# Patient Record
Sex: Female | Born: 1990 | Race: Black or African American | Hispanic: No | Marital: Single | Smoking: Former smoker
Health system: Southern US, Community
[De-identification: ages and names within clinical notes are randomized; demographics above are authoritative.]

## PROBLEM LIST (undated history)

## (undated) ENCOUNTER — Inpatient Hospital Stay (HOSPITAL_COMMUNITY): Payer: Self-pay

## (undated) DIAGNOSIS — Z87820 Personal history of traumatic brain injury: Secondary | ICD-10-CM

## (undated) DIAGNOSIS — E039 Hypothyroidism, unspecified: Secondary | ICD-10-CM

## (undated) DIAGNOSIS — F32A Depression, unspecified: Secondary | ICD-10-CM

## (undated) DIAGNOSIS — F329 Major depressive disorder, single episode, unspecified: Secondary | ICD-10-CM

## (undated) DIAGNOSIS — E042 Nontoxic multinodular goiter: Secondary | ICD-10-CM

## (undated) DIAGNOSIS — F419 Anxiety disorder, unspecified: Secondary | ICD-10-CM

## (undated) DIAGNOSIS — R0600 Dyspnea, unspecified: Secondary | ICD-10-CM

## (undated) DIAGNOSIS — Z8759 Personal history of other complications of pregnancy, childbirth and the puerperium: Secondary | ICD-10-CM

## (undated) HISTORY — DX: Major depressive disorder, single episode, unspecified: F32.9

## (undated) HISTORY — DX: Anxiety disorder, unspecified: F41.9

## (undated) HISTORY — DX: Depression, unspecified: F32.A

---
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
## 2012-11-17 ENCOUNTER — Emergency Department (HOSPITAL_COMMUNITY)
Admission: EM | Admit: 2012-11-17 | Discharge: 2012-11-17 | Payer: No Typology Code available for payment source | Attending: Emergency Medicine | Admitting: Emergency Medicine

## 2012-11-17 ENCOUNTER — Encounter (HOSPITAL_COMMUNITY): Payer: Self-pay | Admitting: Cardiology

## 2012-11-17 DIAGNOSIS — T7421XA Adult sexual abuse, confirmed, initial encounter: Secondary | ICD-10-CM | POA: Insufficient documentation

## 2012-11-17 DIAGNOSIS — IMO0002 Reserved for concepts with insufficient information to code with codable children: Secondary | ICD-10-CM | POA: Insufficient documentation

## 2012-11-17 MED ORDER — METRONIDAZOLE 500 MG PO TABS
ORAL_TABLET | ORAL | Status: AC
  Administered 2012-11-17: 2000 mg
  Filled 2012-11-17: qty 4

## 2012-11-17 MED ORDER — LEVONORGESTREL 0.75 MG PO TABS
ORAL_TABLET | ORAL | Status: AC
  Administered 2012-11-17: 0.75 mg
  Filled 2012-11-17: qty 2

## 2012-11-17 MED ORDER — AZITHROMYCIN 1 G PO PACK
PACK | ORAL | Status: AC
  Administered 2012-11-17: 1 g
  Filled 2012-11-17: qty 1

## 2012-11-17 MED ORDER — PROMETHAZINE HCL 25 MG PO TABS
ORAL_TABLET | ORAL | Status: AC
  Filled 2012-11-17: qty 3

## 2012-11-17 MED ORDER — CEFIXIME 400 MG PO TABS
ORAL_TABLET | ORAL | Status: AC
  Administered 2012-11-17: 400 mg
  Filled 2012-11-17: qty 1

## 2012-11-17 NOTE — ED Notes (Signed)
SANE nurse at bedside.
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
## 2012-11-17 NOTE — ED Provider Notes (Signed)
Medical screening examination/treatment/procedure(s) were performed by non-physician practitioner and as supervising physician I was immediately available for consultation/collaboration.  Oluwasemilore Bahl R. Kelin Borum, MD 11/17/12 2347 
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
## 2013-02-28 ENCOUNTER — Encounter: Payer: Self-pay | Admitting: Family Medicine

## 2013-02-28 ENCOUNTER — Ambulatory Visit (INDEPENDENT_AMBULATORY_CARE_PROVIDER_SITE_OTHER): Payer: Medicaid Other | Admitting: Family Medicine

## 2013-02-28 ENCOUNTER — Other Ambulatory Visit (HOSPITAL_COMMUNITY)
Admission: RE | Admit: 2013-02-28 | Discharge: 2013-02-28 | Disposition: A | Discharge status: Home / Self Care | Payer: Medicaid Other | Source: Ambulatory Visit | Attending: Family Medicine | Admitting: Family Medicine

## 2013-02-28 VITALS — BP 113/77 | HR 80 | Ht 64.0 in | Wt 189.0 lb

## 2013-02-28 DIAGNOSIS — R079 Chest pain, unspecified: Secondary | ICD-10-CM

## 2013-02-28 DIAGNOSIS — Z7251 High risk heterosexual behavior: Secondary | ICD-10-CM

## 2013-02-28 DIAGNOSIS — Z113 Encounter for screening for infections with a predominantly sexual mode of transmission: Secondary | ICD-10-CM | POA: Insufficient documentation

## 2013-02-28 DIAGNOSIS — R197 Diarrhea, unspecified: Secondary | ICD-10-CM | POA: Insufficient documentation

## 2013-02-28 DIAGNOSIS — IMO0001 Reserved for inherently not codable concepts without codable children: Secondary | ICD-10-CM

## 2013-02-28 DIAGNOSIS — Z202 Contact with and (suspected) exposure to infections with a predominantly sexual mode of transmission: Secondary | ICD-10-CM

## 2013-02-28 DIAGNOSIS — R1013 Epigastric pain: Secondary | ICD-10-CM

## 2013-02-28 DIAGNOSIS — N926 Irregular menstruation, unspecified: Secondary | ICD-10-CM | POA: Insufficient documentation

## 2013-02-28 DIAGNOSIS — Z9189 Other specified personal risk factors, not elsewhere classified: Secondary | ICD-10-CM

## 2013-02-28 DIAGNOSIS — Z309 Encounter for contraceptive management, unspecified: Secondary | ICD-10-CM

## 2013-02-28 LAB — POCT URINE PREGNANCY: Preg Test, Ur: NEGATIVE

## 2013-02-28 NOTE — Patient Instructions (Addendum)
Please return for fasting lab tests  Return in one week for another pregnancy test and your depo shot.  Take Imodium before you go to bed and after loose bowel movements

## 2013-02-28 NOTE — Assessment & Plan Note (Signed)
Not suggestive of peptic disease

## 2013-02-28 NOTE — Assessment & Plan Note (Signed)
Likely musculoskeletal. 

## 2013-02-28 NOTE — Progress Notes (Signed)
  Subjective:    Patient ID: Taylor Carson, female    DOB: 13-Jun-1991, 22 y.o.   MRN: [id number]  HPI Contraception - She'd like to restart Depo having taken it before without complications. Has been a relationship for 4 months and is planning on getting an apartment with him. They've been using condoms for contraception. She denies vaginal discharge or pelvic symptoms. She does have regular menses lasting 4-5 days with some menstrual cramps. LMP was the first week in May and they come about every 30 days.   Abdominal pain - epigastric sharp pains lasting less than one minute. Not associated with eating. Started about 4 weeks ago.   Diarrhea - Started 3 weeks ago with watery, normal colored bowel movements 3 times daily with the first awakening her at 4-5 AM. Second comes after breakfast. No ill contacts or foreign travel. She is not taking medicine for it. She is under financial stress since her mother is moving and she has to find her own place. 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   chest pain - sharp right upper CC junction discomfort not associated with the running and walking that she's been doing to lose weight. Did try push-ups x 1. No cough or shortness of breath. She's gained weight recently and is trying to lose it.    Review of Systems     Objective:   Physical Exam  Constitutional:  Generalized obesity   Neck: No thyromegaly present.  Cardiovascular: Normal rate and regular rhythm.   No murmur heard. Pulmonary/Chest: Effort normal and breath sounds normal.  Abdominal: Soft. Bowel sounds are normal. She exhibits no distension and no mass. There is no tenderness. There is no rebound and no guarding.  Neurological: She is alert.  Psychiatric: She has a normal mood and affect. Her behavior is normal. Judgment and thought content normal.          Assessment & Plan:

## 2013-02-28 NOTE — Assessment & Plan Note (Signed)
Too prolonged for viral Will see CMET results. Imodium prn

## 2013-02-28 NOTE — Assessment & Plan Note (Signed)
Begin Depo Provera next week if the repeat pregnancy test is negative.

## 2013-03-02 ENCOUNTER — Encounter: Payer: Self-pay | Admitting: Family Medicine

## 2013-03-07 ENCOUNTER — Ambulatory Visit: Payer: Medicaid Other

## 2013-06-05 ENCOUNTER — Ambulatory Visit: Payer: Medicaid Other

## 2013-07-24 ENCOUNTER — Ambulatory Visit: Payer: Medicaid Other | Admitting: Family Medicine

## 2014-05-10 ENCOUNTER — Encounter (HOSPITAL_COMMUNITY): Payer: Self-pay | Admitting: Emergency Medicine

## 2014-05-10 ENCOUNTER — Emergency Department (HOSPITAL_COMMUNITY): Payer: Medicaid Other

## 2014-05-10 ENCOUNTER — Emergency Department (HOSPITAL_COMMUNITY)
Admission: EM | Admit: 2014-05-10 | Discharge: 2014-05-10 | Disposition: A | Discharge status: Home / Self Care | Payer: Medicaid Other | Attending: Emergency Medicine | Admitting: Emergency Medicine

## 2014-05-10 DIAGNOSIS — F17200 Nicotine dependence, unspecified, uncomplicated: Secondary | ICD-10-CM | POA: Insufficient documentation

## 2014-05-10 DIAGNOSIS — R091 Pleurisy: Secondary | ICD-10-CM | POA: Insufficient documentation

## 2014-05-10 DIAGNOSIS — M25519 Pain in unspecified shoulder: Secondary | ICD-10-CM | POA: Insufficient documentation

## 2014-05-10 MED ORDER — NAPROXEN 500 MG PO TABS: 500.0000 mg | ORAL_TABLET | Freq: Two times a day (BID) | ORAL | Status: DC

## 2014-05-10 NOTE — ED Notes (Signed)
Per pt sts she started having sharp pains in her left shoulder/chest about 1 hour ago. Sts the pain is there when she breathes in and out.

## 2014-05-10 NOTE — ED Provider Notes (Signed)
CSN: [id number]     Arrival date & time 05/10/14  1118 History   First MD Initiated Contact with Patient 05/10/14 1341     Chief Complaint  Patient presents with  . Shoulder Pain    HPI Pt had pain in her left shoulder blade earlier today.  It started suddenly.  Sharp in nature but now is much better.  It was radiating down to the elbow.  The pain increased breathing.  She took an aspirin.  No nausea.  No shortness of breath.  No history of heart disease.  No OCP meds.  No prior DVT or PE.   History reviewed. No pertinent past medical history. History reviewed. No pertinent past surgical history. History reviewed. No pertinent family history. History  Substance Use Topics  . Smoking status: Current Every Day Smoker  . Smokeless tobacco: Not on file  . Alcohol Use: Yes   OB History   Grav Para Term Preterm Abortions TAB SAB Ect Mult Living                 Review of Systems  All other systems reviewed and are negative.     Allergies  Review of patient's allergies indicates no known allergies.  Home Medications   Prior to Admission medications   Medication Sig Start Date End Date Taking? Authorizing Provider  naproxen (NAPROSYN) 500 MG tablet Take 1 tablet (500 mg total) by mouth 2 (two) times daily. 05/10/14   Linwood Dibbles, MD   BP 116/55  Pulse 72  Temp(Src) 97.7 F (36.5 C) (Oral)  Resp 17  SpO2 98%  LMP 05/10/2014 Physical Exam  Nursing note and vitals reviewed. Constitutional: She appears well-developed and well-nourished. No distress.  HENT:  Head: Normocephalic and atraumatic.  Right Ear: External ear normal.  Left Ear: External ear normal.  Eyes: Conjunctivae are normal. Right eye exhibits no discharge. Left eye exhibits no discharge. No scleral icterus.  Neck: Neck supple. No tracheal deviation present.  Cardiovascular: Normal rate, regular rhythm and intact distal pulses.   Pulmonary/Chest: Effort normal and breath sounds normal. No stridor. No respiratory  distress. She has no wheezes. She has no rales.  Abdominal: Soft. Bowel sounds are normal. She exhibits no distension. There is no tenderness. There is no rebound and no guarding.  Musculoskeletal: She exhibits no edema and no tenderness.  Neurological: She is alert. She has normal strength. No cranial nerve deficit (no facial droop, extraocular movements intact, no slurred speech) or sensory deficit. She exhibits normal muscle tone. She displays no seizure activity. Coordination normal.  Skin: Skin is warm and dry. No rash noted.  Psychiatric: She has a normal mood and affect.    ED Course  Procedures (including critical care time) Labs Review Labs Reviewed - No data to display  Imaging Review Dg Chest 2 View  05/10/2014   CLINICAL DATA:  Chest pain  EXAM: CHEST  2 VIEW  COMPARISON:  May 30, 2011  FINDINGS: There is no edema or consolidation. The heart size and pulmonary vascularity are normal. No pneumothorax. No adenopathy. No bone lesions.  IMPRESSION: No abnormality noted.   Electronically Signed   By: Bretta Bang M.D.   On: 05/10/2014 14:39     EKG Interpretation   Date/Time:  Thursday May 10 2014 11:46:51 EDT Ventricular Rate:  63 PR Interval:  152 QRS Duration: 88 QT Interval:  402 QTC Calculation: 411 R Axis:   77 Text Interpretation:  Normal sinus rhythm Normal ECG No significant  change  since last tracing Confirmed by Jadaya Sommerfield  MD-J, Alexas Basulto [phone]([id number]) on 05/10/2014  1:36:43 PM      MDM   Final diagnoses:  Pleurisy    Low risk for PE.  Perc negative.  May be viral pleurisy.  DC home with nsaids.  General precautions    Linwood DibblesJon Romain Erion, MD 05/10/14 1535

## 2014-05-10 NOTE — Discharge Instructions (Signed)

## 2014-11-15 ENCOUNTER — Encounter (HOSPITAL_COMMUNITY): Payer: Self-pay | Admitting: Emergency Medicine

## 2014-11-15 ENCOUNTER — Emergency Department (HOSPITAL_COMMUNITY): Payer: Medicaid Other

## 2014-11-15 ENCOUNTER — Emergency Department (HOSPITAL_COMMUNITY)
Admission: EM | Admit: 2014-11-15 | Discharge: 2014-11-15 | Disposition: A | Discharge status: Home / Self Care | Payer: Medicaid Other | Attending: Emergency Medicine | Admitting: Emergency Medicine

## 2014-11-15 DIAGNOSIS — J069 Acute upper respiratory infection, unspecified: Secondary | ICD-10-CM | POA: Insufficient documentation

## 2014-11-15 DIAGNOSIS — Z720 Tobacco use: Secondary | ICD-10-CM | POA: Insufficient documentation

## 2014-11-15 DIAGNOSIS — Z79899 Other long term (current) drug therapy: Secondary | ICD-10-CM | POA: Insufficient documentation

## 2014-11-15 DIAGNOSIS — H6091 Unspecified otitis externa, right ear: Secondary | ICD-10-CM | POA: Insufficient documentation

## 2014-11-15 MED ORDER — ANTIPYRINE-BENZOCAINE 5.4-1.4 % OT SOLN: 3.0000 [drp] | OTIC | Status: DC | PRN

## 2014-11-15 MED ORDER — SALINE SPRAY 0.65 % NA SOLN: 1.0000 | NASAL | Status: DC | PRN

## 2014-11-15 MED ORDER — NEOMYCIN-POLYMYXIN-HC 3.5-10000-1 OT SUSP: 4.0000 [drp] | Freq: Four times a day (QID) | OTIC | Status: DC

## 2014-11-15 NOTE — Discharge Instructions (Signed)
Use nasal saline as directed along with auralgan for ear pain and cortisporin ear drops for the infection of your right ear. Do not use Q-tips. Take over-the-counter decongestants.  Cool Mist Vaporizers Vaporizers may help relieve the symptoms of a cough and cold. They add moisture to the air, which helps mucus to become thinner and less sticky. This makes it easier to breathe and cough up secretions. Cool mist vaporizers do not cause serious burns like hot mist vaporizers, which may also be called steamers or humidifiers. Vaporizers have not been proven to help with colds. You should not use a vaporizer if you are allergic to mold. HOME CARE INSTRUCTIONS  Follow the package instructions for the vaporizer.  Do not use anything other than distilled water in the vaporizer.  Do not run the vaporizer all of the time. This can cause mold or bacteria to grow in the vaporizer.  Clean the vaporizer after each time it is used.  Clean and dry the vaporizer well before storing it.  Stop using the vaporizer if worsening respiratory symptoms develop. Document Released: 06/25/2004 Document Revised: 10/03/2013 Document Reviewed: 02/15/2013 [facility] Patient Information 2015 [place], [place]. This information is not intended to replace advice given to you by your health care provider. Make sure you discuss any questions you have with your health care provider.  Otitis Externa Otitis externa is a bacterial or fungal infection of the outer ear canal. This is the area from the eardrum to the outside of the ear. Otitis externa is sometimes called "swimmer's ear." CAUSES  Possible causes of infection include:  Swimming in dirty water.  Moisture remaining in the ear after swimming or bathing.  Mild injury (trauma) to the ear.  Objects stuck in the ear (foreign body).  Cuts or scrapes (abrasions) on the outside of the ear. SIGNS AND SYMPTOMS  The first symptom of infection is often itching in the ear  canal. Later signs and symptoms may include swelling and redness of the ear canal, ear pain, and yellowish-white fluid (pus) coming from the ear. The ear pain may be worse when pulling on the earlobe. DIAGNOSIS  Your health care provider will perform a physical exam. A sample of fluid may be taken from the ear and examined for bacteria or fungi. TREATMENT  Antibiotic ear drops are often given for 10 to 14 days. Treatment may also include pain medicine or corticosteroids to reduce itching and swelling. HOME CARE INSTRUCTIONS   Apply antibiotic ear drops to the ear canal as prescribed by your health care provider.  Take medicines only as directed by your health care provider.  If you have diabetes, follow any additional treatment instructions from your health care provider.  Keep all follow-up visits as directed by your health care provider. PREVENTION   Keep your ear dry. Use the corner of a towel to absorb water out of the ear canal after swimming or bathing.  Avoid scratching or putting objects inside your ear. This can damage the ear canal or remove the protective wax that lines the canal. This makes it easier for bacteria and fungi to grow.  Avoid swimming in lakes, polluted water, or poorly chlorinated pools.  You may use ear drops made of rubbing alcohol and vinegar after swimming. Combine equal parts of white vinegar and alcohol in a bottle. Put 3 or 4 drops into each ear after swimming. SEEK MEDICAL CARE IF:   You have a fever.  Your ear is still red, swollen, painful, or draining pus  after 3 days.  Your redness, swelling, or pain gets worse.  You have a severe headache.  You have redness, swelling, pain, or tenderness in the area behind your ear. MAKE SURE YOU:   Understand these instructions.  Will watch your condition.  Will get help right away if you are not doing well or get worse. Document Released: 09/28/2005 Document Revised: 02/12/2014 Document Reviewed:  10/15/2011 [facility] Patient Information 2015 [place], [place]. This information is not intended to replace advice given to you by your health care provider. Make sure you discuss any questions you have with your health care provider.  Upper Respiratory Infection, Adult An upper respiratory infection (URI) is also sometimes known as the common cold. The upper respiratory tract includes the nose, sinuses, throat, trachea, and bronchi. Bronchi are the airways leading to the lungs. Most people improve within 1 week, but symptoms can last up to 2 weeks. A residual cough may last even longer.  CAUSES Many different viruses can infect the tissues lining the upper respiratory tract. The tissues become irritated and inflamed and often become very moist. Mucus production is also common. A cold is contagious. You can easily spread the virus to others by oral contact. This includes kissing, sharing a glass, coughing, or sneezing. Touching your mouth or nose and then touching a surface, which is then touched by another person, can also spread the virus. SYMPTOMS  Symptoms typically develop 1 to 3 days after you come in contact with a cold virus. Symptoms vary from person to person. They may include:  Runny nose.  Sneezing.  Nasal congestion.  Sinus irritation.  Sore throat.  Loss of voice (laryngitis).  Cough.  Fatigue.  Muscle aches.  Loss of appetite.  Headache.  Low-grade fever. DIAGNOSIS  You might diagnose your own cold based on familiar symptoms, since most people get a cold 2 to 3 times a year. Your caregiver can confirm this based on your exam. Most importantly, your caregiver can check that your symptoms are not due to another disease such as strep throat, sinusitis, pneumonia, asthma, or epiglottitis. Blood tests, throat tests, and X-rays are not necessary to diagnose a common cold, but they may sometimes be helpful in excluding other more serious diseases. Your caregiver will decide if  any further tests are required. RISKS AND COMPLICATIONS  You may be at risk for a more severe case of the common cold if you smoke cigarettes, have chronic heart disease (such as heart failure) or lung disease (such as asthma), or if you have a weakened immune system. The very young and very old are also at risk for more serious infections. Bacterial sinusitis, middle ear infections, and bacterial pneumonia can complicate the common cold. The common cold can worsen asthma and chronic obstructive pulmonary disease (COPD). Sometimes, these complications can require emergency medical care and may be life-threatening. PREVENTION  The best way to protect against getting a cold is to practice good hygiene. Avoid oral or hand contact with people with cold symptoms. Wash your hands often if contact occurs. There is no clear evidence that vitamin C, vitamin E, echinacea, or exercise reduces the chance of developing a cold. However, it is always recommended to get plenty of rest and practice good nutrition. TREATMENT  Treatment is directed at relieving symptoms. There is no cure. Antibiotics are not effective, because the infection is caused by a virus, not by bacteria. Treatment may include:  Increased fluid intake. Sports drinks offer valuable electrolytes, sugars, and fluids.  Breathing heated mist or steam (vaporizer or shower).  Eating chicken soup or other clear broths, and maintaining good nutrition.  Getting plenty of rest.  Using gargles or lozenges for comfort.  Controlling fevers with ibuprofen or acetaminophen as directed by your caregiver.  Increasing usage of your inhaler if you have asthma. Zinc gel and zinc lozenges, taken in the first 24 hours of the common cold, can shorten the duration and lessen the severity of symptoms. Pain medicines may help with fever, muscle aches, and throat pain. A variety of non-prescription medicines are available to treat congestion and runny nose. Your  caregiver can make recommendations and may suggest nasal or lung inhalers for other symptoms.  HOME CARE INSTRUCTIONS   Only take over-the-counter or prescription medicines for pain, discomfort, or fever as directed by your caregiver.  Use a warm mist humidifier or inhale steam from a shower to increase air moisture. This may keep secretions moist and make it easier to breathe.  Drink enough water and fluids to keep your urine clear or pale yellow.  Rest as needed.  Return to work when your temperature has returned to normal or as your caregiver advises. You may need to stay home longer to avoid infecting others. You can also use a face mask and careful hand washing to prevent spread of the virus. SEEK MEDICAL CARE IF:   After the first few days, you feel you are getting worse rather than better.  You need your caregiver's advice about medicines to control symptoms.  You develop chills, worsening shortness of breath, or brown or red sputum. These may be signs of pneumonia.  You develop yellow or brown nasal discharge or pain in the face, especially when you bend forward. These may be signs of sinusitis.  You develop a fever, swollen neck glands, pain with swallowing, or white areas in the back of your throat. These may be signs of strep throat. SEEK IMMEDIATE MEDICAL CARE IF:   You have a fever.  You develop severe or persistent headache, ear pain, sinus pain, or chest pain.  You develop wheezing, a prolonged cough, cough up blood, or have a change in your usual mucus (if you have chronic lung disease).  You develop sore muscles or a stiff neck. Document Released: 03/24/2001 Document Revised: 12/21/2011 Document Reviewed: 01/03/2014 [facility] Patient Information 2015 [place], [place]. This information is not intended to replace advice given to you by your health care provider. Make sure you discuss any questions you have with your health care provider.

## 2014-11-15 NOTE — ED Notes (Signed)
Pt sts URI sx with cough x several weeks worse today with right ear pain and some blood in sputum with cough

## 2014-11-15 NOTE — ED Provider Notes (Signed)
CSN: [id number]     Arrival date & time 11/15/14  1731 History   First MD Initiated Contact with Patient 11/15/14 1807     Chief Complaint  Patient presents with  . URI  . Otalgia     (Consider location/radiation/quality/duration/timing/severity/associated sxs/prior Treatment) HPI Comments: 24 y/o F presenting with cough and nasal congestion x 4 days. Cough is productive with bloody sputum. Denies CP or SOB. States she is congested. Denies fever or chills. She also endorses R ear pain x 1 day unrelieved by OTC ear pain drops. Denies drainage.  Patient is a 24 y.o. female presenting with URI and ear pain. The history is provided by the patient.  URI Presenting symptoms: congestion, cough and ear pain   Otalgia Associated symptoms: congestion and cough     History reviewed. No pertinent past medical history. History reviewed. No pertinent past surgical history. History reviewed. No pertinent family history. History  Substance Use Topics  . Smoking status: Current Every Day Smoker  . Smokeless tobacco: Not on file  . Alcohol Use: Yes   OB History    No data available     Review of Systems  HENT: Positive for congestion and ear pain.   Respiratory: Positive for cough.   All other systems reviewed and are negative.     Allergies  Review of patient's allergies indicates no known allergies.  Home Medications   Prior to Admission medications   Medication Sig Start Date End Date Taking? Authorizing Provider  antipyrine-benzocaine Lyla Son) otic solution Place 3-4 drops into the right ear every 2 (two) hours as needed for ear pain. 11/15/14   Kathrynn Speed, PA-C  naproxen (NAPROSYN) 500 MG tablet Take 1 tablet (500 mg total) by mouth 2 (two) times daily. 05/10/14   Linwood Dibbles, MD  neomycin-polymyxin-hydrocortisone (CORTISPORIN) 3.5-10000-1 otic suspension Place 4 drops into the right ear 4 (four) times daily. X 7 days 11/15/14   Kathrynn Speed, PA-C  sodium chloride (OCEAN) 0.65 %  SOLN nasal spray Place 1 spray into both nostrils as needed for congestion. 11/15/14   Maja Mccaffery M Farris Blash, PA-C   BP 136/67 mmHg  Pulse 103  Temp(Src) 98.2 F (36.8 C) (Oral)  Resp 16  SpO2 99%  LMP 11/05/2014 Physical Exam  Constitutional: She is oriented to person, place, and time. She appears well-developed and well-nourished. No distress.  HENT:  Head: Normocephalic and atraumatic.  R ear canal inflamed. TM normal. L ear canal and TM normal. Nasal mucosal edema, dried blood in nares. Post nasal drip.  Eyes: Conjunctivae and EOM are normal.  Neck: Normal range of motion. Neck supple.  Cardiovascular: Normal rate, regular rhythm and normal heart sounds.   Pulmonary/Chest: Effort normal and breath sounds normal. No respiratory distress.  Musculoskeletal: Normal range of motion. She exhibits no edema.  Lymphadenopathy:    She has no cervical adenopathy.  Neurological: She is alert and oriented to person, place, and time. No sensory deficit.  Skin: Skin is warm and dry.  Psychiatric: She has a normal mood and affect. Her behavior is normal.  Nursing note and vitals reviewed.   ED Course  Procedures (including critical care time) Labs Review Labs Reviewed - No data to display  Imaging Review Dg Chest 2 View (if Patient Has Fever And/or Copd)  11/15/2014   CLINICAL DATA:  Duct of cough for 1 week.  Hemoptysis.  EXAM: CHEST  2 VIEW  COMPARISON:  May 10, 2014.  FINDINGS: The heart size and mediastinal  contours are within normal limits. Both lungs are clear. No pneumothorax or pleural effusion is noted. The visualized skeletal structures are unremarkable.  IMPRESSION: No acute cardiopulmonary abnormality seen.   Electronically Signed   By: Roque LiasJames  Green M.D.   On: 11/15/2014 18:32     EKG Interpretation None      MDM   Final diagnoses:  Otitis externa, right  URI (upper respiratory infection)   Pt non-toxic appearing and in NAD. AFVSS. No tachycardia on my exam. CXR obtained in  triage prior to pt being seen. No acute findings. Bloody sputum most likely from nasal congestion and dried blood in nares. Lungs clear on exam. Treat with nasal saline, cool-mist humidifiers. ROE tx with cortisporin drops. F/u with PCP. Stable for d/c. Return precautions given. Patient states understanding of treatment care plan and is agreeable.    Kathrynn SpeedRobyn M Makyla Bye, PA-C 11/15/14 Carlis [id number]  Gerhard Munchobert Lockwood, MD 11/15/14 2016

## 2015-05-24 ENCOUNTER — Encounter: Payer: Self-pay | Admitting: Obstetrics and Gynecology

## 2015-05-24 ENCOUNTER — Ambulatory Visit (INDEPENDENT_AMBULATORY_CARE_PROVIDER_SITE_OTHER): Payer: 59 | Admitting: Obstetrics and Gynecology

## 2015-05-24 VITALS — BP 145/80 | HR 86 | Temp 98.4°F | Wt 226.0 lb

## 2015-05-24 DIAGNOSIS — L03818 Cellulitis of other sites: Secondary | ICD-10-CM

## 2015-05-24 MED ORDER — CLINDAMYCIN HCL 300 MG PO CAPS: 300.0000 mg | ORAL_CAPSULE | Freq: Three times a day (TID) | ORAL | Status: DC

## 2015-05-24 NOTE — Patient Instructions (Signed)

## 2015-05-24 NOTE — Progress Notes (Signed)
   Subjective:   Patient ID: Taylor Carson, female    DOB: 11/18/1990, 24 y.o.   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   Past medical history, surgical, family, and social history reviewed and updated in the EMR as appropriate.  Objective:  BP 145/80 mmHg  Pulse 86  Temp(Src) 98.4 F (36.9 C) (Oral)  Wt 226 lb (102.513 kg)  Physical Exam  Constitutional: She is well-developed, well-nourished, and in no distress.  Skin:       Assessment & Plan:  Cellulitis of R thigh - concern for cellulitis with warmth, swelling, and erythema along with the rapid expansion of area in the last 2 days. Patient afebrile and vitals are stable.   -Rx for clindamycin given -area outlined and patient instructed to watch for expansion of borders -handout given -return precautions discussed -RTC in about 2 weeks to see if areas has resolved   Caryl Ada, DO 05/24/2015, 3:35 PM PGY-2, [facility]

## 2015-05-27 ENCOUNTER — Encounter: Payer: Self-pay | Admitting: Family Medicine

## 2015-05-27 ENCOUNTER — Ambulatory Visit (INDEPENDENT_AMBULATORY_CARE_PROVIDER_SITE_OTHER): Payer: 59 | Admitting: Family Medicine

## 2015-05-27 ENCOUNTER — Other Ambulatory Visit (HOSPITAL_COMMUNITY)
Admission: RE | Admit: 2015-05-27 | Discharge: 2015-05-27 | Disposition: A | Discharge status: Home / Self Care | Payer: 59 | Source: Ambulatory Visit | Attending: Family Medicine | Admitting: Family Medicine

## 2015-05-27 VITALS — BP 139/68 | HR 80 | Temp 98.2°F | Wt 226.0 lb

## 2015-05-27 DIAGNOSIS — Z124 Encounter for screening for malignant neoplasm of cervix: Secondary | ICD-10-CM | POA: Diagnosis not present

## 2015-05-27 DIAGNOSIS — Z113 Encounter for screening for infections with a predominantly sexual mode of transmission: Secondary | ICD-10-CM | POA: Diagnosis present

## 2015-05-27 DIAGNOSIS — Z01419 Encounter for gynecological examination (general) (routine) without abnormal findings: Secondary | ICD-10-CM | POA: Insufficient documentation

## 2015-05-27 DIAGNOSIS — Z1151 Encounter for screening for human papillomavirus (HPV): Secondary | ICD-10-CM | POA: Diagnosis present

## 2015-05-27 DIAGNOSIS — N898 Other specified noninflammatory disorders of vagina: Secondary | ICD-10-CM

## 2015-05-27 DIAGNOSIS — F17200 Nicotine dependence, unspecified, uncomplicated: Secondary | ICD-10-CM | POA: Insufficient documentation

## 2015-05-27 DIAGNOSIS — F17210 Nicotine dependence, cigarettes, uncomplicated: Secondary | ICD-10-CM

## 2015-05-27 DIAGNOSIS — Z3042 Encounter for surveillance of injectable contraceptive: Secondary | ICD-10-CM | POA: Diagnosis not present

## 2015-05-27 DIAGNOSIS — N760 Acute vaginitis: Secondary | ICD-10-CM

## 2015-05-27 DIAGNOSIS — B9689 Other specified bacterial agents as the cause of diseases classified elsewhere: Secondary | ICD-10-CM | POA: Insufficient documentation

## 2015-05-27 LAB — POCT URINE PREGNANCY: PREG TEST UR: NEGATIVE

## 2015-05-27 LAB — POCT WET PREP (WET MOUNT): Clue Cells Wet Prep Whiff POC: POSITIVE

## 2015-05-27 MED ORDER — MEDROXYPROGESTERONE ACETATE 150 MG/ML IM SUSP
150.0000 mg | Freq: Once | INTRAMUSCULAR | Status: AC
  Administered 2015-05-27: 150 mg via INTRAMUSCULAR

## 2015-05-27 MED ORDER — NICOTINE POLACRILEX 2 MG MT GUM: 2.0000 mg | CHEWING_GUM | OROMUCOSAL | Status: DC | PRN

## 2015-05-27 NOTE — Assessment & Plan Note (Signed)
Patient had vaginal irritation one month ago and requests STD testing. -wet prep, GC/Chlam, HIV, RPR, Hepatitis panel completed and results pending at discharge from office

## 2015-05-27 NOTE — Assessment & Plan Note (Signed)
Patient interested in smoking cessation -prescription for nicotine gum provided to the patient -return in one month for follow up

## 2015-05-27 NOTE — Assessment & Plan Note (Signed)
Patient presents for contraception management. Negative pregnancy test. -Depo-Provera provided -Patient to return in 3 months for repeat injection

## 2015-05-27 NOTE — Progress Notes (Signed)
   Subjective:    Patient ID: Taylor Carson, female    DOB: [birth year], 24 y.o.   MRN: [id number]  HPI 24 year old female presents for birth control counseling. She is interested in restarting Depo-Provera.  Not interested in Nexplanon or IUD, thinks she will forget to take OCP's, no history of blood clots, no family history of blood clots. Unsure of LMP however sometime in the middle of July.   Vaginal itching - some discharge one month ago, no current symptoms, no dysuria, no stomach pain, no nausea/emesis, would like STD check, last sexual activity was yesterday (female partner), has been with current partner for 4 months, uses condoms every time  Social - smokes 4-5 per day, interested in cutting down, has tried cold [place]  Review of Systems No fevers or chills    Objective:   Physical Exam Vitals: Reviewed Cardiac: Regular rate and rhythm, S1 and S2 present, no murmurs Respiratory: Clear to patient bilaterally, normal effort GU: Chaperone present, normal external female genitalia, speculum examination identified mild amount of white vaginal mucus, wet prep and GC chlamydia obtained. Pap smear also completed. Cervix appear normal. Bimanual exam was unremarkable.  Point-of-care pregnancy test negative     Assessment & Plan:  Please see problem specific assessment and plan.

## 2015-05-27 NOTE — Patient Instructions (Signed)
It was nice to see you today.  Dr. Randolm Idol will call you with your test results.  Congratulations on choosing to stop smoking. Dr. Randolm Idol has sent a prescription for nicotine gum to the pharmacy. Use the gum when you would normally smoke.  Please follow up in one month to follow up on your smoking.

## 2015-05-28 ENCOUNTER — Telehealth: Payer: Self-pay | Admitting: Family Medicine

## 2015-05-28 DIAGNOSIS — B9689 Other specified bacterial agents as the cause of diseases classified elsewhere: Secondary | ICD-10-CM

## 2015-05-28 DIAGNOSIS — Z0000 Encounter for general adult medical examination without abnormal findings: Secondary | ICD-10-CM

## 2015-05-28 DIAGNOSIS — N760 Acute vaginitis: Principal | ICD-10-CM

## 2015-05-28 LAB — ACUTE HEP PANEL AND HEP B SURFACE AB
HCV Ab: NEGATIVE
HEP B C IGM: NONREACTIVE
HEP B S AB: POSITIVE — AB
HEP B S AG: NEGATIVE
Hep A IgM: NONREACTIVE

## 2015-05-28 LAB — CERVICOVAGINAL ANCILLARY ONLY
Chlamydia: NEGATIVE
Neisseria Gonorrhea: NEGATIVE

## 2015-05-28 LAB — HIV ANTIBODY (ROUTINE TESTING W REFLEX): HIV: NONREACTIVE

## 2015-05-28 LAB — RPR

## 2015-05-28 LAB — CYTOLOGY - PAP

## 2015-05-28 NOTE — Telephone Encounter (Signed)
Attempted to contact patient on her cell phone with no answer.

## 2015-05-29 NOTE — Telephone Encounter (Signed)
Attempted to contact patient, no answer.

## 2015-05-30 NOTE — Telephone Encounter (Signed)
Pt is returning Dr. Fletke's call. jw °

## 2015-05-30 NOTE — Telephone Encounter (Signed)
Attempted to contact patient with results, left message to return call

## 2015-05-31 ENCOUNTER — Encounter: Payer: Self-pay | Admitting: Family Medicine

## 2015-05-31 DIAGNOSIS — Z0000 Encounter for general adult medical examination without abnormal findings: Secondary | ICD-10-CM | POA: Insufficient documentation

## 2015-05-31 MED ORDER — METRONIDAZOLE 500 MG PO TABS: 500.0000 mg | ORAL_TABLET | Freq: Two times a day (BID) | ORAL | Status: DC

## 2015-05-31 NOTE — Assessment & Plan Note (Signed)
Wet prep positive for BV -script for Flagyl 500 mg BID for 7 days sent to pharmacy

## 2015-05-31 NOTE — Telephone Encounter (Signed)
No answer, will send letter.

## 2015-05-31 NOTE — Telephone Encounter (Signed)
Attempted to return call, went straight to voicemail.

## 2015-10-24 ENCOUNTER — Encounter (HOSPITAL_COMMUNITY): Payer: Self-pay | Admitting: Emergency Medicine

## 2015-10-24 ENCOUNTER — Emergency Department (HOSPITAL_COMMUNITY)
Admission: EM | Admit: 2015-10-24 | Discharge: 2015-10-24 | Disposition: A | Discharge status: Home / Self Care | Payer: Self-pay | Source: Home / Self Care | Attending: Family Medicine | Admitting: Family Medicine

## 2015-10-24 DIAGNOSIS — E349 Endocrine disorder, unspecified: Secondary | ICD-10-CM

## 2015-10-24 DIAGNOSIS — R519 Headache, unspecified: Secondary | ICD-10-CM

## 2015-10-24 DIAGNOSIS — G479 Sleep disorder, unspecified: Secondary | ICD-10-CM

## 2015-10-24 DIAGNOSIS — R51 Headache: Secondary | ICD-10-CM

## 2015-10-24 LAB — POCT URINALYSIS DIP (DEVICE)
Glucose, UA: NEGATIVE mg/dL
Hgb urine dipstick: NEGATIVE
Leukocytes, UA: NEGATIVE
Nitrite: NEGATIVE
PH: 6 (ref 5.0–8.0)
PROTEIN: NEGATIVE mg/dL
Urobilinogen, UA: 0.2 mg/dL (ref 0.0–1.0)

## 2015-10-24 LAB — POCT RAPID STREP A: Streptococcus, Group A Screen (Direct): NEGATIVE

## 2015-10-24 NOTE — ED Provider Notes (Signed)
CSN: [id number]     Arrival date & time 10/24/15  1307 History   First MD Initiated Contact with Patient 10/24/15 1409     Chief Complaint  Patient presents with  . Possible Pregnancy   (Consider location/radiation/quality/duration/timing/severity/associated sxs/prior Treatment) HPI Comments: 25 year old female states she is here for a pregnancy test. She currently is utilizing Depo for apparent birth control. She has been using it since 25 years old. She has been doing relatively well on this medication until recently. She is now having problems sleeping, not feeling just right, some tenderness and soreness to the breasts, occasional nausea, episode of vomiting for just a few days 2-3 weeks ago. She is also complaining of a dull and sharp discomfort that occurs intermittently and periodically to the left lateral abdomen along the anterior and mid axillary line.    History reviewed. No pertinent past medical history. History reviewed. No pertinent past surgical history. No family history on file. Social History  Substance Use Topics  . Smoking status: Current Every Day Smoker  . Smokeless tobacco: None  . Alcohol Use: Yes   OB History    No data available     Review of Systems  Constitutional: Negative for fever, diaphoresis and activity change.  HENT:       Denies ENT symptoms. Denies cold symptoms.  Respiratory: Negative.   Cardiovascular: Negative for chest pain.  Gastrointestinal:       As per history of present illness. Currently no abdominal pain, nausea or vomiting.  Genitourinary: Negative for dysuria, urgency, frequency and vaginal discharge.       Denies significant abnormal uterine bleeding. Approximately 2-3 weeks ago she had a small amount of brownish discharge but no bright red bleeding.  Musculoskeletal: Negative.   Skin: Negative.   Neurological: Positive for headaches. Negative for syncope and speech difficulty.  Psychiatric/Behavioral: Positive for sleep  disturbance.    Allergies  Review of patient's allergies indicates no known allergies.  Home Medications   Prior to Admission medications   Medication Sig Start Date End Date Taking? Authorizing Provider  clindamycin (CLEOCIN) 300 MG capsule Take 1 capsule (300 mg total) by mouth 3 (three) times daily. Patient not taking: Reported on 10/24/2015 05/24/15   Pincus LargeJazma Y Phelps, DO  metroNIDAZOLE (FLAGYL) 500 MG tablet Take 1 tablet (500 mg total) by mouth 2 (two) times daily. Patient not taking: Reported on 10/24/2015 05/31/15   Uvaldo RisingKyle J Fletke, MD  nicotine polacrilex (NICORETTE) 2 MG gum Take 1 each (2 mg total) by mouth as needed for smoking cessation. 05/27/15   Uvaldo RisingKyle J Fletke, MD   Meds Ordered and Administered this Visit  Medications - No data to display  BP 118/88 mmHg  Pulse 76  Temp(Src) 99.4 F (37.4 C) (Oral)  Resp 18  SpO2 96%  LMP 10/10/2015 No data found.   Physical Exam  Constitutional: She appears well-developed and well-nourished. No distress.  Sitting on the exam table. Most of her concentration and focus is on use of her telephone. Showing no signs of distress.  Eyes: EOM are normal.  Neck: Normal range of motion. Neck supple.  Cardiovascular: Normal rate, regular rhythm and normal heart sounds.   Pulmonary/Chest: Effort normal and breath sounds normal. No respiratory distress. She has no wheezes.  Abdominal: Soft. She exhibits no mass. There is no tenderness. There is no rebound and no guarding.  Musculoskeletal: She exhibits no edema.  Neurological: She is alert. She exhibits normal muscle tone. Coordination normal.  Skin: Skin  is warm and dry.  Psychiatric: She has a normal mood and affect. Her behavior is normal.  Nursing note and vitals reviewed.   ED Course  Procedures (including critical care time)  Labs Review Labs Reviewed  POCT URINALYSIS DIP (DEVICE) - Abnormal; Notable for the following:    Bilirubin Urine SMALL (*)    Ketones, ur TRACE (*)    All  other components within normal limits   Results for orders placed or performed during the hospital encounter of 10/24/15  POCT urinalysis dip (device)  Result Value Ref Range   Glucose, UA NEGATIVE NEGATIVE mg/dL   Bilirubin Urine SMALL (A) NEGATIVE   Ketones, ur TRACE (A) NEGATIVE mg/dL   Specific Gravity, Urine >=1.030 1.005 - 1.030   Hgb urine dipstick NEGATIVE NEGATIVE   pH 6.0 5.0 - 8.0   Protein, ur NEGATIVE NEGATIVE mg/dL   Urobilinogen, UA 0.2 0.0 - 1.0 mg/dL   Nitrite NEGATIVE NEGATIVE   Leukocytes, UA NEGATIVE NEGATIVE     Imaging Review No results found.   Visual Acuity Review  Right Eye Distance:   Left Eye Distance:   Bilateral Distance:    Right Eye Near:   Left Eye Near:    Bilateral Near:         MDM   1. Hormonal disorder   2. Nonintractable headache, unspecified chronicity pattern, unspecified headache type   3. Sleep disorder    For drainage recommend taking the Zyrtec prescription which is a liquid. Take daily. The drainage is causing your throat to be quite sore and irritated. May use Cepacol lozenges to help with sore throat pain. In addition ibuprofen helps with sore throat pain. Continue to drink plenty of fluids and stay well-hydrated A copy of side effects and other information regarding the Depo injections are given.    Hayden Rasmussen, NP 10/24/15 1441

## 2015-10-24 NOTE — ED Provider Notes (Signed)
CSN: [id number]     Arrival date & time 10/24/15  1307 History   None    Chief Complaint  Patient presents with  . Possible Pregnancy   (Consider location/radiation/quality/duration/timing/severity/associated sxs/prior Treatment) HPI  History reviewed. No pertinent past medical history. History reviewed. No pertinent past surgical history. No family history on file. Social History  Substance Use Topics  . Smoking status: Current Every Day Smoker  . Smokeless tobacco: None  . Alcohol Use: Yes   OB History    No data available     Review of Systems  Allergies  Review of patient's allergies indicates no known allergies.  Home Medications   Prior to Admission medications   Medication Sig Start Date End Date Taking? Authorizing Provider  clindamycin (CLEOCIN) 300 MG capsule Take 1 capsule (300 mg total) by mouth 3 (three) times daily. Patient not taking: Reported on 10/24/2015 05/24/15   Pincus LargeJazma Y Phelps, DO  metroNIDAZOLE (FLAGYL) 500 MG tablet Take 1 tablet (500 mg total) by mouth 2 (two) times daily. Patient not taking: Reported on 10/24/2015 05/31/15   Uvaldo RisingKyle J Fletke, MD  nicotine polacrilex (NICORETTE) 2 MG gum Take 1 each (2 mg total) by mouth as needed for smoking cessation. 05/27/15   Uvaldo RisingKyle J Fletke, MD   Meds Ordered and Administered this Visit  Medications - No data to display  BP 118/88 mmHg  Pulse 76  Temp(Src) 99.4 F (37.4 C) (Oral)  Resp 18  SpO2 96%  LMP 10/10/2015 No data found.   Physical Exam  ED Course  Procedures (including critical care time)  Labs Review Labs Reviewed  POCT URINALYSIS DIP (DEVICE) - Abnormal; Notable for the following:    Bilirubin Urine SMALL (*)    Ketones, ur TRACE (*)    All other components within normal limits  POCT RAPID STREP A    Imaging Review No results found.   Visual Acuity Review  Right Eye Distance:   Left Eye Distance:   Bilateral Distance:    Right Eye Near:   Left Eye Near:    Bilateral Near:          MDM   1. Hormonal disorder   2. Nonintractable headache, unspecified chronicity pattern, unspecified headache type   3. Sleep disorder        Linna HoffJames D Lalitha Ilyas, MD 11/23/15 [phone]

## 2015-10-24 NOTE — Discharge Instructions (Signed)
It is recommended that she follow-up with your primary care doctor who is ordering your Depakote injections. There are many and varied side effects that occur initially and at a later time. You may also need additional lab work and other studies that are not available at the urgent care. Her urinalysis is negative for infection. It does show that you are a little dehydrated and needs to drink more fluids. Her pregnancy test is negative. For worsening, new symptoms or problems may need to go to the emergency department. Otherwise follow-up with your primary care doctor and call early for an appointment.

## 2015-10-24 NOTE — ED Notes (Signed)
Patient reports she has been on depo shot since august/september 2016.  Patient reports being regular with injections.  Patient reports 2 weeks ago having a light, brownish "period" .  Episode not typical for patient's periods.  Patient has noticed random breast soreness, headaches .  Patient has performed 3 home preg tests one week apart, all three negative.

## 2016-03-12 ENCOUNTER — Encounter: Payer: Self-pay | Admitting: Family Medicine

## 2016-03-12 ENCOUNTER — Ambulatory Visit (INDEPENDENT_AMBULATORY_CARE_PROVIDER_SITE_OTHER): Payer: Medicaid Other | Admitting: Family Medicine

## 2016-03-12 VITALS — BP 117/79 | HR 71 | Temp 97.3°F | Ht 64.0 in | Wt 209.8 lb

## 2016-03-12 DIAGNOSIS — L309 Dermatitis, unspecified: Secondary | ICD-10-CM | POA: Diagnosis not present

## 2016-03-12 DIAGNOSIS — Z3009 Encounter for other general counseling and advice on contraception: Secondary | ICD-10-CM | POA: Diagnosis present

## 2016-03-12 DIAGNOSIS — Z309 Encounter for contraceptive management, unspecified: Secondary | ICD-10-CM | POA: Diagnosis present

## 2016-03-12 DIAGNOSIS — F17210 Nicotine dependence, cigarettes, uncomplicated: Secondary | ICD-10-CM

## 2016-03-12 LAB — POCT URINE PREGNANCY: Preg Test, Ur: NEGATIVE

## 2016-03-12 MED ORDER — HYDROCORTISONE 1 % EX OINT: 1.0000 "application " | TOPICAL_OINTMENT | Freq: Two times a day (BID) | CUTANEOUS | Status: DC

## 2016-03-12 MED ORDER — NICOTINE POLACRILEX 2 MG MT GUM: 2.0000 mg | CHEWING_GUM | OROMUCOSAL | Status: DC | PRN

## 2016-03-12 NOTE — Assessment & Plan Note (Signed)
Patient presents for contraception counseling.  -Recommend scheduling appointment for Nexplanon placement in a few weeks.  -Possible side effects discussed with patient.

## 2016-03-12 NOTE — Assessment & Plan Note (Signed)
Intermittent flares of feet and extremities.  -Apply Hydrocortisone 1% ointment on affected areas.

## 2016-03-12 NOTE — Patient Instructions (Addendum)
It was nice to see you today.  Birth Control - please schedule a follow up for Nexplanon placement  Eczema - continue to use daily lotions/creams, may apply steroid cream as needed to severe areas.   Smoking - please pick up your nicotine gum to stop smoking

## 2016-03-12 NOTE — Assessment & Plan Note (Signed)
Patient interested in smoking cessation. -Recommend trying Nicorette 2 MG gum by mouth as needed for smoking cessation

## 2016-03-12 NOTE — Progress Notes (Signed)
Subjective:     Patient ID: Taylor Carson, female   DOB: 11/23/1990, 23 y.o.   MRN: [id number]  HPI  Taylor Carson is a 25 y.o. Female who presents to clinic for birth control.   Acute concerns:   Birth Control: History of Depo shots on and off since age 25 but is interested in trying different BC. She is interested in trying Nexplanon. She says Depo shot caused her to gain some weight and thinks it affected her mood. Has intermittent nausea but she says this is normal. Sexually active with current boyfriend only. History of Trich 3-4 years ago but was treated with no complications. Does not use condoms. Does smoke a pack of cigarettes every 3 days but is interested in smoking cessation. Last period was on the 12th of this month and is regular. Is concerned because her breasts have been tender since Sunday. Has mild intermittent SOB for past two weeks. No history of blood clots. No history of pregnancy. Denies vaginal discharge, itchiness, or pain.  Eczema: Has had recent problems with eczema and would like to be put on a cream if possible.   Smoking: Smokes a pack every three days but is interested in quitting.   Review of Systems Normal other than stated in HPI.    Objective:   Physical Exam Filed Vitals:   03/12/16 1049  BP: 117/79  Pulse: 71  Temp: 97.3 F (36.3 C)   Blood pressure 117/79, pulse 71, temperature 97.3 F (36.3 C), temperature source Oral, height 5\' 4"  (1.626 m), weight 209 lb 12.8 oz (95.165 kg), last menstrual period 02/21/2016.  Gen: Pleasant, conversant, healthy appearing young woman. HEENT: normocephalic, PERR, EOMI, MMM Pulm: CTAB, no labored breathing CV: RRR, no MRG Skin: Mild eczema distributed on feet and arms  HCG test: Negative    Assessment:  Contraception management Patient presents for contraception counseling.  -Recommend scheduling appointment for Nexplanon placement in a few weeks.  -Possible side effects discussed with patient.    Eczema Intermittent flares of feet and extremities.  -Apply Hydrocortisone 1% ointment on affected areas.  Nicotine dependence Patient interested in smoking cessation. -Recommend trying Nicorette 2 MG gum by mouth as needed for smoking cessation

## 2016-04-03 ENCOUNTER — Ambulatory Visit: Payer: Medicaid Other | Admitting: Family Medicine

## 2016-04-09 ENCOUNTER — Ambulatory Visit: Payer: Medicaid Other

## 2016-09-24 ENCOUNTER — Ambulatory Visit (INDEPENDENT_AMBULATORY_CARE_PROVIDER_SITE_OTHER): Payer: Self-pay | Admitting: Family Medicine

## 2016-09-24 ENCOUNTER — Encounter: Payer: Self-pay | Admitting: Family Medicine

## 2016-09-24 VITALS — BP 120/70 | HR 80 | Temp 98.3°F | Ht 64.0 in | Wt 224.0 lb

## 2016-09-24 DIAGNOSIS — Z3200 Encounter for pregnancy test, result unknown: Secondary | ICD-10-CM

## 2016-09-24 LAB — POCT URINE PREGNANCY: Preg Test, Ur: NEGATIVE

## 2016-09-24 NOTE — Progress Notes (Signed)
    Subjective: ZO:[id number]:possible pregnancy  HPI: Patient is a 25 y.o. female presenting to clinic today for possible pregnanc.  She notes she's always had very regular periods and would bleed x 4 days. Her last normal menses was 08/22/16. She notes that Friday, 09/18/16, she had some light pink spotting, an episode of heavier bleeding later that day, and has continued to have mild spotting off an on since then. She believes she could be pregnant. She has not taken a home pregnancy test but notes that she has gained a lot more weight recently. She is accompanied by her boyfriend who is supportive. They were not trying to conceive but are excited about the possibility of a pregnancy.   Social History: last smoked cigarettes 08/25/16  Flu Vaccine: declined flu vaccine today   ROS: All other systems reviewed and are negative.  Past Medical History Patient Active Problem List   Diagnosis Date Noted  . Eczema 03/12/2016  . Healthcare maintenance 05/31/2015  . Bacterial vaginosis 05/27/2015  . Nicotine dependence 05/27/2015  . Contraception management 02/28/2013  . Abdominal pain, epigastric 02/28/2013  . Diarrhea 02/28/2013  . Possible exposure to STD 02/28/2013  . Chest pain 02/28/2013    Medications- reviewed and updated  Objective: Office vital signs reviewed. BP 120/70   Pulse 80   Temp 98.3 F (36.8 C) (Oral)   Ht 5\' 4"  (1.626 m)   Wt 224 lb (101.6 kg)   BMI 38.45 kg/m    Physical Examination:  General: Awake, alert, well- nourished, NAD Cardio: RRR, no m/r/g noted.  Pulm: No increased WOB.  CTAB, without wheezes, rhonchi or crackles noted.  GI: soft, NT/ND,+BS x4, no hepatomegaly, no splenomegaly.  Urine pregnancy: negative  Assessment/Plan: No problem-specific Assessment & Plan notes found for this encounter. Possible pregnancy: urine pregnancy negative today. We discussed that oftentimes cycles can change with weight gain, stress, etc. When asked if they were going to  attempt to conceive (pt seemed a little let down at a negative pregnancy test), they replied no. We discussed contraception, however the patient is not interested in any form of birth control currently. We discussed ACOG guidelines that women of reproductive age should take prenatal vitamins with folic acid. No other concerns at this time.   Orders Placed This Encounter  Procedures  . POCT urine pregnancy    No orders of the defined types were placed in this encounter.   Joanna Puffrystal S. Dorsey PGY-3, [facility]

## 2016-09-24 NOTE — Patient Instructions (Addendum)
It was nice to meet you. Your pregnancy test was negative.  If you change your mind about birth control, let [facility] know. If you would like to really start trying to have a baby in the future, I'd recommend starting pre-natal vitamins with folic acid in them.  Try calling Medicaid and getting your insurance switched over.

## 2017-03-12 DIAGNOSIS — Z8759 Personal history of other complications of pregnancy, childbirth and the puerperium: Secondary | ICD-10-CM

## 2017-03-12 HISTORY — DX: Personal history of other complications of pregnancy, childbirth and the puerperium: Z87.59

## 2017-04-08 ENCOUNTER — Inpatient Hospital Stay (HOSPITAL_COMMUNITY): Payer: Self-pay

## 2017-04-08 ENCOUNTER — Encounter (HOSPITAL_COMMUNITY): Payer: Self-pay | Admitting: *Deleted

## 2017-04-08 ENCOUNTER — Ambulatory Visit (HOSPITAL_COMMUNITY)
Admission: EM | Admit: 2017-04-08 | Discharge: 2017-04-08 | Disposition: A | Discharge status: Home / Self Care | Payer: Medicaid Other | Attending: Internal Medicine | Admitting: Internal Medicine

## 2017-04-08 ENCOUNTER — Inpatient Hospital Stay (HOSPITAL_COMMUNITY)
Admission: AD | Admit: 2017-04-08 | Discharge: 2017-04-08 | Disposition: A | Discharge status: Home / Self Care | Payer: Self-pay | Source: Ambulatory Visit | Attending: Obstetrics & Gynecology | Admitting: Obstetrics & Gynecology

## 2017-04-08 ENCOUNTER — Encounter (HOSPITAL_COMMUNITY): Payer: Self-pay | Admitting: Emergency Medicine

## 2017-04-08 ENCOUNTER — Encounter: Payer: Self-pay | Admitting: Student

## 2017-04-08 DIAGNOSIS — O00102 Left tubal pregnancy without intrauterine pregnancy: Secondary | ICD-10-CM

## 2017-04-08 DIAGNOSIS — R938 Abnormal findings on diagnostic imaging of other specified body structures: Secondary | ICD-10-CM | POA: Insufficient documentation

## 2017-04-08 DIAGNOSIS — N939 Abnormal uterine and vaginal bleeding, unspecified: Secondary | ICD-10-CM | POA: Insufficient documentation

## 2017-04-08 DIAGNOSIS — Z3201 Encounter for pregnancy test, result positive: Secondary | ICD-10-CM

## 2017-04-08 DIAGNOSIS — N760 Acute vaginitis: Secondary | ICD-10-CM

## 2017-04-08 DIAGNOSIS — N9419 Other specified dyspareunia: Secondary | ICD-10-CM

## 2017-04-08 DIAGNOSIS — O009 Unspecified ectopic pregnancy without intrauterine pregnancy: Secondary | ICD-10-CM | POA: Insufficient documentation

## 2017-04-08 DIAGNOSIS — N9489 Other specified conditions associated with female genital organs and menstrual cycle: Secondary | ICD-10-CM | POA: Insufficient documentation

## 2017-04-08 DIAGNOSIS — B9689 Other specified bacterial agents as the cause of diseases classified elsewhere: Secondary | ICD-10-CM

## 2017-04-08 DIAGNOSIS — N938 Other specified abnormal uterine and vaginal bleeding: Secondary | ICD-10-CM

## 2017-04-08 DIAGNOSIS — Z3490 Encounter for supervision of normal pregnancy, unspecified, unspecified trimester: Secondary | ICD-10-CM

## 2017-04-08 DIAGNOSIS — N941 Unspecified dyspareunia: Secondary | ICD-10-CM

## 2017-04-08 LAB — COMPREHENSIVE METABOLIC PANEL
ALK PHOS: 55 U/L (ref 38–126)
ALT: 16 U/L (ref 14–54)
AST: 19 U/L (ref 15–41)
Albumin: 3.9 g/dL (ref 3.5–5.0)
Anion gap: 5 (ref 5–15)
BUN: 6 mg/dL (ref 6–20)
CALCIUM: 9.4 mg/dL (ref 8.9–10.3)
CO2: 25 mmol/L (ref 22–32)
CREATININE: 0.78 mg/dL (ref 0.44–1.00)
Chloride: 107 mmol/L (ref 101–111)
GFR calc Af Amer: >60 mL/min (ref >60)
Glucose, Bld: 92 mg/dL (ref 65–99)
POTASSIUM: 4.3 mmol/L (ref 3.5–5.1)
Sodium: 137 mmol/L (ref 135–145)
TOTAL PROTEIN: 7.6 g/dL (ref 6.5–8.1)
Total Bilirubin: 0.6 mg/dL (ref 0.3–1.2)

## 2017-04-08 LAB — CBC
HCT: 39.9 % (ref 36.0–46.0)
Hemoglobin: 13.2 g/dL (ref 12.0–15.0)
MCH: 27.9 pg (ref 26.0–34.0)
MCHC: 33.1 g/dL (ref 30.0–36.0)
MCV: 84.4 fL (ref 78.0–100.0)
Platelets: 311 10*3/uL (ref 150–400)
RBC: 4.73 MIL/uL (ref 3.87–5.11)
RDW: 13.8 % (ref 11.5–15.5)
WBC: 5.7 10*3/uL (ref 4.0–10.5)

## 2017-04-08 LAB — POCT URINALYSIS DIP (DEVICE)
BILIRUBIN URINE: NEGATIVE
GLUCOSE, UA: NEGATIVE mg/dL
Ketones, ur: NEGATIVE mg/dL
Leukocytes, UA: NEGATIVE
Nitrite: NEGATIVE
PROTEIN: NEGATIVE mg/dL
Specific Gravity, Urine: >=1.03 (ref 1.005–1.030)
Urobilinogen, UA: 0.2 mg/dL (ref 0.0–1.0)
pH: 6 (ref 5.0–8.0)

## 2017-04-08 LAB — WET PREP, GENITAL
Sperm: NONE SEEN
Trich, Wet Prep: NONE SEEN
WBC, Wet Prep HPF POC: NONE SEEN
YEAST WET PREP: NONE SEEN

## 2017-04-08 LAB — POCT PREGNANCY, URINE: PREG TEST UR: POSITIVE — AB

## 2017-04-08 LAB — ABO/RH: ABO/RH(D): A POS

## 2017-04-08 LAB — HCG, QUANTITATIVE, PREGNANCY: HCG, BETA CHAIN, QUANT, S: 711 m[IU]/mL — AB (ref <5)

## 2017-04-08 MED ORDER — METHOTREXATE INJECTION FOR WOMEN'S HOSPITAL
50.0000 mg/m2 | Freq: Once | INTRAMUSCULAR | Status: AC
  Administered 2017-04-08: 105 mg via INTRAMUSCULAR
  Filled 2017-04-08: qty 2.1

## 2017-04-08 MED ORDER — ONDANSETRON HCL 4 MG PO TABS: 4.0000 mg | ORAL_TABLET | Freq: Every day | ORAL | 1 refills | Status: DC | PRN

## 2017-04-08 MED ORDER — METRONIDAZOLE 500 MG PO TABS: 500.0000 mg | ORAL_TABLET | Freq: Two times a day (BID) | ORAL | 0 refills | Status: DC

## 2017-04-08 NOTE — MAU Note (Signed)
Pt reports bleeding for 18 days (Since June 10th) Pt reports bleeding to be light and has to wear a panty liner through out the day Pt had last period around 02/18/2017. Bleeding is bright red today, but was brown yesterday. Two days ago she had really sharp pain and cramps, but it went away after a couple of hours.  Pt not in pain now, but had pain during intercourse.

## 2017-04-08 NOTE — MAU Provider Note (Signed)
Patient Taylor Carson is a 26 y.o. G1P0 at [redacted]w[redacted]d by LMP here with complaints of occasional abdominal pain in the past week, mostly recently during intercourse two days ago. She complains of bleeding that has been on-going since June 10th. She thought it was her period but it never stopped.   She came here after going to [facility]. When her pregnancy test was positive, [facility] referred her to [facility] for follow-up.  History     CSN: [id number]  Arrival date and time: 04/08/17 1426   First Provider Initiated Contact with Patient 04/08/17 1549      Chief Complaint  Patient presents with  . Vaginal Bleeding  . painful intercourse   Vaginal Bleeding  The patient's primary symptoms include vaginal bleeding. The patient's pertinent negatives include no genital itching, genital lesions, genital odor or missed menses. This is a recurrent problem. The current episode started 1 to 4 weeks ago. The problem occurs intermittently. The pain is mild. She is pregnant. Associated symptoms include abdominal pain. The vaginal discharge was bloody. The vaginal bleeding is lighter than menses. She has not been passing clots. She has not been passing tissue. The symptoms are aggravated by intercourse. She has tried nothing for the symptoms.  Abdominal Pain  This is a new problem. The current episode started in the past 7 days. The problem occurs rarely. The pain is located in the LLQ. The pain is at a severity of 10/10 (she feels an occasional sharp pain a few days ago that was a 10/10 but she has not felt pain in the past few days except for with intercourse. ). The quality of the pain is sharp. The abdominal pain does not radiate.    OB History    Gravida Para Term Preterm AB Living   1             SAB TAB Ectopic Multiple Live Births                  No past medical history on file.  Past Surgical History:  Procedure Laterality Date  . NO PAST SURGERIES      No family history on  file.  Social History  Substance Use Topics  . Smoking status: Former Smoker    Quit date: 08/25/2016  . Smokeless tobacco: Not on file  . Alcohol use Yes    Allergies: No Known Allergies  Prescriptions Prior to Admission  Medication Sig Dispense Refill Last Dose  . hydrocortisone 1 % ointment Apply 1 application topically 2 (two) times daily. (Patient not taking: Reported on 04/08/2017) 30 g 0 Not Taking at Unknown time  . nicotine polacrilex (NICORETTE) 2 MG gum Take 1 each (2 mg total) by mouth as needed for smoking cessation. (Patient not taking: Reported on 04/08/2017) 100 tablet 0 Not Taking at Unknown time    Review of Systems  HENT: Negative.   Respiratory: Negative.   Cardiovascular: Negative.   Gastrointestinal: Positive for abdominal pain.  Genitourinary: Positive for vaginal bleeding. Negative for missed menses.  Musculoskeletal: Negative.   Neurological: Negative.   Psychiatric/Behavioral: Negative.    Physical Exam   Blood pressure 122/78, pulse 65, temperature 98.1 F (36.7 C), temperature source Oral, resp. rate 16, height 5\' 4"  (1.626 m), weight 214 lb (97.1 kg), last menstrual period 03/21/2017.  Physical Exam  Constitutional: She is oriented to person, place, and time. She appears well-developed and well-nourished.  Neck: Normal range of motion.  Respiratory: Effort  normal.  GI: Soft. Bowel sounds are normal.  Genitourinary:  Genitourinary Comments: NEFG; small amount of dark red blood in the vagina. No CMT tenderness, slight left adnexal tenderness, no suprapubic tenderness.   Musculoskeletal: Normal range of motion.  Neurological: She is alert and oriented to person, place, and time. She has normal reflexes.    MAU Course  Procedures  MDM ABO: A pos CBC and CMP: normal, LFTs and creatinine normal. BetaHCG is 711.   [place] Ob Comp Less 14 Wks  Result Date: 04/08/2017 CLINICAL DATA:  Vaginal bleeding for approximately 2 weeks. Pelvic pain for 2 days.  EXAM: OBSTETRIC <14 WK US AND TRANSVAGINAL OB US TECHNIQUE: Both transabdominal and transvaginal ultrasound examinations were performed for complete evaluation of the gestation as well as the maternal uterus, adnexal regions, and pelvic cul-de-sac. Transvaginal technique was performed to assess early pregnancy. COMPARISON:  None. FINDINGS: Intrauterine gestational sac: None Maternal uterus/adnexae: No uterine fibroids identified. Endometrial thickness measures 3 mm. Both ovaries are normal appearance. A heterogeneous mass is seen within the left adnexa abutting the ovary which measures 2.0 by 1.6 x 1.8 cm. No evidence of hemoperitoneum. IMPRESSION: No IUP visualized. 2.0 cm left adnexal mass, highly suspicious for ectopic pregnancy. No evidence of hemoperitoneum. Critical Value/emergent results were called by telephone at the time of interpretation on 04/08/2017 at 5:08 pm to [facility] Merlie Noga in MAU, who verbally acknowledged these results. Electronically Signed   By: Myles RosenthalJohn  Stahl M.D.   On: 04/08/2017 17:12   [place] Ob Transvaginal  Result Date: 04/08/2017 CLINICAL DATA:  Vaginal bleeding for approximately 2 weeks. Pelvic pain for 2 days. EXAM: OBSTETRIC <14 WK US AND TRANSVAGINAL OB US TECHNIQUE: Both transabdominal and transvaginal ultrasound examinations were performed for complete evaluation of the gestation as well as the maternal uterus, adnexal regions, and pelvic cul-de-sac. Transvaginal technique was performed to assess early pregnancy. COMPARISON:  None. FINDINGS: Intrauterine gestational sac: None Maternal uterus/adnexae: No uterine fibroids identified. Endometrial thickness measures 3 mm. Both ovaries are normal appearance. A heterogeneous mass is seen within the left adnexa abutting the ovary which measures 2.0 by 1.6 x 1.8 cm. No evidence of hemoperitoneum. IMPRESSION: No IUP visualized. 2.0 cm left adnexal mass, highly suspicious for ectopic pregnancy. No evidence of hemoperitoneum. Critical  Value/emergent results were called by telephone at the time of interpretation on 04/08/2017 at 5:08 pm to [facility]ven[facility] Sellers in MAU, who verbally acknowledged these results. Electronically Signed   By: Myles RosenthalJohn  Stahl M.D.   On: 04/08/2017 17:12    Reviewed patien'ts labs, physical exam and radiology report with Dr. Erin FullingHarraway-Smith. Patient good candidate for methotrexate administration.   Discussed with patient the diagnosis of ectopic pregnancy. Patient verbalized understanding of the diagnosis and is willing and able to keep follow-up schedule.   Assessment and Plan   1. Left tubal pregnancy without intrauterine pregnancy   2. Vaginal bleeding   3. Bacterial vaginosis     Strict ectopic precautions given Patient to return to MAU on Sunday, July 1st for follow-up beta (Day 4)  Patient to return to MAU on Wednesday, July 4 for follow up Day 7 labs.  RX for flagyl sent  Charlesetta GaribaldiKathryn Lorraine [facility] 04/08/2017, 5:47 PM

## 2017-04-08 NOTE — ED Notes (Signed)
Patient is unable to void at this time 

## 2017-04-08 NOTE — Discharge Instructions (Signed)
[facility]

## 2017-04-08 NOTE — ED Provider Notes (Signed)
CSN: [id number]     Arrival date & time 04/08/17  1244 History   None    No chief complaint on file.  (Consider location/radiation/quality/duration/timing/severity/associated sxs/prior Treatment) Patient c/o painful intercourse and cramps.  She is also having vaginal bleeding.     The history is provided by the patient.  Vaginal Bleeding  Quality:  Dark red Severity:  Moderate Onset quality:  Sudden Duration:  2 days Timing:  Constant Chronicity:  New Menstrual history:  Irregular Possible pregnancy: yes   Relieved by:  Nothing Worsened by:  Nothing   History reviewed. No pertinent past medical history. Past Surgical History:  Procedure Laterality Date  . NO PAST SURGERIES     No family history on file. Social History  Substance Use Topics  . Smoking status: Former Smoker    Quit date: 08/25/2016  . Smokeless tobacco: Not on file  . Alcohol use Yes   OB History    Gravida Para Term Preterm AB Living   1             SAB TAB Ectopic Multiple Live Births                 Review of Systems  Constitutional: Negative.   HENT: Negative.   Eyes: Negative.   Respiratory: Negative.   Cardiovascular: Negative.   Gastrointestinal: Negative.   Endocrine: Negative.   Genitourinary: Positive for vaginal bleeding.  Musculoskeletal: Negative.   Allergic/Immunologic: Negative.   Neurological: Negative.   Hematological: Negative.   Psychiatric/Behavioral: Negative.     Allergies  Patient has no known allergies.  Home Medications   Prior to Admission medications   Medication Sig Start Date End Date Taking? Authorizing Provider  hydrocortisone 1 % ointment Apply 1 application topically 2 (two) times daily. Patient not taking: Reported on 04/08/2017 03/12/16   Uvaldo Rising, MD  nicotine polacrilex (NICORETTE) 2 MG gum Take 1 each (2 mg total) by mouth as needed for smoking cessation. Patient not taking: Reported on 04/08/2017 03/12/16   Uvaldo Rising, MD   Meds Ordered  and Administered this Visit  Medications - No data to display  BP (!) 93/59 (BP Location: Right Arm) Comment: rn notified  Pulse 73   Temp 98.5 F (36.9 C) (Oral)   LMP 03/21/2017   SpO2 99%  No data found.   Physical Exam  Constitutional: She appears well-developed and well-nourished.  HENT:  Head: Normocephalic.  Right Ear: External ear normal.  Left Ear: External ear normal.  Eyes: Conjunctivae are normal. Pupils are equal, round, and reactive to light.  Neck: Normal range of motion. Neck supple.  Cardiovascular: Normal rate, regular rhythm and normal heart sounds.   Pulmonary/Chest: Effort normal and breath sounds normal.  Nursing note and vitals reviewed.   Urgent Care Course     Procedures (including critical care time)  Labs Review Labs Reviewed  POCT URINALYSIS DIP (DEVICE) - Abnormal; Notable for the following:       Result Value   Hgb urine dipstick MODERATE (*)    All other components within normal limits  POCT PREGNANCY, URINE - Abnormal; Notable for the following:    Preg Test, Ur POSITIVE (*)    All other components within normal limits    Imaging Review US Ob Comp Less 14 Wks  Result Date: 04/08/2017 CLINICAL DATA:  Vaginal bleeding for approximately 2 weeks. Pelvic pain for 2 days. EXAM: OBSTETRIC <14 WK [place] AND TRANSVAGINAL OB US TECHNIQUE: Both transabdominal and transvaginal  ultrasound examinations were performed for complete evaluation of the gestation as well as the maternal uterus, adnexal regions, and pelvic cul-de-sac. Transvaginal technique was performed to assess early pregnancy. COMPARISON:  None. FINDINGS: Intrauterine gestational sac: None Maternal uterus/adnexae: No uterine fibroids identified. Endometrial thickness measures 3 mm. Both ovaries are normal appearance. A heterogeneous mass is seen within the left adnexa abutting the ovary which measures 2.0 by 1.6 x 1.8 cm. No evidence of hemoperitoneum. IMPRESSION: No IUP visualized. 2.0 cm left  adnexal mass, highly suspicious for ectopic pregnancy. No evidence of hemoperitoneum. Critical Value/emergent results were called by telephone at the time of interpretation on 04/08/2017 at 5:08 pm [facility], who verbally acknowledged these results. Electronically Signed   By: Myles RosenthalJohn  Stahl M.D.   On: 04/08/2017 17:12   [place] Ob Transvaginal  Result Date: 04/08/2017 CLINICAL DATA:  Vaginal bleeding for approximately 2 weeks. Pelvic pain for 2 days. EXAM: OBSTETRIC <14 WK US AND TRANSVAGINAL OB US TECHNIQUE: Both transabdominal and transvaginal ultrasound examinations were performed for complete evaluation of the gestation as well as the maternal uterus, adnexal regions, and pelvic cul-de-sac. Transvaginal technique was performed to assess early pregnancy. COMPARISON:  None. FINDINGS: Intrauterine gestational sac: None Maternal uterus/adnexae: No uterine fibroids identified. Endometrial thickness measures 3 mm. Both ovaries are normal appearance. A heterogeneous mass is seen within the left adnexa abutting the ovary which measures 2.0 by 1.6 x 1.8 cm. No evidence of hemoperitoneum. IMPRESSION: No IUP visualized. 2.0 cm left adnexal mass, highly suspicious for ectopic pregnancy. No evidence of hemoperitoneum. Critical Value/emergent results were called by telephone at the time of interpretation on 04/08/2017 at 5:08 pm [facility], who verbally acknowledged these results. Electronically Signed   By: Myles RosenthalJohn  Stahl M.D.   On: 04/08/2017 17:12     Visual Acuity Review  Right Eye Distance:   Left Eye Distance:   Bilateral Distance:    Right Eye Near:   Left Eye Near:    Bilateral Near:         MDM   1. Uterine bleeding   2. Pregnancy, unspecified gestational age   653. Dyspareunia in female    [facility] and patient to go now      Deatra [place], Lakaya Tolen J, [place] 04/08/17 1729

## 2017-04-08 NOTE — Discharge Instructions (Signed)
Ectopic Pregnancy An ectopic pregnancy is when the fertilized egg attaches (implants) outside the uterus. Most ectopic pregnancies occur in one of the tubes where eggs travel from the ovary to the uterus (fallopian tubes), but the implanting can occur in other locations. In rare cases, ectopic pregnancies occur on the ovary, intestine, pelvis, abdomen, or cervix. In an ectopic pregnancy, the fertilized egg does not have the ability to develop into a normal, healthy baby. A ruptured ectopic pregnancy is one in which tearing or bursting of a fallopian tube causes internal bleeding. Often, there is intense lower abdominal pain, and vaginal bleeding sometimes occurs. Having an ectopic pregnancy can be life-threatening. If this dangerous condition is not treated, it can lead to blood loss, shock, or even death. What are the causes? The most common cause of this condition is damage to one of the fallopian tubes. A fallopian tube may be narrowed or blocked, and that keeps the fertilized egg from reaching the uterus. What increases the risk? This condition is more likely to develop in women of childbearing age who have different levels of risk. The levels of risk can be divided into three categories. High risk  You have gone through infertility treatment.  You have had an ectopic pregnancy before.  You have had surgery on the fallopian tubes, or another surgical procedure, such as an abortion.  You have had surgery to have the fallopian tubes tied (tubal ligation).  You have problems or diseases of the fallopian tubes.  You have been exposed to diethylstilbestrol (DES). This medicine was used until 1971, and it had effects on babies whose mothers took the medicine.  You become pregnant while using an IUD (intrauterine device) for birth control. Moderate risk  You have a history of infertility.  You have had an STI (sexually transmitted infection).  You have a history of pelvic inflammatory  disease (PID).  You have scarring from endometriosis.  You have multiple sexual partners.  You smoke. Low risk  You have had pelvic surgery.  You use vaginal douches.  You became sexually active before age 18. What are the signs or symptoms? Common symptoms of this condition include normal pregnancy symptoms, such as missing a period, nausea, tiredness, abdominal pain, breast tenderness, and bleeding. However, ectopic pregnancy will have additional symptoms, such as:  Pain with intercourse.  Irregular vaginal bleeding or spotting.  Cramping or pain on one side or in the lower abdomen.  Fast heartbeat, low blood pressure, and sweating.  Passing out while having a bowel movement.  Symptoms of a ruptured ectopic pregnancy and internal bleeding may include:  Sudden, severe pain in the abdomen and pelvis.  Dizziness, weakness, light-headedness, or fainting.  Pain in the shoulder or neck area.  How is this diagnosed? This condition is diagnosed by:  A pelvic exam to locate pain or a mass in the abdomen.  A pregnancy test. This blood test checks for the presence as well as the specific level of pregnancy hormone in the bloodstream.  Ultrasound. This is performed if a pregnancy test is positive. In this test, a probe is inserted into the vagina. The probe will detect a fetus, possibly in a location other than the uterus.  Taking a sample of uterus tissue (dilation and curettage, or D&C).  Surgery to perform a visual exam of the inside of the abdomen using a thin, lighted tube that has a tiny camera on the end (laparoscope).  Culdocentesis. This procedure involves inserting a needle at the top   of the vagina, behind the uterus. If blood is present in this area, it may indicate that a fallopian tube is torn.  How is this treated? This condition is treated with medicine or surgery. Medicine  An injection of a medicine (methotrexate) may be given to cause the pregnancy tissue  to be absorbed. This medicine may save your fallopian tube. It may be given if: ? The diagnosis is made early, with no signs of active bleeding. ? The fallopian tube has not ruptured. ? You are considered to be a good candidate for the medicine. Usually, pregnancy hormone blood levels are checked after methotrexate treatment. This is to be sure that the medicine is effective. It may take 4-6 weeks for the pregnancy to be absorbed. Most pregnancies will be absorbed by 3 weeks. Surgery  A laparoscope may be used to remove the pregnancy tissue.  If severe internal bleeding occurs, a larger cut (incision) may be made in the lower abdomen (laparotomy) to remove the fetus and placenta. This is done to stop the bleeding.  Part or all of the fallopian tube may be removed (salpingectomy) along with the fetus and placenta. The fallopian tube may also be repaired during the surgery.  In very rare circumstances, removal of the uterus (hysterectomy) may be required.  After surgery, pregnancy hormone testing may be done to be sure that there is no pregnancy tissue left. Whether your treatment is medicine or surgery, you may receive a Rho (D) immune globulin shot to prevent problems with any future pregnancy. This shot may be given if:  You are Rh-negative and the baby's father is Rh-positive.  You are Rh-negative and you do not know the Rh type of the baby's father.  Follow these instructions at home:  Rest and limit your activity after the procedure for as long as told by your health care provider.  Until your health care provider says that it is safe: ? Do not lift anything that is heavier than 10 lb (4.5 kg), or the limit that your health care provider tells you. ? Avoid physical exercise and any movement that requires effort (is strenuous).  To help prevent constipation: ? Eat a healthy diet that includes fruits, vegetables, and whole grains. ? Drink 6-8 glasses of water per day. Get help  right away if:  You develop worsening pain that is not relieved by medicine.  You have: ? A fever or chills. ? Vaginal bleeding. ? Redness and swelling at the incision site. ? Nausea and vomiting.  You feel dizzy or weak.  You feel light-headed or you faint. This information is not intended to replace advice given to you by your health care provider. Make sure you discuss any questions you have with your health care provider. Document Released: 11/05/2004 Document Revised: 05/27/2016 Document Reviewed: 04/29/2016 Elsevier Interactive Patient Education  2018 Elsevier Inc.  

## 2017-04-08 NOTE — ED Triage Notes (Signed)
Painful intercourse, 2 days ago had cramps.  Denies vaginal discharge or itching.  Patient has a lengthy menstrual cycle currently.

## 2017-04-08 NOTE — MAU Note (Signed)
Pt sent here from Urgent Care, having painful intercourse, bleeding for the last 3 weeks, had pain 2 days ago - in pelvis shooting back to rectum.  Denies pain today.  Pos UPT at Urgent Care.

## 2017-04-08 NOTE — ED Notes (Signed)
Patient is still unable to void 

## 2017-04-09 LAB — GC/CHLAMYDIA PROBE AMP (~~LOC~~) NOT AT ARMC
Chlamydia: NEGATIVE
Neisseria Gonorrhea: NEGATIVE

## 2017-04-11 ENCOUNTER — Inpatient Hospital Stay (HOSPITAL_COMMUNITY)
Admission: AD | Admit: 2017-04-11 | Discharge: 2017-04-11 | Disposition: A | Discharge status: Home / Self Care | Payer: Medicaid Other | Source: Ambulatory Visit | Attending: Obstetrics & Gynecology | Admitting: Obstetrics & Gynecology

## 2017-04-11 DIAGNOSIS — O009 Unspecified ectopic pregnancy without intrauterine pregnancy: Secondary | ICD-10-CM | POA: Insufficient documentation

## 2017-04-11 DIAGNOSIS — O00102 Left tubal pregnancy without intrauterine pregnancy: Secondary | ICD-10-CM

## 2017-04-11 LAB — HCG, QUANTITATIVE, PREGNANCY: HCG, BETA CHAIN, QUANT, S: 896 m[IU]/mL — AB (ref <5)

## 2017-04-11 NOTE — MAU Note (Addendum)
Pain is back as before rates 5/10, bilateral lower abdominal pain, no bleeding yesterday, bleeding today light in nature. Patient is s/p Methotrexate on 04/08/17

## 2017-04-11 NOTE — MAU Provider Note (Signed)
History    First Provider Initiated Contact with Patient 04/11/17 1032      Chief Complaint:  Follow-up and Abdominal Pain   Taylor Carson is  26 y.o. G1P0 Patient's last menstrual period was 03/21/2017.Marland Kitchen. Patient is here for follow up of quantitative HCG After receiving methotrexate injection on 04/08/2017 for left ectopic pregnancy. She is 1566w0d weeks gestation  by LMP.    Since her last visit, the patient is without new complaint.     ROS Abdomin Pain: Mild-moderate, intermittent. Much better than last visit. Vaginal bleeding: spotting.   Passage of clots or tissue: Denies Dizziness: Denies  A POS  Her previous Quantitative HCG values are:  04/08/2017: 711  Physical Exam   Patient Vitals for the past 24 hrs:  BP Temp Pulse Resp  04/11/17 0920 105/71 98.2 F (36.8 C) 68 16   Constitutional: Well-nourished female in no apparent distress. No pallor Neuro: Alert and oriented 4 Cardiovascular: Normal rate Respiratory: Normal effort and rate Gynecological Exam: examination not indicated  Labs: Results for orders placed or performed during the hospital encounter of 04/11/17 (from the past 24 hour(s))  hCG, quantitative, pregnancy   Collection Time: 04/11/17  9:19 AM  Result Value Ref Range   hCG, Beta Chain, Quant, S 896 (H) <5 mIU/mL    Ultrasound Studies:   NA  MAU course/MDM: Quantitative hCG ordered  Ectopic pregnancy status post methotrexate treatment with continued rise of hCG on day #4, but hemodynamically stable. No evidence of ruptured ectopic.  Assessment: Ectopic pregnancy   Plan: Discharge home in stable condition. Ectopic pregnancy Precautions Continue abstinence and avoiding folic acid and NSAIDs .  Explained importance of following hCGs until less than 5 and continued abstinence until cleared by provider.  Follow-up Information    [facility] Follow up on 04/14/2017.   Why:  repeat blood work or  sooner as needed in emergencies Contact information: [address] [id number] [place] [phone]         Allergies as of 04/11/2017   No Known Allergies     Medication List    TAKE these medications   hydrocortisone 1 % ointment Apply 1 application topically 2 (two) times daily.   metroNIDAZOLE 500 MG tablet Commonly known as:  FLAGYL Take 1 tablet (500 mg total) by mouth 2 (two) times daily.   nicotine polacrilex 2 MG gum Commonly known as:  NICORETTE Take 1 each (2 mg total) by mouth as needed for smoking cessation.   ondansetron 4 MG tablet Commonly known as:  ZOFRAN Take 1 tablet (4 mg total) by mouth daily as needed for nausea or vomiting.       Katrinka BlazingSmith, [place], CNM 04/11/2017, 10:32 AM  2/3

## 2017-04-11 NOTE — Discharge Instructions (Signed)
Results for Taylor Carson, Taylor Carson (MRN [id number]) as of 04/11/2017 10:29  Ref. Range 04/08/2017 15:49 04/11/2017 09:19  HCG, Beta Chain, Quant, S Latest Ref Range: <5 mIU/mL 711 (H) 896 (H)    Methotrexate Treatment for an Ectopic Pregnancy Methotrexate is a medicine that treats a pregnancy condition in which the fetus develops outside the uterus (ectopic pregnancy) by stopping the growth of the fertilized egg. It also helps your body absorb tissue from the egg. This takes between 2 weeks and 6 weeks. Most ectopic pregnancies can be successfully treated with methotrexate if they are detected early enough. Tell a health care provider about:  Any allergies you have.  All medicines you are taking, including vitamins, herbs, eye drops, creams, and over-the-counter medicines.  Any medical conditions you have. What are the risks? Generally, this is a safe treatment. However, problems can occur, including:  Nausea.  Vomiting.  Diarrhea.  Abdominal cramping.  Mouth sores.  Increased vaginal bleeding or spotting.  Swelling or irritation of the lining of your lungs (pneumonitis).  Failed treatment and continuation of the pregnancy.  Liver damage.  Hair loss.  There is still a risk of the ectopic pregnancy rupturing while using the methotrexate. What happens before the procedure? Before you take the medicine:  Liver tests, kidney tests, and a complete blood test are performed.  Blood tests are performed to measure the pregnancy hormone levels and to determine your blood type.  If you are Rh-negative and the father is Rh-positive or his Rh type is not known, you will be given a Rho (D) immune globulin shot.  What happens during the procedure? There are two methods that your health care provider may use to give you methotrexate.  One method involves a single dose or injection of the medicine.  Another method involves a series of doses given through several injections.  What happens  after the procedure?  You may have some abdominal cramping, vaginal bleeding, and fatigue in the first few days after taking methotrexate.  Blood tests will be taken for several weeks to check the pregnancy hormone levels. The blood tests are performed until there is no more pregnancy hormone detected in the blood. This information is not intended to replace advice given to you by your health care provider. Make sure you discuss any questions you have with your health care provider. Document Released: 09/22/2001 Document Revised: 03/05/2016 Document Reviewed: 07/17/2013 Elsevier Interactive Patient Education  2017 [employer]

## 2017-04-14 ENCOUNTER — Inpatient Hospital Stay (HOSPITAL_COMMUNITY)
Admission: AD | Admit: 2017-04-14 | Discharge: 2017-04-14 | Disposition: A | Discharge status: Home / Self Care | Payer: Medicaid Other | Source: Ambulatory Visit | Attending: Family Medicine | Admitting: Family Medicine

## 2017-04-14 DIAGNOSIS — O00102 Left tubal pregnancy without intrauterine pregnancy: Secondary | ICD-10-CM

## 2017-04-14 DIAGNOSIS — Z3A01 Less than 8 weeks gestation of pregnancy: Secondary | ICD-10-CM | POA: Insufficient documentation

## 2017-04-14 DIAGNOSIS — O009 Unspecified ectopic pregnancy without intrauterine pregnancy: Secondary | ICD-10-CM | POA: Insufficient documentation

## 2017-04-14 DIAGNOSIS — Z87891 Personal history of nicotine dependence: Secondary | ICD-10-CM | POA: Insufficient documentation

## 2017-04-14 LAB — HCG, QUANTITATIVE, PREGNANCY: hCG, Beta Chain, Quant, S: 715 m[IU]/mL — ABNORMAL HIGH (ref <5)

## 2017-04-14 NOTE — MAU Provider Note (Signed)
Chief Complaint: Follow-up  Provider saw patient at 1240hrs     SUBJECTIVE HPI: Taylor Carson is a 26 y.o. G1P0 at [redacted]w[redacted]d by LMP who presents to maternity admissions reporting history of Methotrexate for ectopic pregnancy.  This is her Day 7 followup.  Is having some light spotting and mild cramping.  She denies vaginal itching/burning, urinary symptoms, h/a, dizziness, n/v, or fever/chills.    Abdominal Pain  This is a recurrent problem. The current episode started in the past 7 days. The onset quality is gradual. The problem occurs intermittently. The problem has been gradually improving. The pain is located in the suprapubic region and LLQ. The pain is mild. The quality of the pain is cramping. The abdominal pain does not radiate. Associated symptoms include diarrhea and nausea. Pertinent negatives include no constipation, fever, myalgias or vomiting. Nothing aggravates the pain. The pain is relieved by nothing. She has tried nothing for the symptoms.    RN Note: Doing ok. Still having light bleeding and cramping off and on.  Some GI issues, nausea and loose stool- was told that could happen.  Time associated with blood draw discussed.  Would like to get something to eat- will be back in an hour    Electronically signed by Spurlock-Frizzell, Jolynn      No past medical history on file. Past Surgical History:  Procedure Laterality Date  . NO PAST SURGERIES     Social History   Social History  . Marital status: Single    Spouse name: N/A  . Number of children: N/A  . Years of education: N/A   Occupational History  . Not on file.   Social History Main Topics  . Smoking status: Former Smoker    Quit date: 08/25/2016  . Smokeless tobacco: Not on file  . Alcohol use Yes  . Drug use: Yes    Types: Marijuana  . Sexual activity: Not on file   Other Topics Concern  . Not on file   Social History Narrative  . No narrative on file   No current facility-administered  medications on file prior to encounter.    Current Outpatient Prescriptions on File Prior to Encounter  Medication Sig Dispense Refill  . hydrocortisone 1 % ointment Apply 1 application topically 2 (two) times daily. (Patient not taking: Reported on 04/08/2017) 30 g 0  . metroNIDAZOLE (FLAGYL) 500 MG tablet Take 1 tablet (500 mg total) by mouth 2 (two) times daily. 14 tablet 0  . nicotine polacrilex (NICORETTE) 2 MG gum Take 1 each (2 mg total) by mouth as needed for smoking cessation. (Patient not taking: Reported on 04/08/2017) 100 tablet 0  . ondansetron (ZOFRAN) 4 MG tablet Take 1 tablet (4 mg total) by mouth daily as needed for nausea or vomiting. 30 tablet 1   No Known Allergies  I have reviewed patient's Past Medical Hx, Surgical Hx, Family Hx, Social Hx, medications and allergies.   ROS:  Review of Systems  Constitutional: Negative for fever.  Gastrointestinal: Positive for abdominal pain, diarrhea and nausea. Negative for constipation and vomiting.  Musculoskeletal: Negative for myalgias.   Review of Systems  Other systems negative   Physical Exam  Physical Exam Patient Vitals for the past 24 hrs:  BP Temp Pulse Resp SpO2  04/14/17 1250 112/68 99 F (37.2 C) 68 16 99 %   Constitutional: Well-developed, well-nourished female in no acute distress.  Cardiovascular: normal rate Respiratory: normal effort GI: Abd soft, non-tender. Pos BS x 4 MS:  Extremities nontender, no edema, normal ROM Neurologic: Alert and oriented x 4.  GU: Neg CVAT.  PELVIC EXAM: deferred  LAB RESULTS Results for orders placed or performed during the hospital encounter of 04/14/17 (from the past 24 hour(s))  hCG, quantitative, pregnancy     Status: Abnormal   Collection Time: 04/14/17 12:37 PM  Result Value Ref Range   hCG, Beta Chain, Quant, S 715 (H) <5 mIU/mL    Ref. Range 04/08/2017 15:49 04/08/2017 17:05 04/11/2017 09:19 04/14/2017 12:37  HCG, Beta Chain, Quant, S Latest Ref Range: <5 mIU/mL 711  (H)  896 (H) 715 (H)    --/--/A POS (06/28 1549)  IMAGING [place] Ob Comp Less 14 Wks  Result Date: 04/08/2017 CLINICAL DATA:  Vaginal bleeding for approximately 2 weeks. Pelvic pain for 2 days. EXAM: OBSTETRIC <14 WK US AND TRANSVAGINAL OB US TECHNIQUE: Both transabdominal and transvaginal ultrasound examinations were performed for complete evaluation of the gestation as well as the maternal uterus, adnexal regions, and pelvic cul-de-sac. Transvaginal technique was performed to assess early pregnancy. COMPARISON:  None. FINDINGS: Intrauterine gestational sac: None Maternal uterus/adnexae: No uterine fibroids identified. Endometrial thickness measures 3 mm. Both ovaries are normal appearance. A heterogeneous mass is seen within the left adnexa abutting the ovary which measures 2.0 by 1.6 x 1.8 cm. No evidence of hemoperitoneum. IMPRESSION: No IUP visualized. 2.0 cm left adnexal mass, highly suspicious for ectopic pregnancy. No evidence of hemoperitoneum. Critical Value/emergent results were called by telephone at the time of interpretation on 04/08/2017 at 5:08 pm to [facility], who verbally acknowledged these results. Electronically Signed   By: Myles RosenthalJohn  Stahl M.D.   On: 04/08/2017 17:12   [place] Ob Transvaginal  Result Date: 04/08/2017 CLINICAL DATA:  Vaginal bleeding for approximately 2 weeks. Pelvic pain for 2 days. EXAM: OBSTETRIC <14 WK US AND TRANSVAGINAL OB US TECHNIQUE: Both transabdominal and transvaginal ultrasound examinations were performed for complete evaluation of the gestation as well as the maternal uterus, adnexal regions, and pelvic cul-de-sac. Transvaginal technique was performed to assess early pregnancy. COMPARISON:  None. FINDINGS: Intrauterine gestational sac: None Maternal uterus/adnexae: No uterine fibroids identified. Endometrial thickness measures 3 mm. Both ovaries are normal appearance. A heterogeneous mass is seen within the left adnexa abutting the ovary which measures  2.0 by 1.6 x 1.8 cm. No evidence of hemoperitoneum. IMPRESSION: No IUP visualized. 2.0 cm left adnexal mass, highly suspicious for ectopic pregnancy. No evidence of hemoperitoneum. Critical Value/emergent results were called by telephone at the time of interpretation on 04/08/2017 at 5:08 pm [facility], who verbally acknowledged these results. Electronically Signed   By: Myles RosenthalJohn  Stahl M.D.   On: 04/08/2017 17:12    MAU Management/MDM: HCG level has gone down just over 15% which is expected Discussed strict ectopic precautions to return for Will start weekly followup non-stat HCG levels in clinic next week  ASSESSMENT Left ectopic pregnancy Appropriate drop in HCG levels post Methotrexate therapy  PLAN Discharge home Plan to repeat HCG level in 1 week in clinic Ectopic precautions   Pt stable at time of discharge. Encouraged to return here or to other Urgent Care/ED if she develops worsening of symptoms, increase in pain, fever, or other concerning symptoms.    Wynelle BourgeoisMarie Williams CNM, MSN Certified Nurse-Midwife 04/14/2017  1:19 PM

## 2017-04-14 NOTE — MAU Note (Signed)
Doing ok. Still having light bleeding and cramping off and on.  Some GI issues, nausea and loose stool- was told that could happen.  Time associated with blood draw discussed.  Would like to get something to eat- will be back in an hour

## 2017-04-14 NOTE — Discharge Instructions (Signed)
Methotrexate Treatment for an Ectopic Pregnancy, Care After °Refer to this sheet in the next few weeks. These instructions provide you with information on caring for yourself after your procedure. Your health care provider may also give you more specific instructions. Your treatment has been planned according to current medical practices, but problems sometimes occur. Call your health care provider if you have any problems or questions after your procedure. °What can I expect after the procedure? °You may have some abdominal cramping, vaginal bleeding, and fatigue in the first few days after taking methotrexate. Some other possible side effects of methotrexate include: °· Nausea. °· Vomiting. °· Diarrhea. °· Mouth sores. °· Swelling or irritation of the lining of your lungs (pneumonitis). °· Liver damage. °· Hair loss. ° °Follow these instructions at home: °After you have received the methotrexate medicine, you need to be careful of your activities and watch your condition for several weeks. It may take 1 week before your hormone levels return to normal. °Activity °· Do not have sexual intercourse until your health care provider says it is safe to do so. °· You may resume your usual diet. °· Limit strenuous activity. °· Do not drink alcohol. °General instructions °· Do not take aspirin, ibuprofen, or naproxen (nonsteroidal anti-inflammatory drugs [NSAIDs]). °· Do not take folic acid, prenatal vitamins, or other vitamins that contain folic acid. °· Avoid traveling too far away from your health care provider. °· Keep all follow-up visits as told by your health care provider. This is important. °Contact a health care provider if: °· You cannot control your nausea and vomiting. °· You cannot control your diarrhea. °· You have sores in your mouth and want treatment. °· You need pain medicine for your abdominal pain. °· You have a rash. °· You are having a reaction to the medicine. °Get help right away if: °· You have  increasing abdominal or pelvic pain. °· You notice increased bleeding. °· You feel light-headed, or you faint. °· You have shortness of breath. °· Your heart rate increases. °· You have a cough. °· You have chills. °· You have a fever. °This information is not intended to replace advice given to you by your health care provider. Make sure you discuss any questions you have with your health care provider. °Document Released: 09/17/2011 Document Revised: 03/05/2016 Document Reviewed: 07/17/2013 °Elsevier Interactive Patient Education © 2017 [employer] ° °

## 2017-04-21 ENCOUNTER — Other Ambulatory Visit: Payer: Medicaid Other

## 2017-04-30 ENCOUNTER — Inpatient Hospital Stay (HOSPITAL_COMMUNITY): Payer: Self-pay

## 2017-04-30 ENCOUNTER — Inpatient Hospital Stay (HOSPITAL_COMMUNITY)
Admission: AD | Admit: 2017-04-30 | Discharge: 2017-04-30 | Disposition: A | Discharge status: Home / Self Care | Payer: Self-pay | Source: Ambulatory Visit | Attending: Obstetrics & Gynecology | Admitting: Obstetrics & Gynecology

## 2017-04-30 ENCOUNTER — Encounter (HOSPITAL_COMMUNITY): Payer: Self-pay | Admitting: *Deleted

## 2017-04-30 DIAGNOSIS — O00102 Left tubal pregnancy without intrauterine pregnancy: Secondary | ICD-10-CM

## 2017-04-30 DIAGNOSIS — F17210 Nicotine dependence, cigarettes, uncomplicated: Secondary | ICD-10-CM | POA: Insufficient documentation

## 2017-04-30 DIAGNOSIS — O009 Unspecified ectopic pregnancy without intrauterine pregnancy: Secondary | ICD-10-CM | POA: Insufficient documentation

## 2017-04-30 LAB — URINALYSIS, ROUTINE W REFLEX MICROSCOPIC
BILIRUBIN URINE: NEGATIVE
GLUCOSE, UA: NEGATIVE mg/dL
KETONES UR: NEGATIVE mg/dL
LEUKOCYTES UA: NEGATIVE
NITRITE: NEGATIVE
PH: 6 (ref 5.0–8.0)
Protein, ur: NEGATIVE mg/dL
Specific Gravity, Urine: 1.015 (ref 1.005–1.030)

## 2017-04-30 LAB — CBC
HCT: 38.6 % (ref 36.0–46.0)
Hemoglobin: 12.7 g/dL (ref 12.0–15.0)
MCH: 28.4 pg (ref 26.0–34.0)
MCHC: 32.9 g/dL (ref 30.0–36.0)
MCV: 86.4 fL (ref 78.0–100.0)
PLATELETS: 330 10*3/uL (ref 150–400)
RBC: 4.47 MIL/uL (ref 3.87–5.11)
RDW: 14.2 % (ref 11.5–15.5)
WBC: 6.1 10*3/uL (ref 4.0–10.5)

## 2017-04-30 LAB — HCG, QUANTITATIVE, PREGNANCY: HCG, BETA CHAIN, QUANT, S: 115 m[IU]/mL — AB (ref <5)

## 2017-04-30 NOTE — Discharge Instructions (Signed)
Ectopic Pregnancy °An ectopic pregnancy happens when a fertilized egg grows outside the uterus. A pregnancy cannot live outside of the uterus. This problem often happens in the fallopian tube. It is often caused by damage to the fallopian tube. °If this problem is found early, you may be treated with medicine. If your tube tears or bursts open (ruptures), you will bleed inside. This is an emergency. You will need surgery. Get help right away. °What are the signs or symptoms? °You may have normal pregnancy symptoms at first. These include: °· Missing your period. °· Feeling sick to your stomach (nauseous). °· Being tired. °· Having tender breasts. ° °Then, you may start to have symptoms that are not normal. These include: °· Pain with sex (intercourse). °· Bleeding from the vagina. This includes light bleeding (spotting). °· Belly (abdomen) or lower belly cramping or pain. This may be felt on one side. °· A fast heartbeat (pulse). °· Passing out (fainting) after going poop (bowel movement). ° °If your tube tears, you may have symptoms such as: °· Really bad pain in the belly or lower belly. This happens suddenly. °· Dizziness. °· Passing out. °· Shoulder pain. ° °Get help right away if: °You have any of these symptoms. This is an emergency. °This information is not intended to replace advice given to you by your health care provider. Make sure you discuss any questions you have with your health care provider. °Document Released: 12/25/2008 Document Revised: 03/05/2016 Document Reviewed: 05/10/2013 °Elsevier Interactive Patient Education © 2017 Elsevier Inc. ° °

## 2017-04-30 NOTE — MAU Provider Note (Signed)
History     CSN: [id number]  Arrival date and time: 04/30/17 1426   First Provider Initiated Contact with Patient 04/30/17 1603      Chief Complaint  Patient presents with  . Vaginal Bleeding  . Abdominal Pain   26 year old female G1P0 presenting for ectopic pregnancy follow up s/p Methotrexate 04/08/17. Last seen on 04/14/17 when hCG was 715. She missed follow up appointment for hCG check because she was out of town for 2.5 weeks for a family reunion. Last night she had increase in bleeding, which she first noticed when wiping. She subsequently soaked through multiple pads overnight. She now has returned to her baseline bleeding. Also had continuous left lower quadrant abdominal pain 7/10, which she describes as "something moving." Pain is now intermittent, not associated with any particular triggers. She does not require anything for pain management. Not currently sexually active.   OB History    Gravida Para Term Preterm AB Living   1             SAB TAB Ectopic Multiple Live Births                  Past Medical History:  Diagnosis Date  . Ectopic pregnancy 03/2017    Past Surgical History:  Procedure Laterality Date  . NO PAST SURGERIES      History reviewed. No pertinent family history.  Social History  Substance Use Topics  . Smoking status: Current Every Day Smoker    Packs/day: 0.25    Types: Cigarettes    Last attempt to quit: 08/25/2016  . Smokeless tobacco: Never Used  . Alcohol use No    Allergies: No Known Allergies  Prescriptions Prior to Admission  Medication Sig Dispense Refill Last Dose  . hydrocortisone 1 % ointment Apply 1 application topically 2 (two) times daily. (Patient not taking: Reported on 04/08/2017) 30 g 0 Not Taking at Unknown time  . metroNIDAZOLE (FLAGYL) 500 MG tablet Take 1 tablet (500 mg total) by mouth 2 (two) times daily. (Patient not taking: Reported on 04/30/2017) 14 tablet 0 Not Taking at Unknown time  . nicotine polacrilex  (NICORETTE) 2 MG gum Take 1 each (2 mg total) by mouth as needed for smoking cessation. (Patient not taking: Reported on 04/08/2017) 100 tablet 0 Not Taking at Unknown time  . ondansetron (ZOFRAN) 4 MG tablet Take 1 tablet (4 mg total) by mouth daily as needed for nausea or vomiting. (Patient not taking: Reported on 04/30/2017) 30 tablet 1 Not Taking at Unknown time   Review of Systems  Constitutional: Negative.   Respiratory: Negative.   Cardiovascular: Negative.   Gastrointestinal: Negative.   Genitourinary: Positive for pelvic pain and vaginal bleeding. Negative for dysuria, flank pain, frequency, hematuria, urgency, vaginal discharge and vaginal pain.  Neurological: Negative for dizziness, seizures, syncope, weakness, light-headedness, numbness and headaches.  Psychiatric/Behavioral: Negative.    Physical Exam   Blood pressure 119/78, pulse 80, temperature 98.2 F (36.8 C), resp. rate 16, last menstrual period 03/21/2017.  Physical Exam  Constitutional: She is oriented to person, place, and time. She appears well-developed and well-nourished. No distress.  HENT:  Head: Normocephalic and atraumatic.  Neck: Normal range of motion.  Cardiovascular: Normal rate.   Respiratory: Effort normal. No respiratory distress.  GI: Soft. Bowel sounds are normal. She exhibits no distension and no mass. There is tenderness in the left lower quadrant. There is no rebound and no guarding.  Left lower quadrant tenderness  Genitourinary:  There is no injury on the right labia. Uterus is tender. Cervix exhibits no motion tenderness and no discharge. Right adnexum displays no mass, no tenderness and no fullness. Left adnexum displays tenderness. Left adnexum displays no mass and no fullness. There is bleeding in the vagina. No tenderness in the vagina. No vaginal discharge found.  Neurological: She is alert and oriented to person, place, and time.  Skin: Skin is warm. She is not diaphoretic.  Psychiatric: She  has a normal mood and affect. Her behavior is normal. Judgment and thought content normal.   Results for orders placed or performed during the hospital encounter of 04/30/17 (from the past 24 hour(s))  Urinalysis, Routine w reflex microscopic     Status: Abnormal   Collection Time: 04/30/17  2:04 PM  Result Value Ref Range   Color, Urine YELLOW YELLOW   APPearance CLEAR CLEAR   Specific Gravity, Urine 1.015 1.005 - 1.030   pH 6.0 5.0 - 8.0   Glucose, UA NEGATIVE NEGATIVE mg/dL   Hgb urine dipstick LARGE (A) NEGATIVE   Bilirubin Urine NEGATIVE NEGATIVE   Ketones, ur NEGATIVE NEGATIVE mg/dL   Protein, ur NEGATIVE NEGATIVE mg/dL   Nitrite NEGATIVE NEGATIVE   Leukocytes, UA NEGATIVE NEGATIVE   RBC / HPF 0-5 0 - 5 RBC/hpf   WBC, UA 0-5 0 - 5 WBC/hpf   Bacteria, UA RARE (A) NONE SEEN   Squamous Epithelial / LPF 0-5 (A) NONE SEEN   Mucous PRESENT   CBC     Status: None   Collection Time: 04/30/17  3:47 PM  Result Value Ref Range   WBC 6.1 4.0 - 10.5 K/uL   RBC 4.47 3.87 - 5.11 MIL/uL   Hemoglobin 12.7 12.0 - 15.0 g/dL   HCT 09.8 11.9 - 14.7 %   MCV 86.4 78.0 - 100.0 fL   MCH 28.4 26.0 - 34.0 pg   MCHC 32.9 30.0 - 36.0 g/dL   RDW 82.9 56.2 - 13.0 %   Platelets 330 150 - 400 K/uL  hCG, quantitative, pregnancy     Status: Abnormal   Collection Time: 04/30/17  3:47 PM  Result Value Ref Range   hCG, Beta Chain, Quant, S 115 (H) <5 mIU/mL   US Ob Transvaginal  Result Date: 04/30/2017 CLINICAL DATA:  Ectopic pregnancy, known LEFT ectopic pregnancy post methotrexate treatment EXAM: TRANSVAGINAL OB ULTRASOUND TECHNIQUE: Transvaginal ultrasound was performed for complete evaluation of the gestation as well as the maternal uterus, adnexal regions, and pelvic cul-de-sac. COMPARISON:  04/08/2017 FINDINGS: Intrauterine gestational sac: Absent Yolk sac:  N/A Embryo:  N/A Cardiac Activity: N/A Heart Rate: N/A bpm Subchorionic hemorrhage:  N/A Maternal uterus/adnexae: Normal uterine morphology.  Endometrial complex 4 mm thick, normal. RIGHT ovary measures 2.5 x 2.3 x 2.7 cm in size and contains a minimally complicated cyst by 1.9 x 2.0 cm 2.3 cm containing partial septations. LEFT ovary measures 3.6 x 4.5 x 3.5 cm in size and contains a 3.1 x 2.9 x 2.9 cm diameter simple cyst. Adjacent to LEFT ovary, a hypoechoic solid-appearing nodule is again identified measuring 2.2 x 1.7 x 1.6 cm corresponding to the suspected ectopic pregnancy on the previous exam. No free pelvic fluid or additional adnexal masses. IMPRESSION: No intrauterine pregnancy identified. Cysts within both ovaries, slightly larger on LEFT and minimally complicated on RIGHT. Mass again identified adjacent to the LEFT ovary 2.2 x 1.7 x 1.6 cm in size suspicious for ectopic pregnancy as noted on previous exam. Electronically Signed  By: Ulyses Southward M.D.   On: 04/30/2017 17:39   MAU Course  Procedures   MDM Labs and [place] ordered and reviewed. S/p Methotrexate therapy 06/28. HCG on 07/04 715. Today, decreased to 115. Patient not in any acute distress, vitals stable. No enlarging of left adnexal mass and decreasing hCG reassuring for resolving ectopic pregnancy. Presentation, clinical findings, labs/imaging discussed with Dr. Macon Large. Stable for discharge home.  Assessment and Plan  Ectopic pregnancy  Discharge home Follow up in 1 week for [facility]CG check at [facility]- discussed importance Continued abstinence until cleared by provider Ectopic pregnancy precautions given/ Return for worsening sx  Allergies as of 04/30/2017   No Known Allergies     Medication List    STOP taking these medications   hydrocortisone 1 % ointment   metroNIDAZOLE 500 MG tablet Commonly known as:  FLAGYL   nicotine polacrilex 2 MG gum Commonly known as:  NICORETTE   ondansetron 4 MG tablet Commonly known as:  ZOXWRU       Jeanie Cooks, Medical Student 04/30/2017, 4:29 PM   I confirm that I have verified the information documented in the student's  note and that I have also personally reperformed the physical exam and all medical decision making activities.  Donette Larry, CNM  04/30/2017 6:20 PM

## 2017-04-30 NOTE — MAU Note (Signed)
Patient presents with c/o heavy vaginal bleeding and abdominal pain which started last night, today having sharp pain on left side radiating to left leg, was diagnosed with ectopic received Methotrexate injection. Missed one follow up. Changing a pad every hour and a half to 2 hours.

## 2017-05-12 DIAGNOSIS — E042 Nontoxic multinodular goiter: Secondary | ICD-10-CM

## 2017-05-12 HISTORY — DX: Nontoxic multinodular goiter: E04.2

## 2017-05-24 ENCOUNTER — Ambulatory Visit (INDEPENDENT_AMBULATORY_CARE_PROVIDER_SITE_OTHER): Payer: Self-pay | Admitting: Student

## 2017-05-24 ENCOUNTER — Encounter: Payer: Self-pay | Admitting: Student

## 2017-05-24 VITALS — BP 102/62 | HR 54 | Temp 98.1°F | Ht 64.0 in | Wt 214.4 lb

## 2017-05-24 DIAGNOSIS — E049 Nontoxic goiter, unspecified: Secondary | ICD-10-CM

## 2017-05-24 DIAGNOSIS — S060X9D Concussion with loss of consciousness of unspecified duration, subsequent encounter: Secondary | ICD-10-CM

## 2017-05-24 DIAGNOSIS — E010 Iodine-deficiency related diffuse (endemic) goiter: Secondary | ICD-10-CM
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
## 2017-05-24 NOTE — Patient Instructions (Signed)
It was great seeing you today! We have addressed the following issues today 1. Concussion: Strongly recommend physical and mental rest until his symptoms resolve 2.   Thyroid enlargement: We have done some blood tests. We have also ordered an ultrasound of the thyroid. Will discuss about the next plan depending on the results  If we did any lab work today, and the results require attention, either me or my nurse will get in touch with you. If everything is normal, you will get a letter in mail and a message via . If you don't hear from [place] in two weeks, please give [place] a call. Otherwise, we look forward to seeing you again at your next visit. If you have any questions or concerns before then, please call the clinic at [phone].  Please bring all your medications to every doctors visit  Sign up for My Chart to have easy access to your labs results, and communication with your Primary care physician.    Please check-out at the front desk before leaving the clinic.    Take Care,   Dr. Alanda Slim   Concussion, Adult A concussion is a brain injury from a direct hit (blow) to the head or body. This injury causes the brain to shake quickly back and forth inside the skull. It is caused by:  A hit to the head.  A quick and sudden movement (jolt) of the head or neck.  How fast you will get better from a concussion depends on many things like how bad your concussion was, what part of your brain was hurt, how old you are, and how healthy you were before the concussion. Recovery can take time. It is important to wait to return to activity until a doctor says it is safe and your symptoms are all gone. Follow these instructions at home: Activity  Limit activities that need a lot of thought or concentration. These include: ? Homework or work for your job. ? Watching TV. ? Computer work. ? Playing memory games and puzzles.  Rest. Rest helps the brain to heal. Make sure you: ? Get plenty of sleep  at night. Do not stay up late. ? Go to bed at the same time every day. ? Rest during the day. Take naps or rest breaks when you feel tired.  It can be dangerous if you get another concussion before the first one has healed Do not do activities that could cause a second concussion, such as riding a bike or playing sports.  Ask your doctor when you can return to your normal activities, like driving, riding a bike, or using machinery. Your ability to react may be slower. Do not do these activities if you are dizzy. Your doctor will likely give you a plan for slowly going back to activities. General instructions  Take over-the-counter and prescription medicines only as told by your doctor.  Do not drink alcohol until your doctor says you can.  If it is harder than usual to remember things, write them down.  If you are easily distracted, try to do one thing at a time. For example, do not try to watch TV while making dinner.  Talk with family members or close friends when you need to make important decisions.  Watch your symptoms and tell other people to do the same. Other problems (complications) can happen after a concussion. Older adults with a brain injury may have a higher risk of serious problems, such as a blood clot in the  brain.  Tell your teachers, school nurse, school counselor, coach, athletic trainer, or work Production designer, theatre/television/filmmanager about your injury and symptoms. Tell them about what you can or cannot do. They should watch for: ? More problems with attention or concentration. ? More trouble remembering or learning new information. ? More time needed to do tasks or assignments. ? Being more annoyed (irritable) or having a harder time dealing with stress. ? Any other symptoms that get worse.  Keep all follow-up visits as told by your health care provider. This is important. Prevention  It is very important that you donot get another brain injury, especially before you have healed. In rare cases,  another injury can cause permanent brain damage, brain swelling, or death. You have the most risk if you get another head injury in the first 7-10 days after you were hurt before. To avoid injuries: ? Wear a seat belt when you ride in a car. ? Do not drink too much alcohol. ? Avoid activities that could make you get a second concussion, like contact sports. ? Wear a helmet when you do activities like:  Biking.  Skiing.  Skateboarding.  Skating. ? Make your home safe by:  Removing things from the floor or stairs that could make you trip.  Using grab bars in bathrooms and handrails by stairs.  Placing non-slip mats on floors and in bathtubs.  Putting more light in dark areas. Contact a doctor if:  Your symptoms get worse.  You have new symptoms.  You keep having symptoms for more than 2 weeks. Get help right away if:  You have bad headaches, or your headaches get worse.  You have weakness in any part of your body.  You have loss of feeling (numbness).  You feel off balance.  You keep throwing up (vomiting).  You feel more sleepy.  The black center of one eye (pupil) is bigger than the other one.  You twitch or shake violently (convulse) or have a seizure.  Your speech is not clear (is slurred).  You feel more tired, more confused, or more annoyed.  You do not recognize people or places.  You have neck pain.  It is hard to wake you up.  You have strange behavior changes.  You pass out (lose consciousness). Summary  A concussion is a brain injury from a direct hit (blow) to the head or body.  This condition is treated with rest and careful watching of symptoms.  If you keep having symptoms for more than 2 weeks, call your doctor. This information is not intended to replace advice given to you by your health care provider. Make sure you discuss any questions you have with your health care provider. Document Released: 09/16/2009 Document Revised:  09/12/2016 Document Reviewed: 09/12/2016 Elsevier Interactive Patient Education  2017 [employer]

## 2017-05-25 DIAGNOSIS — S060X9A Concussion with loss of consciousness of unspecified duration, initial encounter: Secondary | ICD-10-CM | POA: Insufficient documentation

## 2017-05-25 DIAGNOSIS — E049 Nontoxic goiter, unspecified: Secondary | ICD-10-CM | POA: Insufficient documentation

## 2017-05-25 LAB — T3: T3, Total: 111 ng/dL (ref 71–180)

## 2017-05-25 LAB — T4, FREE: Free T4: 0.98 ng/dL (ref 0.82–1.77)

## 2017-05-25 LAB — TSH: TSH: 0.999 u[IU]/mL (ref 0.450–4.500)

## 2017-05-25 NOTE — Assessment & Plan Note (Signed)
Incidental finding on her CT of concussion. Likely goiter. She has no symptoms of hypo-or hyperthyroidism. Despite tracheal deviation, no signs of respiratory or GI compromise.  -Will obtain TFT and thyroid peroxidase antibody -Thyroid US ordered.  -Will discuss further plan based on these results

## 2017-05-25 NOTE — Assessment & Plan Note (Addendum)
Neuro exam without focal deficit. Anosmia likely due to concussion. Reviewed her CT head read from [facility].  No access to real image. Discussed about the importance of cognitive and physical rest until she is completley asymptomatic at least for a week. Discussed return precautions. Follow up in two weeks

## 2017-05-27 LAB — THYROID PEROXIDASE ANTIBODY: THYROID PEROXIDASE ANTIBODY: 11 [IU]/mL (ref 0–34)

## 2017-05-27 LAB — SPECIMEN STATUS REPORT

## 2017-05-28 ENCOUNTER — Ambulatory Visit (HOSPITAL_COMMUNITY): Payer: Self-pay

## 2017-06-02 ENCOUNTER — Ambulatory Visit (HOSPITAL_COMMUNITY)
Admission: RE | Admit: 2017-06-02 | Discharge: 2017-06-02 | Disposition: A | Discharge status: Home / Self Care | Payer: Self-pay | Source: Ambulatory Visit | Attending: Family Medicine | Admitting: Family Medicine

## 2017-06-02 DIAGNOSIS — E042 Nontoxic multinodular goiter: Secondary | ICD-10-CM | POA: Insufficient documentation

## 2017-06-02 DIAGNOSIS — E010 Iodine-deficiency related diffuse (endemic) goiter: Secondary | ICD-10-CM

## 2017-06-03 ENCOUNTER — Other Ambulatory Visit: Payer: Self-pay | Admitting: Student

## 2017-06-03 ENCOUNTER — Telehealth: Payer: Self-pay | Admitting: *Deleted

## 2017-06-03 DIAGNOSIS — E042 Nontoxic multinodular goiter: Secondary | ICD-10-CM

## 2017-06-03 NOTE — Telephone Encounter (Signed)
Patient called requesting to speak with PCP. Patient asked if there was anything in particular that the provider needed to know, patient stated she just wanted to speak with her PCP.  Please call cell number on file.  Clovis Pu, RN

## 2017-06-03 NOTE — Progress Notes (Signed)
Called and discussed about the ultrasound finding with the interventional radiologist on call. Ordered FNA for nodule 1 and 5 per recommendation. Then called patient to discuss about this. Her mother picked up the phone after multiple attemptes. After I explained the [place] finding and the plan, patients mother said, "we are not interested in biopsy. We want everything removed. I work at [employer] and I have already done researches on this, and talked to some endocrinologists". Attempted to explain that biopsy doesn't mean that surgical removal is off the table completely but will give [place] a better explanation of what those nodules are, and guide [place] on the next best step. Unfortunately, she is not in agreement. So, I suggested follow up with PCP to discuss about this further. FNA needle biopsy order suspended.

## 2017-06-04 NOTE — Telephone Encounter (Signed)
Called mobile and left vm that I would call back on Monday

## 2017-06-04 NOTE — Telephone Encounter (Signed)
She called back and we discussed details.  She will let me know if she wants a referral or to proceed w bx

## 2017-06-09 ENCOUNTER — Ambulatory Visit: Payer: Medicaid Other | Admitting: Family Medicine

## 2017-06-10 NOTE — Telephone Encounter (Signed)
Patient left message on nurse line to speak with her PCP regarding her thyroid.  Please call [phone]. Clovis PuMartin, Jazzie Trampe L, RN

## 2017-06-11 NOTE — Telephone Encounter (Signed)
menstrual bleeding resolved  Would like to get a bx

## 2017-07-13 ENCOUNTER — Encounter: Payer: Self-pay | Admitting: Internal Medicine

## 2017-07-13 ENCOUNTER — Ambulatory Visit (INDEPENDENT_AMBULATORY_CARE_PROVIDER_SITE_OTHER): Payer: Self-pay | Admitting: Internal Medicine

## 2017-07-13 VITALS — BP 100/70 | HR 64 | Temp 98.0°F | Wt 203.0 lb

## 2017-07-13 DIAGNOSIS — E042 Nontoxic multinodular goiter: Secondary | ICD-10-CM

## 2017-07-13 DIAGNOSIS — E049 Nontoxic goiter, unspecified: Secondary | ICD-10-CM

## 2017-07-13 NOTE — Patient Instructions (Signed)
It was nice meeting you today Taylor Carson!  I have ordered the thyroid biopsy for you today. You will be called with the date and time of the biopsy.   I have also placed a referral for you today to a general surgeon. They also will call you with the date and time of your appointment.   If you have any questions or concerns, please feel free to call the clinic.   Be well,  Dr. Natale Milch

## 2017-07-13 NOTE — Assessment & Plan Note (Signed)
5 nodules noted on [place]. Normal labwork. Now with possible beginnings of mass effect, as patient noticing difficulty swallowing and SOB. Patient wishing to proceed with biopsy and referral to gen surg for possible removal.  - Order for thyroid biopsy placed - Referral to gen surg placed

## 2017-07-13 NOTE — Progress Notes (Signed)
   Subjective:   Patient: Taylor Carson       Birthdate: 1991/08/07       MRN: [id number]      HPI  Reed T Dais is a 26 y.o. female presenting for thyroid enlargement.   Thyroid enlargement Patient noted to have possible thyroid nodules on CT head. Since then, has had [place] which confirmed 5 nodules, with recommendations for biopsy of 3 of the 5 and f/u in one year of additional two. Patient also had full thyroid panel as well as thyroid peroxidase antibodies drawn, all of which were WNL. Patient returns today because she says she is now having some difficulty swallowing and SOB for the past week. Has had three episodes of difficulty swallowing liquids. Has not regurgitated, but gags a bit. Has also had some SOB, feeling as if her throat is narrow. She does smoke cigarettes and think this may be contributing, but is unsure. Endorses some tenderness of thyroid on L.    Smoking status reviewed. Patient is current every day smoker.   Review of Systems See HPI.     Objective:  Physical Exam  Constitutional: She is oriented to person, place, and time and well-developed, well-nourished, and in no distress.  HENT:  Uvula midline, no tonsillar enlargement, MMM, no oropharyngeal erythema or exudates  Eyes: Pupils are equal, round, and reactive to light. Conjunctivae and EOM are normal. Right eye exhibits no discharge. Left eye exhibits no discharge.  Neck:  Significant thyromegaly with TTP on L  Pulmonary/Chest: Effort normal. No respiratory distress.  Neurological: She is alert and oriented to person, place, and time.  Skin: Skin is warm and dry.  Psychiatric: Affect and judgment normal.      Assessment & Plan:  Enlarged thyroid gland 5 nodules noted on [place]. Normal labwork. Now with possible beginnings of mass effect, as patient noticing difficulty swallowing and SOB. Patient wishing to proceed with biopsy and referral to gen surg for possible removal.  - Order for thyroid biopsy  placed - Referral to gen surg placed  Precepted with Dr. Lum Babe.   Tarri Abernethy, MD, MPH PGY-3 [facility] Pager [phone]

## 2017-07-16 ENCOUNTER — Emergency Department (HOSPITAL_COMMUNITY)
Admission: EM | Admit: 2017-07-16 | Discharge: 2017-07-16 | Disposition: A | Discharge status: Home / Self Care | Payer: Self-pay | Attending: Emergency Medicine | Admitting: Emergency Medicine

## 2017-07-16 ENCOUNTER — Encounter (HOSPITAL_COMMUNITY): Payer: Self-pay | Admitting: Emergency Medicine

## 2017-07-16 DIAGNOSIS — R1310 Dysphagia, unspecified: Secondary | ICD-10-CM | POA: Insufficient documentation

## 2017-07-16 DIAGNOSIS — Z5321 Procedure and treatment not carried out due to patient leaving prior to being seen by health care provider: Secondary | ICD-10-CM | POA: Insufficient documentation

## 2017-07-16 DIAGNOSIS — R070 Pain in throat: Secondary | ICD-10-CM | POA: Insufficient documentation

## 2017-07-16 NOTE — ED Notes (Signed)
Patient has decided to leave, states that she will come back in the morning.

## 2017-07-16 NOTE — ED Triage Notes (Signed)
Pt st's she was dx with thyroid problems 2 months ago.  St's she is suppose to have a biopsy done but has not been scheduled yet.  Pt c/o pain in her throat and unable to swallow her food

## 2017-07-26 ENCOUNTER — Other Ambulatory Visit: Payer: Self-pay | Admitting: *Deleted

## 2017-07-26 DIAGNOSIS — E041 Nontoxic single thyroid nodule: Secondary | ICD-10-CM

## 2017-07-28 ENCOUNTER — Ambulatory Visit (INDEPENDENT_AMBULATORY_CARE_PROVIDER_SITE_OTHER): Payer: Self-pay | Admitting: Family Medicine

## 2017-07-28 ENCOUNTER — Encounter: Payer: Self-pay | Admitting: Family Medicine

## 2017-07-28 ENCOUNTER — Encounter: Payer: Self-pay | Admitting: Licensed Clinical Social Worker

## 2017-07-28 VITALS — BP 114/72 | HR 70 | Temp 98.4°F | Ht 64.0 in | Wt 201.2 lb

## 2017-07-28 DIAGNOSIS — N926 Irregular menstruation, unspecified: Secondary | ICD-10-CM

## 2017-07-28 DIAGNOSIS — Z309 Encounter for contraceptive management, unspecified: Secondary | ICD-10-CM

## 2017-07-28 DIAGNOSIS — F329 Major depressive disorder, single episode, unspecified: Secondary | ICD-10-CM | POA: Insufficient documentation

## 2017-07-28 DIAGNOSIS — F39 Unspecified mood [affective] disorder: Secondary | ICD-10-CM | POA: Insufficient documentation

## 2017-07-28 DIAGNOSIS — E049 Nontoxic goiter, unspecified: Secondary | ICD-10-CM

## 2017-07-28 DIAGNOSIS — F3289 Other specified depressive episodes: Secondary | ICD-10-CM

## 2017-07-28 LAB — POCT URINE PREGNANCY: Preg Test, Ur: NEGATIVE

## 2017-07-28 MED ORDER — FLUOXETINE HCL 20 MG PO TABS: 20.0000 mg | ORAL_TABLET | Freq: Every day | ORAL | 0 refills | Status: DC

## 2017-07-28 NOTE — Progress Notes (Signed)
Subjective  Patient is presenting with the following illnesses  THYROMEGALY Continues to have symptoms of food sticking and nausea and occaisional vomiting but less than last visit No weight or temperature sensation changes   DEPRESSION Disease Monitoring Current symptoms include anhedonia, depressed mood, difficulty concentrating, fatigue and feelings of worthlessness/guilt   These have been for almost a year.  Never had treatment or counseling other than ER visit          Symptoms have been unchanged     Is Exercising no  Evidence of suicidal ideation: no  CONTRACEPTION Is sex active no contraception.  May want to have a child but not until current symptoms are better controlled   Medication Monitoring Compliance: was prescribed lamictal and prozac but never picked up - too expensive  ROS - See HPI     PMH Previous treatment includes: none   No know FHx of depression   Chief Complaint noted Review of Symptoms - see HPI PMH - Smoking status noted.     Objective Vital Signs reviewed Psych:  Cognition and judgment appear intact. Alert, communicative  and cooperative with normal attention span and concentration. No apparent delusions, illusions, hallucinations Neck - diffuse thyromegaly No signs of dehydration    Assessments/Plans  Depression PHQ9 markedly positive except for SI.  During interview endorses these symptoms but they do not sound as severe as indicated on PHQ9 and she presents her self without active depression.  Relates would call her mother or boyfriend if she felt ideas about suicide.  IC to see today and start SSRI  Contraception management Recommended condoms until work up for thyromegaly complete   Enlarged thyroid gland Stable but with compression symptoms.  Will ensure biopsy and surgery referral are in play    See after visit summary for details of patient instuctions

## 2017-07-28 NOTE — Patient Instructions (Signed)
Nice to meet you  Both surgery and radiology for the biopsy should be contacting you  Use condoms every time  Start a multivitamin every day  Take prozac 20 mg every day  Come back to see me in 1-2 weeks

## 2017-07-28 NOTE — Assessment & Plan Note (Addendum)
PHQ9 markedly positive except for SI.  During interview endorses these symptoms but they do not sound as severe as indicated on PHQ9 and she presents her self without active depression.  Relates would call her mother or boyfriend if she felt ideas about suicide.  IC to see today and start SSRI

## 2017-07-28 NOTE — Assessment & Plan Note (Signed)
Stable but with compression symptoms.  Will ensure biopsy and surgery referral are in play

## 2017-07-28 NOTE — Assessment & Plan Note (Signed)
Recommended condoms until work up for thyromegaly complete
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
## 2017-07-29 LAB — T4, FREE: Free T4: 1.13 ng/dL (ref 0.82–1.77)

## 2017-07-29 LAB — TSH: TSH: 0.775 u[IU]/mL (ref 0.450–4.500)

## 2017-08-04 ENCOUNTER — Telehealth: Payer: Self-pay | Admitting: Licensed Clinical Social Worker

## 2017-08-04 NOTE — Progress Notes (Signed)
  Integrated Behavioral F/U Phone Note  MRN: [id number] NAME: Taylor Carson Integrated Care f/u phone call to patient reference interventions discussed and resources provided during joint visit with PCP. [facility] and [facility]   *Patient has been unable to get all medications filled as prescribed: No: states she keeps forgetting to pick it up.  Reviewed barriers of cost and transportation.  Patient denies barriers. *Patient advised to schedule appointment with provider for evaluation of medication side effects or additional concerns: Yes- informed patient to schedule F/U with PCP  PCP updated via in-basket.  Plan: Patient states she will pick up prozac and schedule F/U appointment with PCP  Current Medications:  Outpatient Medications Prior to Visit  Medication Sig Dispense Refill  . FLUoxetine (PROZAC) 20 MG tablet Take 1 tablet (20 mg total) by mouth daily. 30 tablet 0  . hydrOXYzine (VISTARIL) 25 MG capsule Take 25 mg by mouth.    . lamoTRIgine (LAMICTAL) 100 MG tablet Take 100 mg by mouth.    Burnis Medin. Prenat Vit-Fe Gly Cys-FA-Omega (ENBRACE HR) CAPS 1 a day     No facility-administered medications prior to visit.    Sammuel Hineseborah Aliannah Holstrom, LCSW Licensed Clinical Social Worker [facility]   [phone] 2:47 PM

## 2017-08-11 ENCOUNTER — Ambulatory Visit
Admission: RE | Admit: 2017-08-11 | Discharge: 2017-08-11 | Disposition: A | Discharge status: Home / Self Care | Payer: Medicaid Other | Source: Ambulatory Visit | Attending: Family Medicine | Admitting: Family Medicine

## 2017-08-11 ENCOUNTER — Other Ambulatory Visit (HOSPITAL_COMMUNITY)
Admission: RE | Admit: 2017-08-11 | Discharge: 2017-08-11 | Disposition: A | Discharge status: Home / Self Care | Payer: Medicaid Other | Source: Ambulatory Visit | Attending: Physician Assistant | Admitting: Physician Assistant

## 2017-08-11 DIAGNOSIS — E049 Nontoxic goiter, unspecified: Secondary | ICD-10-CM

## 2017-08-11 DIAGNOSIS — E042 Nontoxic multinodular goiter: Secondary | ICD-10-CM

## 2017-08-11 NOTE — Procedures (Signed)
PROCEDURE SUMMARY:  Using direct ultrasound guidance, 3 passes were made using 25 g needles into each of the nodules within the the left and the right lobe of the thyroid.   Ultrasound was used to confirm needle placements on all occasions.   Specimens were sent to Pathology for analysis.  Gilman Olazabal S Ibn Stief PA-C 08/11/2017 3:51 PM

## 2017-08-13 ENCOUNTER — Encounter: Payer: Self-pay | Admitting: Family Medicine

## 2017-09-15 ENCOUNTER — Encounter: Payer: Self-pay | Admitting: Licensed Clinical Social Worker

## 2017-09-15 ENCOUNTER — Other Ambulatory Visit: Payer: Self-pay

## 2017-09-15 ENCOUNTER — Ambulatory Visit (INDEPENDENT_AMBULATORY_CARE_PROVIDER_SITE_OTHER): Payer: Self-pay | Admitting: Family Medicine

## 2017-09-15 ENCOUNTER — Encounter: Payer: Self-pay | Admitting: Family Medicine

## 2017-09-15 VITALS — BP 102/80 | HR 58 | Temp 98.4°F | Wt 201.0 lb

## 2017-09-15 DIAGNOSIS — N926 Irregular menstruation, unspecified: Secondary | ICD-10-CM

## 2017-09-15 DIAGNOSIS — F3289 Other specified depressive episodes: Secondary | ICD-10-CM

## 2017-09-15 DIAGNOSIS — Z23 Encounter for immunization: Secondary | ICD-10-CM

## 2017-09-15 DIAGNOSIS — E049 Nontoxic goiter, unspecified: Secondary | ICD-10-CM

## 2017-09-15 LAB — POCT URINE PREGNANCY: Preg Test, Ur: NEGATIVE

## 2017-09-15 MED ORDER — NORGESTIMATE-ETH ESTRADIOL 0.25-35 MG-MCG PO TABS: 1.0000 | ORAL_TABLET | Freq: Every day | ORAL | 11 refills | Status: DC

## 2017-09-15 NOTE — Progress Notes (Signed)
Subjective  Patient is presenting with the following illnesses  Subjective  Patient is presenting with the following illnesses  THYROMEGALY Continues to have symptoms of food sticking unchanged  No weight or temperature sensation changes.  [facility] but did not call back  DEPRESSION Disease Monitoring PHQ9 is 20 but she feels her depression is better and Mom does also.  No suicidal ideation.  Did not start prozac.  Would like to do counseling.     IRREGULAR MP For several months.  Last TSH was normal.  Intermittently sexually active.  Has used depo in past.  No history of blood clots.  Smokes 6 per day   Medication Monitoring Compliance: was prescribed lamictal and prozac but never picked up - too expensive  ROS - See HPI   PMH Previous treatment includes: none   No know FHx of depression   Chief Complaint noted Review of Symptoms - see HPI PMH - Smoking status noted.     Objective Vital Signs reviewed Psych:  Cognition and judgment appear intact. Alert, communicative  and cooperative with normal attention span and concentration. No apparent delusions, illusions, hallucinations Neck - diffuse thyromegaly No signs of dehydration    Assessments/Plans  Irregular menses Seems anovulatory - will treat with bcp as needs contraception   Depression Her PHQ9 is high but during interview and from her mom's perspective she is doing pretty well.  See after visit summary - She does not want to take medications.    Enlarged thyroid gland Will retry referral.  Does not have emergent symptoms

## 2017-09-15 NOTE — Assessment & Plan Note (Signed)
Her PHQ9 is high but during interview and from her mom's perspective she is doing pretty well.  See after visit summary - She does not want to take medications.

## 2017-09-15 NOTE — Patient Instructions (Signed)
Good to see you today!  Thanks for coming in.  Depression - see Gavin PoundDeborah set up a regular counseling.   If you are feeling worse you will Tell mom and call me or Gavin PoundDeborah  Thyroid - I will put in another referral.  When they call set up an appointment   Menstrual periods - start the bcp.   Take one every day at the same time if possible - Set your phone  Smoking - find a substitute

## 2017-09-15 NOTE — Assessment & Plan Note (Signed)
Will retry referral.  Does not have emergent symptoms

## 2017-09-15 NOTE — Assessment & Plan Note (Signed)
Seems anovulatory - will treat with bcp as needs contraception

## 2017-09-15 NOTE — Progress Notes (Signed)
  Type of Service: Integrated Behavioral Health warm handoff  Estimate time:30 minutes : Interpreter:No.  SUBJECTIVE: Taylor Carson is a 26 y.o. female referred by Dr. Deirdre Priesthambliss for assistance with managing symptoms of  depression and other stressors. Patient reports sleep difficulties, crying for no identified reason and feeling that she has let others down.  Patient has decided she does not want to take medication at this time and wants to pursue therapy. Duration of problem: progressed over the past 3 months; no hx of therapy  LIFE CONTEXT:  Family & Social:patient lives with mother ,   Work /Fun: no longer employed looking for employment  Life changes: relationship break up; moved in with mom; lost job  GOALS: Patient will reduce symptoms of: depression, and increase  ability WU:JWJXBJof:coping skills and self-management skills.  INTERVENTION: Behavioral Therapy (Relaxed breathing), [employer], Problem-solving teaching/coping strategies and Psychoeducation  and Sleep Hygiene,:       PHQ 9=20, indication of: severe depression. Patient reports no SI have thoughts "I don't want to be here, in this situation, but would not take my life".     ISSUES DISCUSSED: Integrated care services, support system, previous and current coping skills, community resources , things patient enjoy or use to enjoy doing, Affordable Care Act for insurance and [employer] resource for ongoing therapy.    ASSESSMENT:Patient currently experiencing symptoms of depression.  Symptoms exacerbated by life transitions with recent breakup  housing and job. Patient may benefit from, and is in agreement to receive further assessment and brief therapeutic interventions to assist with managing her symptoms until she is connect to ongoing therapy.  PLAN:   1.Patient will F/U with LCSW in 2 weeks  2. LCSW will F/U with phone call in one week  3. Behavioral recommendations: relaxed breathing; sleep hygiene check list  4.  Referral:Counselor and Affordable Care Act,   Warm Hand Off Completed.     Sammuel Hineseborah Itzamara Casas, LCSW Licensed Clinical Social Worker [facility]   [phone] 12:48 PM

## 2017-09-22 ENCOUNTER — Telehealth: Payer: Self-pay | Admitting: Licensed Clinical Social Worker

## 2017-09-22 NOTE — Progress Notes (Signed)
Service : Integrated Care F/U Call   F/U call to patient reference interventions discussed and resources provided during warm hand off from PCP.  Patient reports she is doing well.   1.  Implemented the relaxed breathing and sleep hygiene interventions.  2. Completed step for the affordable care act and will have insurance starting Jan 2019.  Patient appreciative of F/U call and would like to schedule an appointment  Plan: Integrated Behavioral Health F/U appointment scheduled for next week.  Sammuel Hineseborah Raffi Milstein, LCSW Licensed Clinical Social Worker [facility]   [phone] 11:50 AM

## 2017-09-30 ENCOUNTER — Ambulatory Visit: Payer: Medicaid Other

## 2017-10-01 ENCOUNTER — Telehealth: Payer: Self-pay | Admitting: *Deleted

## 2017-10-01 NOTE — Telephone Encounter (Signed)
Patient left message on nurse line requesting return call from PCP to discuss thyroid. Taylor Carson. Jazzlynn Rawe, RN, BSN

## 2017-10-02 ENCOUNTER — Other Ambulatory Visit: Payer: Self-pay

## 2017-10-02 ENCOUNTER — Encounter (HOSPITAL_COMMUNITY): Payer: Self-pay

## 2017-10-02 ENCOUNTER — Emergency Department (HOSPITAL_COMMUNITY)
Admission: EM | Admit: 2017-10-02 | Discharge: 2017-10-02 | Disposition: A | Discharge status: Home / Self Care | Payer: Medicaid Other | Attending: Emergency Medicine | Admitting: Emergency Medicine

## 2017-10-02 DIAGNOSIS — Z79899 Other long term (current) drug therapy: Secondary | ICD-10-CM | POA: Diagnosis not present

## 2017-10-02 DIAGNOSIS — F329 Major depressive disorder, single episode, unspecified: Secondary | ICD-10-CM | POA: Insufficient documentation

## 2017-10-02 DIAGNOSIS — F17210 Nicotine dependence, cigarettes, uncomplicated: Secondary | ICD-10-CM | POA: Insufficient documentation

## 2017-10-02 DIAGNOSIS — J029 Acute pharyngitis, unspecified: Secondary | ICD-10-CM | POA: Diagnosis present

## 2017-10-02 LAB — RAPID STREP SCREEN (MED CTR MEBANE ONLY): Streptococcus, Group A Screen (Direct): NEGATIVE

## 2017-10-02 MED ORDER — IBUPROFEN 400 MG PO TABS
600.0000 mg | ORAL_TABLET | Freq: Once | ORAL | Status: AC
  Administered 2017-10-02: 16:00:00 600 mg via ORAL
  Filled 2017-10-02: qty 1

## 2017-10-02 NOTE — ED Provider Notes (Signed)
[facility] Provider Note   CSN: [id number] Arrival date & time: 10/02/17  1244     History   Chief Complaint Chief Complaint  Patient presents with  . Sore Throat    HPI Taylor Carson is a 26 y.o. female.  Patient with no related medical history presents with sore throat for a few days. No difficulty breathing. No sick contacts. Vaccines up-to-date. Patient is being followed for thyroid concerns at this time.      Past Medical History:  Diagnosis Date  . Ectopic pregnancy 03/2017    Patient Active Problem List   Diagnosis Date Noted  . Depression 07/28/2017  . Enlarged thyroid gland 05/25/2017  . Ectopic pregnancy without intrauterine pregnancy 04/08/2017  . Nicotine dependence 05/27/2015  . Irregular menses 02/28/2013    Past Surgical History:  Procedure Laterality Date  . NO PAST SURGERIES      OB History    Gravida Para Term Preterm AB Living   1             SAB TAB Ectopic Multiple Live Births                   Home Medications    Prior to Admission medications   Medication Sig Start Date End Date Taking? Authorizing Provider  FLUoxetine (PROZAC) 20 MG tablet Take 1 tablet (20 mg total) by mouth daily. Patient not taking: Reported on 10/02/2017 07/28/17   Carney Livinghambliss, Marshall L, MD  lamoTRIgine (LAMICTAL) 100 MG tablet Take 100 mg by mouth.    [provider]  norgestimate-ethinyl estradiol (SPRINTEC 28) 0.25-35 MG-MCG tablet Take 1 tablet by mouth daily. 09/15/17   Carney Livinghambliss, Marshall L, MD  Prenat Vit-Fe Gly Cys-FA-Omega (ENBRACE HR) CAPS 1 a day 05/27/17   [provider]    Family History History reviewed. No pertinent family history.  Social History Social History   Tobacco Use  . Smoking status: Current Every Day Smoker    Packs/day: 0.25    Types: Cigarettes    Last attempt to quit: 08/25/2016    Years since quitting: 1.1  . Smokeless tobacco: Never Used  Substance Use  Topics  . Alcohol use: No  . Drug use: Yes    Types: Marijuana     Allergies   Patient has no known allergies.   Review of Systems Review of Systems  Constitutional: Negative for fever.  HENT: Positive for sore throat.   Cardiovascular: Negative for chest pain.  Neurological: Negative for light-headedness.     Physical Exam Updated Vital Signs BP 117/85   Pulse 77   Temp 98 F (36.7 C) (Oral)   Resp 20   LMP 09/13/2017   SpO2 100%   Physical Exam  Constitutional: She is oriented to person, place, and time. She appears well-developed and well-nourished.  HENT:  Head: Normocephalic and atraumatic.  Mouth/Throat: No tonsillar exudate (enlarged tonsils, no signs of abscess, no trismus).  Eyes: Conjunctivae are normal. Right eye exhibits no discharge. Left eye exhibits no discharge.  Neck: Normal range of motion. Neck supple. No tracheal deviation present.  Cardiovascular: Normal rate.  Pulmonary/Chest: Effort normal and breath sounds normal.  Musculoskeletal: She exhibits no edema.  Lymphadenopathy:    She has cervical adenopathy.  Neurological: She is alert and oriented to person, place, and time.  Skin: Skin is warm. No rash noted.  Psychiatric: She has a normal mood and affect.  Nursing note and vitals reviewed.  ED Treatments / Results  Labs (all labs ordered are listed, but only abnormal results are displayed) Labs Reviewed  RAPID STREP SCREEN (NOT AT [facility])  CULTURE, GROUP A STREP [facility]    EKG  EKG Interpretation None       Radiology No results found.  Procedures Procedures (including critical care time)  Medications Ordered in ED Medications  ibuprofen (ADVIL,MOTRIN) tablet 600 mg (600 mg Oral Given 10/02/17 1606)     Initial Impression / Assessment and Plan / ED Course  I have reviewed the triage vital signs and the nursing notes.  Pertinent labs & imaging results that were available during my care of the patient were reviewed by me  and considered in my medical decision making (see chart for details).     Patient presents with pharyngitis. No signs of abscess. Patient well-appearing otherwise. Strep test negative discussed supportive care.  Results and differential diagnosis were discussed with the patient/parent/guardian. Xrays were independently reviewed by myself.  Close follow up outpatient was discussed, comfortable with the plan.   Medications  ibuprofen (ADVIL,MOTRIN) tablet 600 mg (600 mg Oral Given 10/02/17 1606)    Vitals:   10/02/17 1258 10/02/17 1531 10/02/17 1600 10/02/17 1630  BP: 127/87 127/87 117/85 101/81  Pulse: 80 72 77 83  Resp: 16 20    Temp: 98.9 F (37.2 C) 98 F (36.7 C)    TempSrc: Oral Oral    SpO2: 100% 100% 100% 97%    Final diagnoses:  Viral pharyngitis     Final Clinical Impressions(s) / ED Diagnoses   Final diagnoses:  Viral pharyngitis    ED Discharge Orders    None       Blane OharaZavitz, Eldana Isip, MD 10/02/17 1731

## 2017-10-02 NOTE — Discharge Instructions (Signed)
Use tylenol and motrin as needed every 6 hrs.  If you were given medicines take as directed.  If you are on coumadin or contraceptives realize their levels and effectiveness is altered by many different medicines.  If you have any reaction (rash, tongues swelling, other) to the medicines stop taking and see a physician.    If your blood pressure was elevated in the ER make sure you follow up for management with a primary doctor or return for chest pain, shortness of breath or stroke symptoms.  Please follow up as directed and return to the ER or see a physician for new or worsening symptoms.  Thank you. Vitals:   10/02/17 1258 10/02/17 1531 10/02/17 1600  BP: 127/87 127/87 117/85  Pulse: 80 72 77  Resp: 16 20   Temp: 98.9 F (37.2 C) 98 F (36.7 C)   TempSrc: Oral Oral   SpO2: 100% 100% 100%

## 2017-10-02 NOTE — ED Notes (Signed)
Got patient into a gown on the monitor patient is resting with call bell in reach 

## 2017-10-02 NOTE — ED Triage Notes (Signed)
Pt states she has hx of hyperthyroidism. States she fells as though there is a lump in her throat preventing here from PO intake and swallowing. Voice quality clear, no distress noted. Possibly some swelling to tonsils, airway intact.

## 2017-10-04 LAB — CULTURE, GROUP A STREP (THRC)

## 2017-10-21 NOTE — Telephone Encounter (Signed)
Left vm on cell about calling us to give time and number to contact re referral

## 2017-10-22 NOTE — Telephone Encounter (Signed)
Spoke with her.  Feeling ok.  No menstrual periods yet, depression is stable  Has not heard from [facility] Now has insurance through Obamacare Asked her to contact [facility] to see if they will see her If not let us know and would refer to [facility] to evaluate symptomatic thyromegaly

## 2017-10-22 NOTE — Telephone Encounter (Signed)
Left VM again

## 2017-10-25 ENCOUNTER — Encounter: Payer: Self-pay | Admitting: Family Medicine

## 2017-10-25 ENCOUNTER — Other Ambulatory Visit: Payer: Self-pay

## 2017-10-25 ENCOUNTER — Ambulatory Visit (INDEPENDENT_AMBULATORY_CARE_PROVIDER_SITE_OTHER): Payer: Medicaid Other | Admitting: Family Medicine

## 2017-10-25 VITALS — BP 126/64 | HR 72 | Temp 98.5°F | Ht 64.0 in | Wt 193.6 lb

## 2017-10-25 DIAGNOSIS — Z3201 Encounter for pregnancy test, result positive: Secondary | ICD-10-CM | POA: Diagnosis not present

## 2017-10-25 DIAGNOSIS — R112 Nausea with vomiting, unspecified: Secondary | ICD-10-CM | POA: Diagnosis not present

## 2017-10-25 DIAGNOSIS — Z3490 Encounter for supervision of normal pregnancy, unspecified, unspecified trimester: Secondary | ICD-10-CM

## 2017-10-25 LAB — POCT URINE PREGNANCY: PREG TEST UR: POSITIVE — AB

## 2017-10-25 MED ORDER — PRENATAL ADULT GUMMY/DHA/FA 0.4-25 MG PO CHEW: 1.0000 | CHEWABLE_TABLET | Freq: Every day | ORAL | 2 refills | Status: DC

## 2017-10-25 NOTE — Patient Instructions (Signed)
Congratulations!  Please come in tomorrow to get the lab work. Tomorrow also stop by the front desk to schedule your first prenatal appointment for your 1st OB visit.

## 2017-10-25 NOTE — Progress Notes (Signed)
Subjective:    Patient ID: Taylor Carson , female   DOB: 06/11/1991 , 27 y.o..   MRN: [id number]  HPI  Taylor Carson is depression and irregular menses here for  Chief Complaint  Patient presents with  . Emesis    1. VOMITING  Vomiting began 4 days ago. Progression:   Medications tried: Tried Zofran at home today, states did not help.  Recent travel: None  Recent sick contacts: None Ingested suspicious foods: No  Immunocompromised: No She notes that she works at [employer] and she was able to eat a potatoe and piece of steak on Saturday. Yesterday she had an omelette which she ate the majority of, a few hours later she threw up. She has been able to drink ginger ale and lemon water. She has urinated more than twice today.   Symptoms Diarrhea: none Abdominal pain: no but does endorse cramps Blood in vomit: none Weight loss: none Decreased urine output:no Lightheadedness: no Fever: no Bloody stools: no Meds: only on birth control  ROS see HPI Smoking Status noted   Past Medical History: Patient Active Problem List   Diagnosis Date Noted  . Depression 07/28/2017  . Enlarged thyroid gland 05/25/2017  . Ectopic pregnancy without intrauterine pregnancy 04/08/2017  . Nicotine dependence 05/27/2015  . Irregular menses 02/28/2013    Medications: reviewed and updated Current Outpatient Medications  Medication Sig Dispense Refill  . FLUoxetine (PROZAC) 20 MG tablet Take 1 tablet (20 mg total) by mouth daily. (Patient not taking: Reported on 10/02/2017) 30 tablet 0  . lamoTRIgine (LAMICTAL) 100 MG tablet Take 100 mg by mouth.    . Prenatal MV & Min w/FA-DHA (PRENATAL ADULT GUMMY/DHA/FA) 0.4-25 MG CHEW Chew 1 tablet by mouth daily. 90 tablet 2   No current facility-administered medications for this visit.     Social Hx:  reports that she has been smoking cigarettes.  She has been smoking about 0.25 packs per day. she has never used smokeless tobacco.     Objective:   BP 126/64   Pulse 72   Temp 98.5 F (36.9 C) (Oral)   Ht 5\' 4"  (1.626 m)   Wt 193 lb 9.6 oz (87.8 kg)   LMP 09/13/2017 (Exact Date)   SpO2 99%   BMI 33.23 kg/m  Physical Exam  Gen: NAD, alert, cooperative with exam, well-appearing HEENT: moist mucous membranes Cardiac: Regular rate and rhythm, normal S1/S2 Respiratory: Clear to auscultation bilaterally, no wheezes, non-labored breathing Gastrointestinal: soft, non distended, bowel sounds present, slight tenderness in epigastric area Psych: good insight, normal mood and affect  Results for orders placed or performed in visit on 10/25/17  POCT urine pregnancy  Result Value Ref Range   Preg Test, Ur Positive (A) Negative    Assessment & Plan:  Positive pregnancy test; This is the reason patient has been nauseous and has had episodes of emesis.  This is a unplanned but welcomed pregnancy.  Patient would like to proceed with getting prenatal care at our office.  Prenatal labs ordered and patient will schedule an appointment for her first OB visit.  Discussed daily prenatal vitamins and taking Diclegis PRN for nausea.  Orders Placed This Encounter  Procedures  . Culture, OB Urine    Standing Status:   Future    Standing Expiration Date:   10/25/2018  . Obstetric Panel, Including HIV([facility])    Standing Status:   Future    Standing Expiration Date:   10/25/2018  . Sickle cell  screen    Standing Status:   Future    Standing Expiration Date:   10/25/2018  . POCT urine pregnancy   Meds ordered this encounter  Medications  . Prenatal MV & Min w/FA-DHA (PRENATAL ADULT GUMMY/DHA/FA) 0.4-25 MG CHEW    Sig: Chew 1 tablet by mouth daily.    Dispense:  90 tablet    Refill:  2    Anders Simmonds, MD [facility], PGY-3

## 2017-10-26 ENCOUNTER — Other Ambulatory Visit: Payer: Medicaid Other

## 2017-10-26 DIAGNOSIS — Z3490 Encounter for supervision of normal pregnancy, unspecified, unspecified trimester: Secondary | ICD-10-CM

## 2017-10-27 LAB — OBSTETRIC PANEL, INCLUDING HIV
ANTIBODY SCREEN: NEGATIVE
BASOS: 0 %
Basophils Absolute: 0 10*3/uL (ref 0.0–0.2)
EOS (ABSOLUTE): 0.1 10*3/uL (ref 0.0–0.4)
EOS: 1 %
HEMATOCRIT: 40 % (ref 34.0–46.6)
HEP B S AG: NEGATIVE
HIV Screen 4th Generation wRfx: NONREACTIVE
Hemoglobin: 13 g/dL (ref 11.1–15.9)
Immature Grans (Abs): 0 10*3/uL (ref 0.0–0.1)
Immature Granulocytes: 0 %
Lymphocytes Absolute: 1.8 10*3/uL (ref 0.7–3.1)
Lymphs: 29 %
MCH: 27.7 pg (ref 26.6–33.0)
MCHC: 32.5 g/dL (ref 31.5–35.7)
MCV: 85 fL (ref 79–97)
MONOCYTES: 7 %
Monocytes Absolute: 0.4 10*3/uL (ref 0.1–0.9)
NEUTROS ABS: 3.8 10*3/uL (ref 1.4–7.0)
Neutrophils: 63 %
Platelets: 342 10*3/uL (ref 150–379)
RBC: 4.69 x10E6/uL (ref 3.77–5.28)
RDW: 14.1 % (ref 12.3–15.4)
RPR: NONREACTIVE
RUBELLA: 1.58 {index} (ref >0.99)
Rh Factor: POSITIVE
WBC: 6 10*3/uL (ref 3.4–10.8)

## 2017-10-28 ENCOUNTER — Other Ambulatory Visit: Payer: Self-pay | Admitting: *Deleted

## 2017-10-28 LAB — SICKLE CELL SCREEN: Sickle Cell Screen: NEGATIVE

## 2017-10-28 LAB — CULTURE, OB URINE

## 2017-10-28 LAB — URINE CULTURE, OB REFLEX

## 2017-10-28 MED ORDER — ALIVE PRENATAL 0.4-25 MG PO CHEW: 1.0000 | CHEWABLE_TABLET | Freq: Every day | ORAL | 3 refills | Status: DC

## 2017-10-28 NOTE — Telephone Encounter (Signed)
I am covering for Dr. Deirdre Priesthambliss who is away from the office.  Sent new rx in  Taylor Carson, Estevan RyderBrittany J, MD

## 2017-10-28 NOTE — Telephone Encounter (Signed)
Rx request from [facility] to change prenatals. They wanted something covered by her insurance. Alive Prenatal Multi-vit DHA Gummy (chew and swallow on tablet daily). Please advise. Gautham Hewins Bruna PotterBlount, CMA

## 2017-10-29 ENCOUNTER — Other Ambulatory Visit: Payer: Self-pay

## 2017-10-29 ENCOUNTER — Encounter (HOSPITAL_COMMUNITY): Payer: Self-pay | Admitting: *Deleted

## 2017-10-29 ENCOUNTER — Inpatient Hospital Stay (HOSPITAL_COMMUNITY)
Admission: AD | Admit: 2017-10-29 | Discharge: 2017-10-29 | Disposition: A | Discharge status: Home / Self Care | Payer: Medicaid Other | Source: Ambulatory Visit | Attending: Obstetrics and Gynecology | Admitting: Obstetrics and Gynecology

## 2017-10-29 DIAGNOSIS — O219 Vomiting of pregnancy, unspecified: Secondary | ICD-10-CM | POA: Diagnosis not present

## 2017-10-29 DIAGNOSIS — Z3A01 Less than 8 weeks gestation of pregnancy: Secondary | ICD-10-CM | POA: Diagnosis not present

## 2017-10-29 DIAGNOSIS — R1110 Vomiting, unspecified: Secondary | ICD-10-CM | POA: Diagnosis not present

## 2017-10-29 LAB — URINALYSIS, ROUTINE W REFLEX MICROSCOPIC
Bilirubin Urine: NEGATIVE
Glucose, UA: NEGATIVE mg/dL
Hgb urine dipstick: NEGATIVE
Ketones, ur: 80 mg/dL — AB
Leukocytes, UA: NEGATIVE
Nitrite: NEGATIVE
Protein, ur: 30 mg/dL — AB
Specific Gravity, Urine: 1.032 — ABNORMAL HIGH (ref 1.005–1.030)
pH: 5 (ref 5.0–8.0)

## 2017-10-29 MED ORDER — PROMETHAZINE HCL 25 MG/ML IJ SOLN
25.0000 mg | Freq: Once | INTRAMUSCULAR | Status: AC
  Administered 2017-10-29: 25 mg via INTRAVENOUS
  Filled 2017-10-29: qty 1

## 2017-10-29 MED ORDER — M.V.I. ADULT IV INJ
Freq: Once | INTRAVENOUS | Status: AC
  Administered 2017-10-29: 19:00:00 via INTRAVENOUS
  Filled 2017-10-29: qty 10

## 2017-10-29 MED ORDER — PROMETHAZINE HCL 25 MG PO TABS: 25.0000 mg | ORAL_TABLET | Freq: Four times a day (QID) | ORAL | 2 refills | Status: DC | PRN

## 2017-10-29 MED ORDER — LACTATED RINGERS IV BOLUS (SEPSIS)
1000.0000 mL | Freq: Once | INTRAVENOUS | Status: AC
  Administered 2017-10-29: 1000 mL via INTRAVENOUS

## 2017-10-29 NOTE — MAU Note (Signed)
Found out she was preg, had been throwing up for 4 days prior.  Told them that,they did not give her any meds, only prenatal vits.

## 2017-10-29 NOTE — Discharge Instructions (Signed)
Morning Sickness °Morning sickness is when you feel sick to your stomach (nauseous) during pregnancy. This nauseous feeling may or may not come with vomiting. It often occurs in the morning but can be a problem any time of day. Morning sickness is most common during the first trimester, but it may continue throughout pregnancy. While morning sickness is unpleasant, it is usually harmless unless you develop severe and continual vomiting (hyperemesis gravidarum). This condition requires more intense treatment. °What are the causes? °The cause of morning sickness is not completely known but seems to be related to normal hormonal changes that occur in pregnancy. °What increases the risk? °You are at greater risk if you: °· Experienced nausea or vomiting before your pregnancy. °· Had morning sickness during a previous pregnancy. °· Are pregnant with more than one baby, such as twins. ° °How is this treated? °Do not use any medicines (prescription, over-the-counter, or herbal) for morning sickness without first talking to your health care provider. Your health care provider may prescribe or recommend: °· Vitamin B6 supplements. °· Anti-nausea medicines. °· The herbal medicine ginger. ° °Follow these instructions at home: °· Only take over-the-counter or prescription medicines as directed by your health care provider. °· Taking multivitamins before getting pregnant can prevent or decrease the severity of morning sickness in most women. °· Eat a piece of dry toast or unsalted crackers before getting out of bed in the morning. °· Eat five or six small meals a day. °· Eat dry and bland foods (rice, baked potato). Foods high in carbohydrates are often helpful. °· Do not drink liquids with your meals. Drink liquids between meals. °· Avoid greasy, fatty, and spicy foods. °· Get someone to cook for you if the smell of any food causes nausea and vomiting. °· If you feel nauseous after taking prenatal vitamins, take the vitamins at  night or with a snack. °· Snack on protein foods (nuts, yogurt, cheese) between meals if you are hungry. °· Eat unsweetened gelatins for desserts. °· Wearing an acupressure wristband (worn for sea sickness) may be helpful. °· Acupuncture may be helpful. °· Do not smoke. °· Get a humidifier to keep the air in your house free of odors. °· Get plenty of fresh air. °Contact a health care provider if: °· Your home remedies are not working, and you need medicine. °· You feel dizzy or lightheaded. °· You are losing weight. °Get help right away if: °· You have persistent and uncontrolled nausea and vomiting. °· You pass out (faint). °This information is not intended to replace advice given to you by your health care provider. Make sure you discuss any questions you have with your health care provider. °Document Released: 11/19/2006 Document Revised: 03/05/2016 Document Reviewed: 03/15/2013 °Elsevier Interactive Patient Education © 2017 [employer] ° °

## 2017-10-29 NOTE — MAU Provider Note (Signed)
History     CSN: [id number]  Arrival date and time: 10/29/17 1622   First Provider Initiated Contact with Patient 10/29/17 1708      Chief Complaint  Patient presents with  . Emesis   HPI Taylor Carson is a 27 y.o. G2P0010 at [redacted]w[redacted]d who presents with nausea and vomiting. She states she has thrown up "continuously" for the last 4 days and has nausea all day. She has not tried anything for the vomiting. She denies any pain or vaginal bleeding. She plans to get prenatal care at the [facility].  OB History    Gravida Para Term Preterm AB Living   2       1     SAB TAB Ectopic Multiple Live Births       1          Past Medical History:  Diagnosis Date  . Ectopic pregnancy 03/2017  . Hyperthyroidism     Past Surgical History:  Procedure Laterality Date  . NO PAST SURGERIES      History reviewed. No pertinent family history.  Social History   Tobacco Use  . Smoking status: Former Smoker    Packs/day: 0.25    Types: Cigarettes    Last attempt to quit: 09/11/2017    Years since quitting: 0.1  . Smokeless tobacco: Never Used  Substance Use Topics  . Alcohol use: No  . Drug use: Yes    Types: Marijuana    Comment: used last night 10/28/17    Allergies: No Known Allergies  Medications Prior to Admission  Medication Sig Dispense Refill Last Dose  . FLUoxetine (PROZAC) 20 MG tablet Take 1 tablet (20 mg total) by mouth daily. (Patient not taking: Reported on 10/02/2017) 30 tablet 0 Not Taking at Unknown time  . lamoTRIgine (LAMICTAL) 100 MG tablet Take 100 mg by mouth.   Not Taking at Unknown time  . Prenatal MV & Min w/FA-DHA (ALIVE PRENATAL) 0.4-25 MG CHEW Chew 1 tablet by mouth daily. 90 tablet 3     Review of Systems  Constitutional: Negative.  Negative for fatigue and fever.  HENT: Negative.   Respiratory: Negative.  Negative for shortness of breath.   Cardiovascular: Negative.  Negative for chest pain.  Gastrointestinal: Positive for nausea and  vomiting. Negative for abdominal pain, constipation and diarrhea.  Genitourinary: Negative.  Negative for dysuria.  Neurological: Negative.  Negative for dizziness and headaches.   Physical Exam   Blood pressure 118/69, pulse 60, temperature 98.2 F (36.8 C), temperature source Oral, resp. rate 16, weight 191 lb 4 oz (86.8 kg), last menstrual period 09/13/2017, SpO2 100 %, unknown if currently breastfeeding.  Physical Exam  Nursing note and vitals reviewed. Constitutional: She is oriented to person, place, and time. She appears well-developed and well-nourished. No distress.  HENT:  Head: Normocephalic.  Eyes: Pupils are equal, round, and reactive to light.  Cardiovascular: Normal rate, regular rhythm and normal heart sounds.  Respiratory: Effort normal and breath sounds normal. No respiratory distress.  GI: Soft. Bowel sounds are normal. She exhibits no distension. There is no tenderness.  Neurological: She is alert and oriented to person, place, and time.  Skin: Skin is warm and dry.  Psychiatric: She has a normal mood and affect. Her behavior is normal. Judgment and thought content normal.    MAU Course  Procedures Results for orders placed or performed during the hospital encounter of 10/29/17 (from the past 24 hour(s))  Urinalysis, Routine w reflex  microscopic     Status: Abnormal   Collection Time: 10/29/17  4:44 PM  Result Value Ref Range   Color, Urine YELLOW YELLOW   APPearance HAZY (A) CLEAR   Specific Gravity, Urine 1.032 (H) 1.005 - 1.030   pH 5.0 5.0 - 8.0   Glucose, UA NEGATIVE NEGATIVE mg/dL   Hgb urine dipstick NEGATIVE NEGATIVE   Bilirubin Urine NEGATIVE NEGATIVE   Ketones, ur 80 (A) NEGATIVE mg/dL   Protein, ur 30 (A) NEGATIVE mg/dL   Nitrite NEGATIVE NEGATIVE   Leukocytes, UA NEGATIVE NEGATIVE   RBC / HPF 0-5 0 - 5 RBC/hpf   WBC, UA 0-5 0 - 5 WBC/hpf   Bacteria, UA RARE (A) NONE SEEN   Squamous Epithelial / LPF 0-5 (A) NONE SEEN   Mucus PRESENT     MDM UA LR bolus Phenergan IV Multivitamin bolus No episodes of vomiting while in MAU  Assessment and Plan   1. Nausea and vomiting during pregnancy prior to [redacted] weeks gestation   2. [redacted] weeks gestation of pregnancy    -Discharge home in stable condition -Rx for phenergan sent to patient's pharmacy -[facility] -Patient may return to MAU as needed or if her condition were to change or worsen  Rolm BookbinderCaroline M Neill CNM 10/29/2017, 5:17 PM   Allergies as of 10/29/2017   No Known Allergies     Medication List    STOP taking these medications   FLUoxetine 20 MG tablet Commonly known as:  PROZAC     TAKE these medications   ALIVE PRENATAL 0.4-25 MG Chew Chew 1 tablet by mouth daily.   lamoTRIgine 100 MG tablet Commonly known as:  LAMICTAL Take 100 mg by mouth.   promethazine 25 MG tablet Commonly known as:  PHENERGAN Take 1 tablet (25 mg total) by mouth every 6 (six) hours as needed for nausea or vomiting.

## 2017-11-02 ENCOUNTER — Encounter: Payer: Self-pay | Admitting: Family Medicine

## 2017-11-02 ENCOUNTER — Ambulatory Visit (INDEPENDENT_AMBULATORY_CARE_PROVIDER_SITE_OTHER): Payer: Medicaid Other | Admitting: Family Medicine

## 2017-11-02 VITALS — BP 118/60 | HR 62 | Temp 98.5°F | Wt 197.2 lb

## 2017-11-02 DIAGNOSIS — Z3401 Encounter for supervision of normal first pregnancy, first trimester: Secondary | ICD-10-CM

## 2017-11-02 MED ORDER — ALIVE PRENATAL 0.4-25 MG PO CHEW: 1.0000 | CHEWABLE_TABLET | Freq: Every day | ORAL | 3 refills | Status: DC

## 2017-11-02 NOTE — Progress Notes (Signed)
Taylor Carson is a 27 y.o. yo G2P0010 at 6259w1d who presents for her initial prenatal visit. Pregnancy is planned with her boyfriend and she is excited about it.  Previously using Depo, has not been on contraception for about 3 years.  She reports breast tenderness, frequent urination and nausea. She  is not taking PNV.  Was unable to get the vitamins at her pharmacy as they were not covered by Medicaid.   See flow sheet for details.    PMH, POBH, FH, meds, allergies and Social Hx reviewed.    Prenatal Exam:  Gen: Well nourished, well developed.  No distress.  Vitals noted.   HEENT: Normocephalic, atraumatic.  Neck supple without cervical lymphadenopathy, thyromegaly or thyroid nodules.  Fair dentition.   CV: RRR no MRG  Lungs: CTAB.  Normal respiratory effort.   Abd: soft, NTND. +BS.  Uterus not appreciated above pelvis. GU: Normal external female genitalia without lesions.  Normal vaginal, well rugated without lesions. No vaginal discharge.  Bimanual exam: No adnexal mass or TTP. No CMT.   Ext: No clubbing, cyanosis or edema.  Psych: Normal grooming and dress.  Not depressed or anxious appearing.  Normal thought content and process without flight of ideas or looseness of associations.  Assessment & Plan: 1) 27 y.o. yo G2P0010 at 7859w1d via LMP doing well.   Current pregnancy issues include history of anxiety/depression (well controlled, not on medication), hyperthyroidism (controlled, not on med), h/o ectopic pregnancy, current THC use during pregnancy.  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  Counseled extensively on drug use and she expresses good understanding.   Dating is reliable by LMP.  Prenatal labs reviewed, within normal.  Genetic screening offered: she will discuss with boyfriend and they will decide by next visit.   Early glucola is indicated and was performed today.  Pt did not tolerate the solution due  to nausea and will schedule a lab visit to retry.   She has had her flu shot.  Sent alternate PNV to pharmacy which is covered by Medicaid and pt to pick them up today.  Encouraged her to start taking these today.   PHQ-9 and Pregnancy Medical Home forms completed and reviewed - no concerns identified.    Bleeding and pain precautions reviewed. Importance of prenatal vitamins reviewed.  Follow up in 4 weeks.   Freddrick MarchYashika Ciclaly Mulcahey, MD [facility], PGY-2

## 2017-11-02 NOTE — Patient Instructions (Addendum)
You were seen in clinic for your first OB prenatal appointment and are doing great. Your prenatal labs were all normal.  We discussed in detail your medical history, family history and aspects related to this pregnancy.  I have sent in prenatal vitamin gummies to your pharmacy to pickup.  Please make sure you start taking these soon as it is important for you and baby's health.   Additionally, we did a one-hour glucose tolerance test which screens for gestational diabetes (diabetes during pregnancy).  I will let you know the results of this.  As we discussed, if your sugars are high, you will need a follow up 3-hour glucose tolerance test.    I would like for you to follow up in 4 weeks for your next prenatal visit.  In the meantime, discuss with your partner if you desire genetic screening.   Congratulations again on your pregnancy!    Be well,  Freddrick March, MD    Commonly Asked Questions During Pregnancy  Cats: A parasite can be excreted in cat feces.  To avoid exposure you need to have another person empty the little box.  If you must empty the litter box you will need to wear gloves.  Wash your hands after handling your cat.  This parasite can also be found in raw or undercooked meat so this should also be avoided.  Colds, Sore Throats, Flu: Please check your medication sheet to see what you can take for symptoms.  If your symptoms are unrelieved by these medications please call the office.  Dental Work: Most any dental work Agricultural consultant recommends is permitted.  X-rays should only be taken during the first trimester if absolutely necessary.  Your abdomen should be shielded with a lead apron during all x-rays.  Please notify your provider prior to receiving any x-rays.  Novocaine is fine; gas is not recommended.  If your dentist requires a note from [place] prior to dental work please call the office and we will provide one for you.  Exercise: Exercise is an important part of staying healthy  during your pregnancy.  You may continue most exercises you were accustomed to prior to pregnancy.  Later in your pregnancy you will most likely notice you have difficulty with activities requiring balance like riding a bicycle.  It is important that you listen to your body and avoid activities that put you at a higher risk of falling.  Adequate rest and staying well hydrated are a must!  If you have questions about the safety of specific activities ask your provider.    Exposure to Children with illness: Try to avoid obvious exposure; report any symptoms to [place] when noted,  If you have chicken pos, red measles or mumps, you should be immune to these diseases.   Please do not take any vaccines while pregnant unless you have checked with your OB provider.  Fetal Movement: After 28 weeks we recommend you do "kick counts" twice daily.  Lie or sit down in a calm quiet environment and count your baby movements "kicks".  You should feel your baby at least 10 times per hour.  If you have not felt 10 kicks within the first hour get up, walk around and have something sweet to eat or drink then repeat for an additional hour.  If count remains less than 10 per hour notify your provider.  Fumigating: Follow your pest control agent's advice as to how long to stay out of your home.  Ventilate the  area well before re-entering.  Hemorrhoids:   Most over-the-counter preparations can be used during pregnancy.  Check your medication to see what is safe to use.  It is important to use a stool softener or fiber in your diet and to drink lots of liquids.  If hemorrhoids seem to be getting worse please call the office.   Hot Tubs:  Hot tubs Jacuzzis and saunas are not recommended while pregnant.  These increase your internal body temperature and should be avoided.  Intercourse:  Sexual intercourse is safe during pregnancy as long as you are comfortable, unless otherwise advised by your provider.  Spotting may occur after  intercourse; report any bright red bleeding that is heavier than spotting.  Labor:  If you know that you are in labor, please go to the hospital.  If you are unsure, please call the office and let us help you decide what to do.  Lifting, straining, etc:  If your job requires heavy lifting or straining please check with your provider for any limitations.  Generally, you should not lift items heavier than that you can lift simply with your hands and arms (no back muscles)  Painting:  Paint fumes do not harm your pregnancy, but may make you ill and should be avoided if possible.  Latex or water based paints have less odor than oils.  Use adequate ventilation while painting.  Permanents & Hair Color:  Chemicals in hair dyes are not recommended as they cause increase hair dryness which can increase hair loss during pregnancy.  " Highlighting" and permanents are allowed.  Dye may be absorbed differently and permanents may not hold as well during pregnancy.  Sunbathing:  Use a sunscreen, as skin burns easily during pregnancy.  Drink plenty of fluids; avoid over heating.  Tanning Beds:  Because their possible side effects are still unknown, tanning beds are not recommended.  Ultrasound Scans:  Routine ultrasounds are performed at approximately 20 weeks.  You will be able to see your baby's general anatomy an if you would like to know the gender this can usually be determined as well.  If it is questionable when you conceived you may also receive an ultrasound early in your pregnancy for dating purposes.  Otherwise ultrasound exams are not routinely performed unless there is a medical necessity.  Although you can request a scan we ask that you pay for it when conducted because insurance does not cover " patient request" scans.  Work: If your pregnancy proceeds without complications you may work until your due date, unless your physician or employer advises otherwise.  Round Ligament Pain/Pelvic Discomfort:   Sharp, shooting pains not associated with bleeding are fairly common, usually occurring in the second trimester of pregnancy.  They tend to be worse when standing up or when you remain standing for long periods of time.  These are the result of pressure of certain pelvic ligaments called "round ligaments".  Rest, Tylenol and heat seem to be the most effective relief.  As the womb and fetus grow, they rise out of the pelvis and the discomfort improves.  Please notify the office if your pain seems different than that described.  It may represent a more serious condition.

## 2017-11-05 ENCOUNTER — Other Ambulatory Visit: Payer: Medicaid Other

## 2017-11-11 ENCOUNTER — Other Ambulatory Visit (INDEPENDENT_AMBULATORY_CARE_PROVIDER_SITE_OTHER): Payer: Medicaid Other

## 2017-11-11 DIAGNOSIS — Z3401 Encounter for supervision of normal first pregnancy, first trimester: Secondary | ICD-10-CM | POA: Diagnosis present

## 2017-11-11 LAB — POCT 1 HR PRENATAL GLUCOSE: Glucose 1 Hr Prenatal, POC: 120 mg/dL

## 2017-12-02 ENCOUNTER — Ambulatory Visit (INDEPENDENT_AMBULATORY_CARE_PROVIDER_SITE_OTHER): Payer: Medicaid Other | Admitting: Internal Medicine

## 2017-12-02 VITALS — BP 115/80 | HR 59 | Temp 98.6°F | Wt 190.6 lb

## 2017-12-02 DIAGNOSIS — N898 Other specified noninflammatory disorders of vagina: Secondary | ICD-10-CM | POA: Diagnosis present

## 2017-12-02 LAB — POCT WET PREP (WET MOUNT)
Clue Cells Wet Prep Whiff POC: NEGATIVE
Trichomonas Wet Prep HPF POC: ABSENT

## 2017-12-02 NOTE — Progress Notes (Signed)
Taylor Carson is a 27 y.o. G2P0010 at 8363w3d here for routine follow up.  She reports vaginal discharge. Describes as thick and eggshell colored. Cannot say if there is an odor or not. See flow sheet for details.  A/P: Pregnancy at 1363w3d.  Doing well.   Pregnancy issues include history of anxiety/depression (well-controlled, not on medication), hyperthyroidism (controlled, not on med), h/o ectopic pregnancy, current THC use during pregnancy.  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  # Vaginal discharge Wet prep performed today. No discharge on pelvic exam. Wet prep neg. Likely physiologic discharge.   Anatomy ultrasound ordered to be scheduled at 18-19 weeks. Patient is interested in genetic screening. Bleeding and pain precautions reviewed. Follow up 4 weeks.  Tarri AbernethyAbigail J Mayling Aber, MD, MPH PGY-3 [facility] Pager [phone]

## 2017-12-02 NOTE — Patient Instructions (Addendum)
It was nice seeing you today Taylor Carson!  I will call you when the results of your test are back this afternoon.   [facility] to [facility] if you notice any of the following: - You feel a gush of fluid (as if your water broke) - You begin having painful contractions approximately every 3-5 minutes apart that make it difficult to breathe or walk - You are not feeling your baby move - You have vaginal bleeding  The address for [facility] is: [address]  [place], [place] [address] [phone]   I will see you back in 4 weeks for your next prenatal appointment.   If you have any questions or concerns in the meantime, please feel free to call the clinic.   Be well,  Dr. Natale MilchLancaster   Second Trimester of Pregnancy The second trimester is from week 13 through week 28, month 4 through 6. This is often the time in pregnancy that you feel your best. Often times, morning sickness has lessened or quit. You may have more energy, and you may get hungry more often. Your unborn baby (fetus) is growing rapidly. At the end of the sixth month, he or she is about 9 inches long and weighs about 1 pounds. You will likely feel the baby move (quickening) between 18 and 20 weeks of pregnancy. Follow these instructions at home:  Avoid all smoking, herbs, and alcohol. Avoid drugs not approved by your doctor.  Do not use any tobacco products, including cigarettes, chewing tobacco, and electronic cigarettes. If you need help quitting, ask your doctor. You may get counseling or other support to help you quit.  Only take medicine as told by your doctor. Some medicines are safe and some are not during pregnancy.  Exercise only as told by your doctor. Stop exercising if you start having cramps.  Eat regular, healthy meals.  Wear a good support bra if your breasts are tender.  Do not use hot tubs, steam rooms, or saunas.  Wear your seat belt when driving.  Avoid raw meat, uncooked cheese, and  liter boxes and soil used by cats.  Take your prenatal vitamins.  Take 1500-2000 milligrams of calcium daily starting at the 20th week of pregnancy until you deliver your baby.  Try taking medicine that helps you poop (stool softener) as needed, and if your doctor approves. Eat more fiber by eating fresh fruit, vegetables, and whole grains. Drink enough fluids to keep your pee (urine) clear or pale yellow.  Take warm water baths (sitz baths) to soothe pain or discomfort caused by hemorrhoids. Use hemorrhoid cream if your doctor approves.  If you have puffy, bulging veins (varicose veins), wear support hose. Raise (elevate) your feet for 15 minutes, 3-4 times a day. Limit salt in your diet.  Avoid heavy lifting, wear low heals, and sit up straight.  Rest with your legs raised if you have leg cramps or low back pain.  Visit your dentist if you have not gone during your pregnancy. Use a soft toothbrush to brush your teeth. Be gentle when you floss.  You can have sex (intercourse) unless your doctor tells you not to.  Go to your doctor visits. Get help if:  You feel dizzy.  You have mild cramps or pressure in your lower belly (abdomen).  You have a nagging pain in your belly area.  You continue to feel sick to your stomach (nauseous), throw up (vomit), or have watery poop (diarrhea).  You have  bad smelling fluid coming from your vagina.  You have pain with peeing (urination). Get help right away if:  You have a fever.  You are leaking fluid from your vagina.  You have spotting or bleeding from your vagina.  You have severe belly cramping or pain.  You lose or gain weight rapidly.  You have trouble catching your breath and have chest pain.  You notice sudden or extreme puffiness (swelling) of your face, hands, ankles, feet, or legs.  You have not felt the baby move in over an hour.  You have severe headaches that do not go away with medicine.  You have vision  changes. This information is not intended to replace advice given to you by your health care provider. Make sure you discuss any questions you have with your health care provider. Document Released: 12/23/2009 Document Revised: 03/05/2016 Document Reviewed: 11/29/2012 Elsevier Interactive Patient Education  2017 [employer].

## 2017-12-03 ENCOUNTER — Telehealth: Payer: Self-pay

## 2017-12-03 NOTE — Telephone Encounter (Signed)
Patient left message on nurse line that prenatal vitamin sent is not covered by Medicaid.  Of note, I looked on formulary and no vitamins are included.   Ples SpecterAlisa Sherrilynn Gudgel, RN [facility] RN)

## 2017-12-06 NOTE — Telephone Encounter (Signed)
Patient can buy any OTC PNV. Will call to let her know.   Tarri AbernethyAbigail J Jamina Macbeth, MD, MPH PGY-3 [facility] Pager [phone]

## 2017-12-07 NOTE — Telephone Encounter (Signed)
Called patient and left voice message that she can purchase any brand of over the counter vitamins per Dr. Natale MilchLancaster because insurance does not cover them. Stressed the importance of getting these vitamins and starting them right away.Glennie HawkSimpson, Marvelle Span R, CMA

## 2017-12-13 ENCOUNTER — Encounter (HOSPITAL_COMMUNITY): Payer: Self-pay | Admitting: *Deleted

## 2017-12-13 ENCOUNTER — Inpatient Hospital Stay (HOSPITAL_COMMUNITY)
Admission: AD | Admit: 2017-12-13 | Discharge: 2017-12-13 | Disposition: A | Discharge status: Home / Self Care | Payer: Medicaid Other | Source: Ambulatory Visit | Attending: Obstetrics and Gynecology | Admitting: Obstetrics and Gynecology

## 2017-12-13 ENCOUNTER — Other Ambulatory Visit: Payer: Self-pay

## 2017-12-13 DIAGNOSIS — Z87891 Personal history of nicotine dependence: Secondary | ICD-10-CM | POA: Diagnosis not present

## 2017-12-13 DIAGNOSIS — E860 Dehydration: Secondary | ICD-10-CM | POA: Diagnosis not present

## 2017-12-13 DIAGNOSIS — B9689 Other specified bacterial agents as the cause of diseases classified elsewhere: Secondary | ICD-10-CM | POA: Insufficient documentation

## 2017-12-13 DIAGNOSIS — O23591 Infection of other part of genital tract in pregnancy, first trimester: Secondary | ICD-10-CM | POA: Insufficient documentation

## 2017-12-13 DIAGNOSIS — O26891 Other specified pregnancy related conditions, first trimester: Secondary | ICD-10-CM | POA: Diagnosis not present

## 2017-12-13 DIAGNOSIS — N760 Acute vaginitis: Secondary | ICD-10-CM

## 2017-12-13 DIAGNOSIS — R109 Unspecified abdominal pain: Secondary | ICD-10-CM | POA: Diagnosis not present

## 2017-12-13 DIAGNOSIS — Z3A13 13 weeks gestation of pregnancy: Secondary | ICD-10-CM | POA: Insufficient documentation

## 2017-12-13 LAB — WET PREP, GENITAL
Sperm: NONE SEEN
TRICH WET PREP: NONE SEEN
Yeast Wet Prep HPF POC: NONE SEEN

## 2017-12-13 LAB — URINALYSIS, ROUTINE W REFLEX MICROSCOPIC
BILIRUBIN URINE: NEGATIVE
GLUCOSE, UA: NEGATIVE mg/dL
HGB URINE DIPSTICK: NEGATIVE
KETONES UR: NEGATIVE mg/dL
Leukocytes, UA: NEGATIVE
Nitrite: NEGATIVE
PROTEIN: NEGATIVE mg/dL
Specific Gravity, Urine: >1.03 — ABNORMAL HIGH (ref 1.005–1.030)
pH: 6 (ref 5.0–8.0)

## 2017-12-13 LAB — CBC
HCT: 37.3 % (ref 36.0–46.0)
Hemoglobin: 12.9 g/dL (ref 12.0–15.0)
MCH: 28.5 pg (ref 26.0–34.0)
MCHC: 34.6 g/dL (ref 30.0–36.0)
MCV: 82.5 fL (ref 78.0–100.0)
Platelets: 267 10*3/uL (ref 150–400)
RBC: 4.52 MIL/uL (ref 3.87–5.11)
RDW: 13.5 % (ref 11.5–15.5)
WBC: 8.9 10*3/uL (ref 4.0–10.5)

## 2017-12-13 MED ORDER — METRONIDAZOLE 500 MG PO TABS: 500.0000 mg | ORAL_TABLET | Freq: Two times a day (BID) | ORAL | 0 refills | Status: DC

## 2017-12-13 NOTE — MAU Provider Note (Addendum)
History     CSN: [id number]  Arrival date and time: 12/13/17 [id number]   First Provider Initiated Contact with Patient 12/13/17 1103      Chief Complaint  Patient presents with  . Abdominal Pain  . Back Pain   G2P0010 @13 .0 wks here with LAP. Pain started last night. Describes as sharp and intermittent. Pain is bilateral and central in lower abdomen. Rates pain 5/10. Did not take anything for it. Denies urinary sx except urgency. No VB or discharge. No fevers. Having some LL back pain, but not new symptom. Admits to poor water intake today, only had fruit punch.    OB History    Gravida Para Term Preterm AB Living   2       1     SAB TAB Ectopic Multiple Live Births       1          Past Medical History:  Diagnosis Date  . Anxiety   . Depression   . Ectopic pregnancy 03/2017  . Hyperthyroidism     History reviewed. No pertinent surgical history.  Family History  Problem Relation Age of Onset  . Miscarriages / [place] Mother   . Cancer Maternal Grandfather   . Diabetes Paternal Grandmother   . Heart disease Paternal Grandmother     Social History   Tobacco Use  . Smoking status: Former Smoker    Packs/day: 0.25    Types: Cigarettes    Last attempt to quit: 09/11/2017    Years since quitting: 0.2  . Smokeless tobacco: Never Used  Substance Use Topics  . Alcohol use: No  . Drug use: Yes    Frequency: 1.0 times per week    Types: Marijuana, Cocaine    Comment: Marijuana use 12/12/17    Allergies: No Known Allergies  Medications Prior to Admission  Medication Sig Dispense Refill Last Dose  . Prenatal MV & Min w/FA-DHA (ALIVE PRENATAL) 0.4-25 MG CHEW Chew 1 tablet by mouth daily. 90 tablet 3 12/13/2017 at Unknown time  . promethazine (PHENERGAN) 25 MG tablet Take 1 tablet (25 mg total) by mouth every 6 (six) hours as needed for nausea or vomiting. 30 tablet 2 Past Week at Unknown time    Review of Systems  Constitutional: Negative for fever.   Gastrointestinal: Positive for abdominal pain. Negative for constipation (hard stools).  Genitourinary: Positive for urgency. Negative for dysuria and hematuria.  Musculoskeletal: Positive for back pain (left lower).   Physical Exam   Blood pressure 124/80, pulse 79, temperature 98.5 F (36.9 C), temperature source Oral, resp. rate 16, weight 189 lb 12 oz (86.1 kg), last menstrual period 09/13/2017, SpO2 100 %, unknown if currently breastfeeding.  Physical Exam  Constitutional: She is oriented to person, place, and time. She appears well-developed and well-nourished. No distress.  HENT:  Head: Normocephalic and atraumatic.  Neck: Normal range of motion.  Respiratory: Effort normal. No respiratory distress.  GI: Soft. She exhibits no distension and no mass. There is tenderness in the suprapubic area. There is no rebound and no guarding.  Genitourinary:  Genitourinary Comments: External: no lesions or erythema Vagina: rugated, pink, moist, thin white frothy discharge, malodorous Cervix closed/long   Musculoskeletal: Normal range of motion.  Neurological: She is alert and oriented to person, place, and time.  Skin: Skin is warm and dry.  Psychiatric: She has a normal mood and affect.   Results for orders placed or performed during the hospital encounter of 12/13/17 (from the past  24 hour(s))  Urinalysis, Routine w reflex microscopic     Status: Abnormal   Collection Time: 12/13/17 10:06 AM  Result Value Ref Range   Color, Urine YELLOW YELLOW   APPearance CLEAR CLEAR   Specific Gravity, Urine >1.030 (H) 1.005 - 1.030   pH 6.0 5.0 - 8.0   Glucose, UA NEGATIVE NEGATIVE mg/dL   Hgb urine dipstick NEGATIVE NEGATIVE   Bilirubin Urine NEGATIVE NEGATIVE   Ketones, ur NEGATIVE NEGATIVE mg/dL   Protein, ur NEGATIVE NEGATIVE mg/dL   Nitrite NEGATIVE NEGATIVE   Leukocytes, UA NEGATIVE NEGATIVE  Wet prep, genital     Status: Abnormal   Collection Time: 12/13/17 11:02 AM  Result Value Ref  Range   Yeast Wet Prep HPF POC NONE SEEN NONE SEEN   Trich, Wet Prep NONE SEEN NONE SEEN   Clue Cells Wet Prep HPF POC PRESENT (A) NONE SEEN   WBC, Wet Prep HPF POC FEW (A) NONE SEEN   Sperm NONE SEEN   CBC     Status: None   Collection Time: 12/13/17 11:34 AM  Result Value Ref Range   WBC 8.9 4.0 - 10.5 K/uL   RBC 4.52 3.87 - 5.11 MIL/uL   Hemoglobin 12.9 12.0 - 15.0 g/dL   HCT 16.1 09.6 - 04.5 %   MCV 82.5 78.0 - 100.0 fL   MCH 28.5 26.0 - 34.0 pg   MCHC 34.6 30.0 - 36.0 g/dL   RDW 40.9 81.1 - 91.4 %   Platelets 267 150 - 400 K/uL   Limited bedside US: viable, active fetus, +cardiac activity, subj. nml AFV  MAU Course  Procedures  MDM Labs ordered and reviewed. No evidence of UTI or acute abdominal or pelvic process. Pain may be caused by constipation/hard stools. Recommend more water, fruits, and veggies. May use Coalce. Will treat BV. Stable for discharge home.   Assessment and Plan   1. [redacted] weeks gestation of pregnancy   2. Bacterial vaginosis   3. Dehydration    Discharge home Follow up with OB provider as scheduled SAB precautions Rx Flagyl  Allergies as of 12/13/2017   No Known Allergies     Medication List    TAKE these medications   ALIVE PRENATAL 0.4-25 MG Chew Chew 1 tablet by mouth daily.   metroNIDAZOLE 500 MG tablet Commonly known as:  FLAGYL Take 1 tablet (500 mg total) by mouth 2 (two) times daily.   promethazine 25 MG tablet Commonly known as:  PHENERGAN Take 1 tablet (25 mg total) by mouth every 6 (six) hours as needed for nausea or vomiting.      Donette Larry, CNM 12/13/2017, 12:09 PM

## 2017-12-13 NOTE — MAU Note (Addendum)
Pain in lower abd last night.  Worse than usual, lower than usual. Worrisome to her because of hx of ectopic preg.  This morning is just having low back ache, not a new problem.  Has been constipated, denies any urinary symptoms.

## 2017-12-13 NOTE — Discharge Instructions (Signed)
Bacterial Vaginosis Bacterial vaginosis is an infection of the vagina. It happens when too many germs (bacteria) grow in the vagina. This infection puts you at risk for infections from sex (STIs). Treating this infection can lower your risk for some STIs. You should also treat this if you are pregnant. It can cause your baby to be born early. Follow these instructions at home: Medicines  Take over-the-counter and prescription medicines only as told by your doctor.  Take or use your antibiotic medicine as told by your doctor. Do not stop taking or using it even if you start to feel better. General instructions  If you your sexual partner is a woman, tell her that you have this infection. She needs to get treatment if she has symptoms. If you have a female partner, he does not need to be treated.  During treatment: ? Avoid sex. ? Do not douche. ? Avoid alcohol as told. ? Avoid breastfeeding as told.  Drink enough fluid to keep your pee (urine) clear or pale yellow.  Keep your vagina and butt (rectum) clean. ? Wash the area with warm water every day. ? Wipe from front to back after you use the toilet.  Keep all follow-up visits as told by your doctor. This is important. Preventing this condition  Do not douche.  Use only warm water to wash around your vagina.  Use protection when you have sex. This includes: ? Latex condoms. ? Dental dams.  Limit how many people you have sex with. It is best to only have sex with the same person (be monogamous).  Get tested for STIs. Have your partner get tested.  Wear underwear that is cotton or lined with cotton.  Avoid tight pants and pantyhose. This is most important in summer.  Do not use any products that have nicotine or tobacco in them. These include cigarettes and e-cigarettes. If you need help quitting, ask your doctor.  Do not use illegal drugs.  Limit how much alcohol you drink. Contact a doctor if:  Your symptoms do not get  better, even after you are treated.  You have more discharge or pain when you pee (urinate).  You have a fever.  You have pain in your belly (abdomen).  You have pain with sex.  Your bleed from your vagina between periods. Summary  This infection happens when too many germs (bacteria) grow in the vagina.  Treating this condition can lower your risk for some infections from sex (STIs).  You should also treat this if you are pregnant. It can cause early (premature) birth.  Do not stop taking or using your antibiotic medicine even if you start to feel better. This information is not intended to replace advice given to you by your health care provider. Make sure you discuss any questions you have with your health care provider. Document Released: 07/07/2008 Document Revised: 06/13/2016 Document Reviewed: 06/13/2016 Elsevier Interactive Patient Education  2017 Elsevier Inc.   *Colace stool softener once or twice daily as needed

## 2017-12-14 LAB — GC/CHLAMYDIA PROBE AMP (~~LOC~~) NOT AT ARMC
Chlamydia: NEGATIVE
Neisseria Gonorrhea: NEGATIVE

## 2017-12-31 ENCOUNTER — Ambulatory Visit (INDEPENDENT_AMBULATORY_CARE_PROVIDER_SITE_OTHER): Payer: Medicaid Other | Admitting: Internal Medicine

## 2017-12-31 ENCOUNTER — Other Ambulatory Visit: Payer: Self-pay

## 2017-12-31 VITALS — BP 110/60 | HR 72 | Temp 98.0°F | Wt 192.0 lb

## 2017-12-31 DIAGNOSIS — Z8639 Personal history of other endocrine, nutritional and metabolic disease: Secondary | ICD-10-CM

## 2017-12-31 DIAGNOSIS — Z3A15 15 weeks gestation of pregnancy: Secondary | ICD-10-CM

## 2017-12-31 NOTE — Patient Instructions (Addendum)
It was nice seeing you today Taylor Carson!  I will call you if there are any abnormalities with your thyroid testing or with your genetic screening.   I will see you back in 4 weeks for your next prenatal appointment. Please be sure to come to your lab appointment for prenatal testing. We will discuss the results at your next visit. Please also be sure to go to your ultrasound appointment on April 15 at 8:45AM [facility].   Please go to [facility] if you notice any of the following: - You feel a gush of fluid (as if your water broke) - You begin having painful contractions approximately every 3-5 minutes apart that make it difficult to breathe or walk - You are not feeling your baby move - You have vaginal bleeding  The address for [facility] is: [address]  [place], [place] [id number] [phone]   If you have any questions or concerns in the meantime, please feel free to call the clinic.   Be well,  Dr. Natale MilchLancaster

## 2017-12-31 NOTE — Progress Notes (Deleted)
Taylor Carson is a 27 y.o. G2P0010 at 6790w4d here for routine follow up.  She reports {symptoms; pregnancy related:14538}. See flow sheet for details.  A/P: Pregnancy at 6790w4d.  Doing well.   Pregnancy issues include anxiety/depression (well-controlled, not on medication),hyperthyroidism (controlled, not on med),h/o ectopic pregnancy, currentTHC use during pregnancy. 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  Anatomy ultrasound ordered to be scheduled at 18-19 weeks. Patient {is/is not:9024} interested in genetic screening. Bleeding and pain precautions reviewed. Follow up 4 weeks.  Tarri AbernethyAbigail J Felix Pratt, MD, MPH PGY-3 [facility] Pager [phone]

## 2017-12-31 NOTE — Progress Notes (Signed)
See full note from today's date.

## 2017-12-31 NOTE — Progress Notes (Addendum)
Taylor Carson is a 27 y.o. G2P0010 at 6276w4d here for routine follow up.  Taylor Carson reports no complaints. Does mention possible heat intolerance and appetite. At recent visit to MAU for abd pain, rechecking thyroid panel was mentioned as consideration. Patient would like to have this done today. See flow sheet for details.  A/P: Pregnancy at 3876w4d.  Doing well.   Pregnancy issues include some nausea/vomiting and intermittent round ligament pain, history of anxiety/depression (well-controlled, not on medication),hyperthyroidism (controlled, not on med),h/o ectopic pregnancy, currentTHC use during pregnancy. 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  Anatomy ultrasound scheduled for 01/24/18. Patient is interested in genetic screening. Lab appt to be scheduled next week for quad screen.  TSH today to rule out recurrent hyperthyroidism (last normal in 07/2017).  Bleeding and pain precautions reviewed. Follow up 4 weeks.  Tarri AbernethyAbigail J [place], MD, MPH PGY-3 [facility] Pager [phone]

## 2018-01-01 LAB — TSH: TSH: 0.741 u[IU]/mL (ref 0.450–4.500)

## 2018-01-07 ENCOUNTER — Other Ambulatory Visit: Payer: Medicaid Other

## 2018-01-07 DIAGNOSIS — Z3490 Encounter for supervision of normal pregnancy, unspecified, unspecified trimester: Secondary | ICD-10-CM

## 2018-01-07 DIAGNOSIS — O009 Unspecified ectopic pregnancy without intrauterine pregnancy: Secondary | ICD-10-CM

## 2018-01-09 LAB — AFP TETRA
DIA MOM VALUE: 0.43
DIA Value (EIA): 64.54 pg/mL
DSR (By Age)    1 IN: 1012
DSR (SECOND TRIMESTER) 1 IN: 10000
Gestational Age: 16 WEEKS
MATERNAL AGE AT EDD: 25.4 a
MSAFP Mom: 1.06
MSAFP: 32.1 ng/mL
MSHCG MOM: 0.93
MSHCG: 31555 m[IU]/mL
Osb Risk: 10000
T18 (By Age): 1:3944 {titer}
TEST RESULTS AFP: NEGATIVE
WEIGHT: 190 [lb_av]
uE3 Mom: 1.39
uE3 Value: 1.04 ng/mL

## 2018-01-14 ENCOUNTER — Other Ambulatory Visit: Payer: Self-pay

## 2018-01-14 ENCOUNTER — Encounter (HOSPITAL_COMMUNITY): Payer: Self-pay | Admitting: *Deleted

## 2018-01-14 ENCOUNTER — Inpatient Hospital Stay (HOSPITAL_COMMUNITY): Payer: Medicaid Other

## 2018-01-14 ENCOUNTER — Inpatient Hospital Stay (HOSPITAL_COMMUNITY)
Admission: AD | Admit: 2018-01-14 | Discharge: 2018-01-14 | Disposition: A | Discharge status: Home / Self Care | Payer: Medicaid Other | Source: Ambulatory Visit | Attending: Obstetrics and Gynecology | Admitting: Obstetrics and Gynecology

## 2018-01-14 DIAGNOSIS — Z3A17 17 weeks gestation of pregnancy: Secondary | ICD-10-CM

## 2018-01-14 DIAGNOSIS — R109 Unspecified abdominal pain: Secondary | ICD-10-CM

## 2018-01-14 DIAGNOSIS — O26899 Other specified pregnancy related conditions, unspecified trimester: Secondary | ICD-10-CM

## 2018-01-14 DIAGNOSIS — R1030 Lower abdominal pain, unspecified: Secondary | ICD-10-CM | POA: Diagnosis present

## 2018-01-14 DIAGNOSIS — Z87891 Personal history of nicotine dependence: Secondary | ICD-10-CM | POA: Diagnosis not present

## 2018-01-14 DIAGNOSIS — R102 Pelvic and perineal pain: Secondary | ICD-10-CM

## 2018-01-14 DIAGNOSIS — O26892 Other specified pregnancy related conditions, second trimester: Secondary | ICD-10-CM

## 2018-01-14 LAB — URINALYSIS, ROUTINE W REFLEX MICROSCOPIC
BILIRUBIN URINE: NEGATIVE
Glucose, UA: NEGATIVE mg/dL
Hgb urine dipstick: NEGATIVE
Ketones, ur: NEGATIVE mg/dL
LEUKOCYTES UA: NEGATIVE
NITRITE: NEGATIVE
Protein, ur: NEGATIVE mg/dL
SPECIFIC GRAVITY, URINE: 1.015 (ref 1.005–1.030)
pH: 8 (ref 5.0–8.0)

## 2018-01-14 LAB — CBC WITH DIFFERENTIAL/PLATELET
BASOS PCT: 0 %
Basophils Absolute: 0 10*3/uL (ref 0.0–0.1)
EOS ABS: 0.1 10*3/uL (ref 0.0–0.7)
Eosinophils Relative: 1 %
HEMATOCRIT: 33.8 % — AB (ref 36.0–46.0)
HEMOGLOBIN: 11.6 g/dL — AB (ref 12.0–15.0)
LYMPHS ABS: 1.7 10*3/uL (ref 0.7–4.0)
Lymphocytes Relative: 19 %
MCH: 28.6 pg (ref 26.0–34.0)
MCHC: 34.3 g/dL (ref 30.0–36.0)
MCV: 83.3 fL (ref 78.0–100.0)
Monocytes Absolute: 0.3 10*3/uL (ref 0.1–1.0)
Monocytes Relative: 3 %
NEUTROS ABS: 7.2 10*3/uL (ref 1.7–7.7)
NEUTROS PCT: 77 %
Platelets: 255 10*3/uL (ref 150–400)
RBC: 4.06 MIL/uL (ref 3.87–5.11)
RDW: 13.8 % (ref 11.5–15.5)
WBC: 9.3 10*3/uL (ref 4.0–10.5)

## 2018-01-14 MED ORDER — IBUPROFEN 600 MG PO TABS
600.0000 mg | ORAL_TABLET | Freq: Once | ORAL | Status: AC
  Administered 2018-01-14: 600 mg via ORAL
  Filled 2018-01-14: qty 1

## 2018-01-14 NOTE — MAU Note (Signed)
Sharp pain in lower abd, started when walking this morning- made her hunch over. No bleeding.  No GI or GU complaints

## 2018-01-14 NOTE — Discharge Instructions (Signed)
Abdominal Pain During Pregnancy Abdominal pain is common in pregnancy. Most of the time, it does not cause harm. There are many causes of abdominal pain. Some causes are more serious than others and sometimes the cause is not known. Abdominal pain can be a sign that something is very wrong with the pregnancy or the pain may have nothing to do with the pregnancy. Always tell your health care provider if you have any abdominal pain. Follow these instructions at home:  Do not have sex or put anything in your vagina until your symptoms go away completely.  Watch your abdominal pain for any changes.  Get plenty of rest until your pain improves.  Drink enough fluid to keep your urine clear or pale yellow.  Take over-the-counter or prescription medicines only as told by your health care provider.  Keep all follow-up visits as told by your health care provider. This is important. Contact a health care provider if:  You have a fever.  Your pain gets worse or you have cramping.  Your pain continues after resting. Get help right away if:  You are bleeding, leaking fluid, or passing tissue from the vagina.  You have vomiting or diarrhea that does not go away.  You have painful or bloody urination.  You notice a decrease in your baby's movements.  You feel very weak or faint.  You have shortness of breath.  You develop a severe headache with abdominal pain.  You have abnormal vaginal discharge with abdominal pain. This information is not intended to replace advice given to you by your health care provider. Make sure you discuss any questions you have with your health care provider. Document Released: 09/28/2005 Document Revised: 07/09/2016 Document Reviewed: 04/27/2013 Elsevier Interactive Patient Education  2018 Elsevier Inc.  Round Ligament Pain During Pregnancy   Round ligament pain is a sharp pain or jabbing feeling often felt in the lower belly or groin area on one or both  sides. It is one of the most common complaints during pregnancy and is considered a normal part of pregnancy. It is most often felt during the second trimester.   Here is what you need to know about round ligament pain, including some tips to help you feel better.   Causes of Round Ligament Pain:    Several thick ligaments surround and support your womb (uterus) as it grows during pregnancy. One of them is called the round ligament.   The round ligament connects the front part of the womb to your groin, the area where your legs attach to your pelvis. The round ligament normally tightens and relaxes slowly.   As your baby and womb grow, the round ligament stretches. That makes it more likely to become strained.   Sudden movements can cause the ligament to tighten quickly, like a rubber band snapping. This causes a sudden and quick jabbing feeling.   Symptoms of Round Ligament Pain   Round ligament pain can be concerning and uncomfortable. But it is considered normal as your body changes during pregnancy.   The symptoms of round ligament pain include a sharp, sudden spasm in the belly. It usually affects the right side, but it may happen on both sides. The pain only lasts a few seconds.   Exercise may cause the pain, as will rapid movements such as:   sneezing  coughing  laughing  rolling over in bed  standing up too quickly   Treatment of Round Ligament Pain   Here are some tips  may help reduce your discomfort:  ° °Pain relief. Take over-the-counter acetaminophen for pain, if necessary. Ask your doctor if this is OK.  ° °Exercise. Get plenty of exercise to keep your stomach (core) muscles strong. Doing stretching exercises or prenatal yoga can be helpful. Ask your doctor which exercises are safe for you and your baby.  ° °A helpful exercise involves putting your hands and knees on the floor, lowering your head, and pushing your backside into the air.  ° °Avoid sudden movements. Change  positions slowly (such as standing up or sitting down) to avoid sudden movements that may cause stretching and pain.  ° °Flex your hips. Bend and flex your hips before you cough, sneeze, or laugh to avoid pulling on the ligaments.  ° °Apply warmth. A heating pad or warm bath may be helpful. Ask your doctor if this is OK. Extreme heat can be dangerous to the baby.  ° °You should try to modify your daily activity level and avoid positions that may worsen the condition.  ° °When to Call the Doctor/Midwife  ° °Always tell your doctor or midwife about any type of pain you have during pregnancy. Round ligament pain is quick and doesn't last long.  ° °Call your health care provider immediately if you have:  °severe pain  °fever  °chills  °pain on urination  °difficulty walking  ° °Belly pain during pregnancy can be due to many different causes. It is important for your doctor to rule out more serious conditions, including pregnancy complications such as placenta abruption or non-pregnancy illnesses such as:  °inguinal hernia  °appendicitis  °stomach, liver, and kidney problems  °Preterm labor pains may sometimes be mistaken for round ligament pain.   ° °

## 2018-01-14 NOTE — MAU Provider Note (Signed)
History     CSN: [id number]  Arrival date and time: 01/14/18 4098   First Provider Initiated Contact with Patient 01/14/18 [phone]      Chief Complaint  Patient presents with  . Abdominal Pain   HPI   Taylor Carson is a 27 y.o. female G2P0010 @ [redacted]w[redacted]d here in MAU with lower abdominal pain. Says she first felt the pain at 0630 this morning after using the bathroom. No constipation. No bleeding. Says on the way here she was feeling the pain constantly, currently the pain comes and goes. Says she is feeling the pain every 20 mins. Says she has not taken anything for the pain. At times movement makes the pain worse. No bleeding.   OB History    Gravida  2   Para      Term      Preterm      AB  1   Living        SAB      TAB      Ectopic  1   Multiple      Live Births              Past Medical History:  Diagnosis Date  . Anxiety   . Depression   . Ectopic pregnancy 03/2017  . Hyperthyroidism     Past Surgical History:  Procedure Laterality Date  . NO PAST SURGERIES      Family History  Problem Relation Age of Onset  . Miscarriages / [place] Mother   . Cancer Maternal Grandfather   . Diabetes Paternal Grandmother   . Heart disease Paternal Grandmother     Social History   Tobacco Use  . Smoking status: Former Smoker    Packs/day: 0.25    Types: Cigarettes    Last attempt to quit: 09/11/2017    Years since quitting: 0.3  . Smokeless tobacco: Never Used  Substance Use Topics  . Alcohol use: No  . Drug use: Yes    Frequency: 1.0 times per week    Types: Marijuana, Cocaine    Comment: Marijuana use 12/12/17    Allergies: No Known Allergies  Medications Prior to Admission  Medication Sig Dispense Refill Last Dose  . metroNIDAZOLE (FLAGYL) 500 MG tablet Take 1 tablet (500 mg total) by mouth 2 (two) times daily. 14 tablet 0   . Prenatal MV & Min w/FA-DHA (ALIVE PRENATAL) 0.4-25 MG CHEW Chew 1 tablet by mouth daily. 90 tablet 3 12/13/2017  at Unknown time  . promethazine (PHENERGAN) 25 MG tablet Take 1 tablet (25 mg total) by mouth every 6 (six) hours as needed for nausea or vomiting. 30 tablet 2 Past Week at Unknown time   Results for orders placed or performed during the hospital encounter of 01/14/18 (from the past 48 hour(s))  Urinalysis, Routine w reflex microscopic     Status: Abnormal   Collection Time: 01/14/18  8:05 AM  Result Value Ref Range   Color, Urine YELLOW YELLOW   APPearance CLOUDY (A) CLEAR   Specific Gravity, Urine 1.015 1.005 - 1.030   pH 8.0 5.0 - 8.0   Glucose, UA NEGATIVE NEGATIVE mg/dL   Hgb urine dipstick NEGATIVE NEGATIVE   Bilirubin Urine NEGATIVE NEGATIVE   Ketones, ur NEGATIVE NEGATIVE mg/dL   Protein, ur NEGATIVE NEGATIVE mg/dL   Nitrite NEGATIVE NEGATIVE   Leukocytes, UA NEGATIVE NEGATIVE    Comment: Performed at [facility], [address], [place], [place] [address]  CBC with Differential  Status: Abnormal   Collection Time: 01/14/18  9:24 AM  Result Value Ref Range   WBC 9.3 4.0 - 10.5 K/uL   RBC 4.06 3.87 - 5.11 MIL/uL   Hemoglobin 11.6 (L) 12.0 - 15.0 g/dL   HCT 81.133.8 (L) 91.436.0 - 78.246.0 %   MCV 83.3 78.0 - 100.0 fL   MCH 28.6 26.0 - 34.0 pg   MCHC 34.3 30.0 - 36.0 g/dL   RDW 95.613.8 21.311.5 - 08.615.5 %   Platelets 255 150 - 400 K/uL   Neutrophils Relative % 77 %   Neutro Abs 7.2 1.7 - 7.7 K/uL   Lymphocytes Relative 19 %   Lymphs Abs 1.7 0.7 - 4.0 K/uL   Monocytes Relative 3 %   Monocytes Absolute 0.3 0.1 - 1.0 K/uL   Eosinophils Relative 1 %   Eosinophils Absolute 0.1 0.0 - 0.7 K/uL   Basophils Relative 0 %   Basophils Absolute 0.0 0.0 - 0.1 K/uL    Comment: Performed at [facility], [address] [place], [place] [address]   [place] [facility] Ob Limited  Result Date: 01/14/2018 ----------------------------------------------------------------------  OBSTETRICS REPORT                      (Signed Final 01/14/2018 11:04 am)  ---------------------------------------------------------------------- Patient Info  ID #:       [id number]                          D.O.B.:  [birth year] (27 yrs)  Name:       Taylor Carson               Visit Date: 01/14/2018 10:18 am ---------------------------------------------------------------------- Performed By  Performed By:     Percell BostonHeather Waken          Referred By:      MAU Nursing-                    RDMS                                     MAU/Triage  Attending:        Charlsie MerlesMark Newman MD         [facility]         [facility] ---------------------------------------------------------------------- Orders   #  Description                                 Code   1  US MFM OB LIMITED                           [id number]  ----------------------------------------------------------------------   #  Ordered By               Order #        Accession #    Episode #   1  Venia CarbonJENNIFER RASCH           [id number]      [id number]     [id number]  ---------------------------------------------------------------------- Indications   [redacted] weeks gestation of pregnancy                Z3A.17   Abdominal pain in pregnancy  O99.89  ---------------------------------------------------------------------- OB History  Gravidity:    2         Term:   0        Prem:   0        SAB:   0  TOP:          0       Ectopic:  1        Living: 0 ---------------------------------------------------------------------- Fetal Evaluation  Num Of Fetuses:     1  Fetal Heart         155  Rate(bpm):  Cardiac Activity:   Observed  Presentation:       Cephalic  Placenta:           Anterior, above cervical os  P. Cord Insertion:  Visualized, central  Amniotic Fluid  AFI FV:      Subjectively within normal limits                              Largest Pocket(cm)                              4.5 ---------------------------------------------------------------------- Gestational Age  LMP:           17w 4d        Date:  09/13/17                 EDD:   06/20/18   Best:          Denyse Dago 4d     Det. By:  LMP  (09/13/17)          EDD:   06/20/18 ---------------------------------------------------------------------- Cervix Uterus Adnexa  Cervix  Length:            3.3  cm.  Normal appearance by transabdominal scan.  Uterus  No abnormality visualized.  Left Ovary  No adnexal mass visualized.  Right Ovary  No adnexal mass visualized.  Cul De Sac:   No free fluid seen.  Adnexa:       No abnormality visualized. ---------------------------------------------------------------------- Impression  Singleton intrauterine pregnancy at 17+4 weeks with  abdominal pain here for requested limited exam  Normal fetal movement and cardiac activity  Cephalic presentation  Anterior placenta without markers for abruption  Amniotic fluid volume is normal  Adnexa are grossly normal ---------------------------------------------------------------------- Recommendations  Continue clinical evaluation and management. ----------------------------------------------------------------------                 Charlsie Merles, MD Electronically Signed Final Report   01/14/2018 11:04 am ----------------------------------------------------------------------   Review of Systems  Constitutional: Negative for fever.  Gastrointestinal: Positive for nausea. Negative for vomiting.  Genitourinary: Negative for dysuria.  Musculoskeletal: Positive for back pain.   Physical Exam   Blood pressure 109/64, pulse 66, temperature 98.7 F (37.1 C), temperature source Oral, resp. rate 16, weight 191 lb (86.6 kg), last menstrual period 09/13/2017, SpO2 100 %, unknown if currently breastfeeding.  Physical Exam  Constitutional: She is oriented to person, place, and time. She appears well-developed and well-nourished. No distress.  HENT:  Head: Normocephalic.  Eyes: Pupils are equal, round, and reactive to light.  GI: Soft. She exhibits no distension and no mass. There is no tenderness. There is no rebound and no guarding.   Musculoskeletal: Normal range of motion.  Neurological: She is alert and oriented to person, place, and time.  Skin: Skin is  warm. She is not diaphoretic.  Psychiatric: Her behavior is normal.    MAU Course  Procedures  None  MDM  + heart tones via doppler  Normal cervical length at 3.3 cm  Ibuprofen given 600 mg; Pain down from 7/10 to 4/10  Assessment and Plan   A:  1. Abdominal pain in pregnancy, second trimester   2. [redacted] weeks gestation of pregnancy   3. Pain of round ligament affecting pregnancy, antepartum     P:  Discharge home in stable condition Pain has improved  Return to MAU if symptoms worsen  Pregnancy support belt Keep your next appointment in the office.    Duane Lope, NP 01/14/2018 5:38 PM

## 2018-01-24 ENCOUNTER — Ambulatory Visit (HOSPITAL_COMMUNITY): Payer: Medicaid Other

## 2018-02-01 ENCOUNTER — Ambulatory Visit (INDEPENDENT_AMBULATORY_CARE_PROVIDER_SITE_OTHER): Payer: Medicaid Other | Admitting: Family Medicine

## 2018-02-01 ENCOUNTER — Encounter: Payer: Self-pay | Admitting: Family Medicine

## 2018-02-01 ENCOUNTER — Other Ambulatory Visit: Payer: Self-pay

## 2018-02-01 VITALS — BP 120/80 | HR 93 | Temp 97.6°F | Wt 202.0 lb

## 2018-02-01 DIAGNOSIS — Z3492 Encounter for supervision of normal pregnancy, unspecified, second trimester: Secondary | ICD-10-CM

## 2018-02-01 NOTE — Patient Instructions (Signed)
Second Trimester of Pregnancy The second trimester is from week 13 through week 28, month 4 through 6. This is often the time in pregnancy that you feel your best. Often times, morning sickness has lessened or quit. You may have more energy, and you may get hungry more often. Your unborn baby (fetus) is growing rapidly. At the end of the sixth month, he or she is about 9 inches long and weighs about 1 pounds. You will likely feel the baby move (quickening) between 18 and 20 weeks of pregnancy. Follow these instructions at home:  Avoid all smoking, herbs, and alcohol. Avoid drugs not approved by your doctor.  Do not use any tobacco products, including cigarettes, chewing tobacco, and electronic cigarettes. If you need help quitting, ask your doctor. You may get counseling or other support to help you quit.  Only take medicine as told by your doctor. Some medicines are safe and some are not during pregnancy.  Exercise only as told by your doctor. Stop exercising if you start having cramps.  Eat regular, healthy meals.  Wear a good support bra if your breasts are tender.  Do not use hot tubs, steam rooms, or saunas.  Wear your seat belt when driving.  Avoid raw meat, uncooked cheese, and liter boxes and soil used by cats.  Take your prenatal vitamins.  Take 1500-2000 milligrams of calcium daily starting at the 20th week of pregnancy until you deliver your baby.  Try taking medicine that helps you poop (stool softener) as needed, and if your doctor approves. Eat more fiber by eating fresh fruit, vegetables, and whole grains. Drink enough fluids to keep your pee (urine) clear or pale yellow.  Take warm water baths (sitz baths) to soothe pain or discomfort caused by hemorrhoids. Use hemorrhoid cream if your doctor approves.  If you have puffy, bulging veins (varicose veins), wear support hose. Raise (elevate) your feet for 15 minutes, 3-4 times a day. Limit salt in your diet.  Avoid heavy  lifting, wear low heals, and sit up straight.  Rest with your legs raised if you have leg cramps or low back pain.  Visit your dentist if you have not gone during your pregnancy. Use a soft toothbrush to brush your teeth. Be gentle when you floss.  You can have sex (intercourse) unless your doctor tells you not to.  Go to your doctor visits. Get help if:  You feel dizzy.  You have mild cramps or pressure in your lower belly (abdomen).  You have a nagging pain in your belly area.  You continue to feel sick to your stomach (nauseous), throw up (vomit), or have watery poop (diarrhea).  You have bad smelling fluid coming from your vagina.  You have pain with peeing (urination). Get help right away if:  You have a fever.  You are leaking fluid from your vagina.  You have spotting or bleeding from your vagina.  You have severe belly cramping or pain.  You lose or gain weight rapidly.  You have trouble catching your breath and have chest pain.  You notice sudden or extreme puffiness (swelling) of your face, hands, ankles, feet, or legs.  You have not felt the baby move in over an hour.  You have severe headaches that do not go away with medicine.  You have vision changes. This information is not intended to replace advice given to you by your health care provider. Make sure you discuss any questions you have with your health care   provider. Document Released: 12/23/2009 Document Revised: 03/05/2016 Document Reviewed: 11/29/2012 Elsevier Interactive Patient Education  2017 [employer]  

## 2018-02-01 NOTE — Progress Notes (Signed)
Taylor Carson is a 27 y.o. G2P0010 at 3848w1d here for routine follow up.  She reports no vaginal bleeding/discharge, fluid leakage or abdominal pain. See flow sheet for details.  A/P: Pregnancy at 9548w1d.  Doing well.   Pregnancy issues include: Abdominal on 4/5, seen in the MAU. resolved.  Anatomy scan rescheduled for the 4/29, patient missed initial appointment. Limited OB US on 4/5 showed intrauterine pregnancy with normal fetal movement and cardiac activity.  Preterm labor precautions reviewed. Follow up 4 weeks.

## 2018-02-07 ENCOUNTER — Ambulatory Visit (HOSPITAL_COMMUNITY)
Admission: RE | Admit: 2018-02-07 | Discharge: 2018-02-07 | Disposition: A | Discharge status: Home / Self Care | Payer: Medicaid Other | Source: Ambulatory Visit | Attending: Family Medicine | Admitting: Family Medicine

## 2018-02-07 ENCOUNTER — Other Ambulatory Visit: Payer: Self-pay | Admitting: Internal Medicine

## 2018-02-07 DIAGNOSIS — O99212 Obesity complicating pregnancy, second trimester: Secondary | ICD-10-CM | POA: Diagnosis not present

## 2018-02-07 DIAGNOSIS — Z363 Encounter for antenatal screening for malformations: Secondary | ICD-10-CM | POA: Insufficient documentation

## 2018-02-07 DIAGNOSIS — Z3A15 15 weeks gestation of pregnancy: Secondary | ICD-10-CM

## 2018-02-07 DIAGNOSIS — O99332 Smoking (tobacco) complicating pregnancy, second trimester: Secondary | ICD-10-CM | POA: Insufficient documentation

## 2018-02-07 DIAGNOSIS — Z3A21 21 weeks gestation of pregnancy: Secondary | ICD-10-CM | POA: Diagnosis not present

## 2018-02-11 ENCOUNTER — Encounter: Payer: Self-pay | Admitting: Internal Medicine

## 2018-02-14 NOTE — Telephone Encounter (Signed)
Called patient regarding decreased fetal movement. Patient says baby is moving normally now. No questions. Voiced appreciation for calling.   Tarri Abernethy, MD, MPH PGY-3 [facility] Pager [phone]

## 2018-03-01 ENCOUNTER — Other Ambulatory Visit: Payer: Self-pay

## 2018-03-01 ENCOUNTER — Ambulatory Visit (INDEPENDENT_AMBULATORY_CARE_PROVIDER_SITE_OTHER): Payer: Medicaid Other | Admitting: Family Medicine

## 2018-03-01 VITALS — BP 118/60 | HR 88 | Temp 97.6°F | Wt 195.0 lb

## 2018-03-01 DIAGNOSIS — Z3402 Encounter for supervision of normal first pregnancy, second trimester: Secondary | ICD-10-CM

## 2018-03-01 DIAGNOSIS — Z3400 Encounter for supervision of normal first pregnancy, unspecified trimester: Secondary | ICD-10-CM | POA: Insufficient documentation

## 2018-03-01 LAB — POCT 1 HR PRENATAL GLUCOSE: Glucose 1 Hr Prenatal, POC: 140 mg/dL

## 2018-03-01 NOTE — Patient Instructions (Signed)
Look around on [email or web address] and also schedule childbirth and breastfeeding (if available) classes with [facility]. Come back in 1 month for another pregnancy checkup.    Breastfeeding Choosing to breastfeed is one of the best decisions you can make for yourself and your baby. A change in hormones during pregnancy causes your breasts to make breast milk in your milk-producing glands. Hormones prevent breast milk from being released before your baby is born. They also prompt milk flow after birth. Once breastfeeding has begun, thoughts of your baby, as well as his or her sucking or crying, can stimulate the release of milk from your milk-producing glands. Benefits of breastfeeding Research shows that breastfeeding offers many health benefits for infants and mothers. It also offers a cost-free and convenient way to feed your baby. For your baby  Your first milk (colostrum) helps your baby's digestive system to function better.  Special cells in your milk (antibodies) help your baby to fight off infections.  Breastfed babies are less likely to develop asthma, allergies, obesity, or type 2 diabetes. They are also at lower risk for sudden infant death syndrome (SIDS).  Nutrients in breast milk are better able to meet your baby's needs compared to infant formula.  Breast milk improves your baby's brain development. For you  Breastfeeding helps to create a very special bond between you and your baby.  Breastfeeding is convenient. Breast milk costs nothing and is always available at the correct temperature.  Breastfeeding helps to burn calories. It helps you to lose the weight that you gained during pregnancy.  Breastfeeding makes your uterus return faster to its size before pregnancy. It also slows bleeding (lochia) after you give birth.  Breastfeeding helps to lower your risk of developing type 2 diabetes, osteoporosis, rheumatoid arthritis, cardiovascular disease, and breast, ovarian, uterine, and  endometrial cancer later in life. Breastfeeding basics Starting breastfeeding  Find a comfortable place to sit or lie down, with your neck and back well-supported.  Place a pillow or a rolled-up blanket under your baby to bring him or her to the level of your breast (if you are seated). Nursing pillows are specially designed to help support your arms and your baby while you breastfeed.  Make sure that your baby's tummy (abdomen) is facing your abdomen.  Gently massage your breast. With your fingertips, massage from the outer edges of your breast inward toward the nipple. This encourages milk flow. If your milk flows slowly, you may need to continue this action during the feeding.  Support your breast with 4 fingers underneath and your thumb above your nipple (make the letter "C" with your hand). Make sure your fingers are well away from your nipple and your baby's mouth.  Stroke your baby's lips gently with your finger or nipple.  When your baby's mouth is open wide enough, quickly bring your baby to your breast, placing your entire nipple and as much of the areola as possible into your baby's mouth. The areola is the colored area around your nipple. ? More areola should be visible above your baby's upper lip than below the lower lip. ? Your baby's lips should be opened and extended outward (flanged) to ensure an adequate, comfortable latch. ? Your baby's tongue should be between his or her lower gum and your breast.  Make sure that your baby's mouth is correctly positioned around your nipple (latched). Your baby's lips should create a seal on your breast and be turned out (everted).  It is common for your baby  to suck about 2-3 minutes in order to start the flow of breast milk. Latching Teaching your baby how to latch onto your breast properly is very important. An improper latch can cause nipple pain, decreased milk supply, and poor weight gain in your baby. Also, if your baby is not  latched onto your nipple properly, he or she may swallow some air during feeding. This can make your baby fussy. Burping your baby when you switch breasts during the feeding can help to get rid of the air. However, teaching your baby to latch on properly is still the best way to prevent fussiness from swallowing air while breastfeeding. Signs that your baby has successfully latched onto your nipple  Silent tugging or silent sucking, without causing you pain. Infant's lips should be extended outward (flanged).  Swallowing heard between every 3-4 sucks once your milk has started to flow (after your let-down milk reflex occurs).  Muscle movement above and in front of his or her ears while sucking.  Signs that your baby has not successfully latched onto your nipple  Sucking sounds or smacking sounds from your baby while breastfeeding.  Nipple pain.  If you think your baby has not latched on correctly, slip your finger into the corner of your baby's mouth to break the suction and place it between your baby's gums. Attempt to start breastfeeding again. Signs of successful breastfeeding Signs from your baby  Your baby will gradually decrease the number of sucks or will completely stop sucking.  Your baby will fall asleep.  Your baby's body will relax.  Your baby will retain a small amount of milk in his or her mouth.  Your baby will let go of your breast by himself or herself.  Signs from you  Breasts that have increased in firmness, weight, and size 1-3 hours after feeding.  Breasts that are softer immediately after breastfeeding.  Increased milk volume, as well as a change in milk consistency and color by the fifth day of breastfeeding.  Nipples that are not sore, cracked, or bleeding.  Signs that your baby is getting enough milk  Wetting at least 1-2 diapers during the first 24 hours after birth.  Wetting at least 5-6 diapers every 24 hours for the first week after birth. The  urine should be clear or pale yellow by the age of 5 days.  Wetting 6-8 diapers every 24 hours as your baby continues to grow and develop.  At least 3 stools in a 24-hour period by the age of 5 days. The stool should be soft and yellow.  At least 3 stools in a 24-hour period by the age of 7 days. The stool should be seedy and yellow.  No loss of weight greater than 10% of birth weight during the first 3 days of life.  Average weight gain of 4-7 oz (113-198 g) per week after the age of 4 days.  Consistent daily weight gain by the age of 5 days, without weight loss after the age of 2 weeks. After a feeding, your baby may spit up a small amount of milk. This is normal. Breastfeeding frequency and duration Frequent feeding will help you make more milk and can prevent sore nipples and extremely full breasts (breast engorgement). Breastfeed when you feel the need to reduce the fullness of your breasts or when your baby shows signs of hunger. This is called "breastfeeding on demand." Signs that your baby is hungry include:  Increased alertness, activity, or restlessness.  Movement  of the head from side to side.  Opening of the mouth when the corner of the mouth or cheek is stroked (rooting).  Increased sucking sounds, smacking lips, cooing, sighing, or squeaking.  Hand-to-mouth movements and sucking on fingers or hands.  Fussing or crying.  Avoid introducing a pacifier to your baby in the first 4-6 weeks after your baby is born. After this time, you may choose to use a pacifier. Research has shown that pacifier use during the first year of a baby's life decreases the risk of sudden infant death syndrome (SIDS). Allow your baby to feed on each breast as long as he or she wants. When your baby unlatches or falls asleep while feeding from the first breast, offer the second breast. Because newborns are often sleepy in the first few weeks of life, you may need to awaken your baby to get him or her  to feed. Breastfeeding times will vary from baby to baby. However, the following rules can serve as a guide to help you make sure that your baby is properly fed:  Newborns (babies 76 weeks of age or younger) may breastfeed every 1-3 hours.  Newborns should not go without breastfeeding for longer than 3 hours during the day or 5 hours during the night.  You should breastfeed your baby a minimum of 8 times in a 24-hour period.  Breast milk pumping Pumping and storing breast milk allows you to make sure that your baby is exclusively fed your breast milk, even at times when you are unable to breastfeed. This is especially important if you go back to work while you are still breastfeeding, or if you are not able to be present during feedings. Your lactation consultant can help you find a method of pumping that works best for you and give you guidelines about how long it is safe to store breast milk. Caring for your breasts while you breastfeed Nipples can become dry, cracked, and sore while breastfeeding. The following recommendations can help keep your breasts moisturized and healthy:  Avoid using soap on your nipples.  Wear a supportive bra designed especially for nursing. Avoid wearing underwire-style bras or extremely tight bras (sports bras).  Air-dry your nipples for 3-4 minutes after each feeding.  Use only cotton bra pads to absorb leaked breast milk. Leaking of breast milk between feedings is normal.  Use lanolin on your nipples after breastfeeding. Lanolin helps to maintain your skin's normal moisture barrier. Pure lanolin is not harmful (not toxic) to your baby. You may also hand express a few drops of breast milk and gently massage that milk into your nipples and allow the milk to air-dry.  In the first few weeks after giving birth, some women experience breast engorgement. Engorgement can make your breasts feel heavy, warm, and tender to the touch. Engorgement peaks within 3-5 days  after you give birth. The following recommendations can help to ease engorgement:  Completely empty your breasts while breastfeeding or pumping. You may want to start by applying warm, moist heat (in the shower or with warm, water-soaked hand towels) just before feeding or pumping. This increases circulation and helps the milk flow. If your baby does not completely empty your breasts while breastfeeding, pump any extra milk after he or she is finished.  Apply ice packs to your breasts immediately after breastfeeding or pumping, unless this is too uncomfortable for you. To do this: ? Put ice in a plastic bag. ? Place a towel between your skin and  the bag. ? Leave the ice on for 20 minutes, 2-3 times a day.  Make sure that your baby is latched on and positioned properly while breastfeeding.  If engorgement persists after 48 hours of following these recommendations, contact your health care provider or a Advertising copywriter. Overall health care recommendations while breastfeeding  Eat 3 healthy meals and 3 snacks every day. Well-nourished mothers who are breastfeeding need an additional 450-500 calories a day. You can meet this requirement by increasing the amount of a balanced diet that you eat.  Drink enough water to keep your urine pale yellow or clear.  Rest often, relax, and continue to take your prenatal vitamins to prevent fatigue, stress, and low vitamin and mineral levels in your body (nutrient deficiencies).  Do not use any products that contain nicotine or tobacco, such as cigarettes and e-cigarettes. Your baby may be harmed by chemicals from cigarettes that pass into breast milk and exposure to secondhand smoke. If you need help quitting, ask your health care provider.  Avoid alcohol.  Do not use illegal drugs or marijuana.  Talk with your health care provider before taking any medicines. These include over-the-counter and prescription medicines as well as vitamins and herbal  supplements. Some medicines that may be harmful to your baby can pass through breast milk.  It is possible to become pregnant while breastfeeding. If birth control is desired, ask your health care provider about options that will be safe while breastfeeding your baby. Where to find more information: [employer]: www.llli.org Contact a health care provider if:  You feel like you want to stop breastfeeding or have become frustrated with breastfeeding.  Your nipples are cracked or bleeding.  Your breasts are red, tender, or warm.  You have: ? Painful breasts or nipples. ? A swollen area on either breast. ? A fever or chills. ? Nausea or vomiting. ? Drainage other than breast milk from your nipples.  Your breasts do not become full before feedings by the fifth day after you give birth.  You feel sad and depressed.  Your baby is: ? Too sleepy to eat well. ? Having trouble sleeping. ? More than 85 week old and wetting fewer than 6 diapers in a 24-hour period. ? Not gaining weight by 21 days of age.  Your baby has fewer than 3 stools in a 24-hour period.  Your baby's skin or the white parts of his or her eyes become yellow. Get help right away if:  Your baby is overly tired (lethargic) and does not want to wake up and feed.  Your baby develops an unexplained fever. Summary  Breastfeeding offers many health benefits for infant and mothers.  Try to breastfeed your infant when he or she shows early signs of hunger.  Gently tickle or stroke your baby's lips with your finger or nipple to allow the baby to open his or her mouth. Bring the baby to your breast. Make sure that much of the areola is in your baby's mouth. Offer one side and burp the baby before you offer the other side.  Talk with your health care provider or lactation consultant if you have questions or you face problems as you breastfeed. This information is not intended to replace advice given to you  by your health care provider. Make sure you discuss any questions you have with your health care provider. Document Released: 09/28/2005 Document Revised: 10/30/2016 Document Reviewed: 10/30/2016 Elsevier Interactive Patient Education  2018 [employer].  Second  Trimester of Pregnancy The second trimester is from week 13 through week 28, month 4 through 6. This is often the time in pregnancy that you feel your best. Often times, morning sickness has lessened or quit. You may have more energy, and you may get hungry more often. Your unborn baby (fetus) is growing rapidly. At the end of the sixth month, he or she is about 9 inches long and weighs about 1 pounds. You will likely feel the baby move (quickening) between 18 and 20 weeks of pregnancy. Follow these instructions at home:  Avoid all smoking, herbs, and alcohol. Avoid drugs not approved by your doctor.  Do not use any tobacco products, including cigarettes, chewing tobacco, and electronic cigarettes. If you need help quitting, ask your doctor. You may get counseling or other support to help you quit.  Only take medicine as told by your doctor. Some medicines are safe and some are not during pregnancy.  Exercise only as told by your doctor. Stop exercising if you start having cramps.  Eat regular, healthy meals.  Wear a good support bra if your breasts are tender.  Do not use hot tubs, steam rooms, or saunas.  Wear your seat belt when driving.  Avoid raw meat, uncooked cheese, and liter boxes and soil used by cats.  Take your prenatal vitamins.  Take 1500-2000 milligrams of calcium daily starting at the 20th week of pregnancy until you deliver your baby.  Try taking medicine that helps you poop (stool softener) as needed, and if your doctor approves. Eat more fiber by eating fresh fruit, vegetables, and whole grains. Drink enough fluids to keep your pee (urine) clear or pale yellow.  Take warm water baths (sitz baths) to soothe  pain or discomfort caused by hemorrhoids. Use hemorrhoid cream if your doctor approves.  If you have puffy, bulging veins (varicose veins), wear support hose. Raise (elevate) your feet for 15 minutes, 3-4 times a day. Limit salt in your diet.  Avoid heavy lifting, wear low heals, and sit up straight.  Rest with your legs raised if you have leg cramps or low back pain.  Visit your dentist if you have not gone during your pregnancy. Use a soft toothbrush to brush your teeth. Be gentle when you floss.  You can have sex (intercourse) unless your doctor tells you not to.  Go to your doctor visits. Get help if:  You feel dizzy.  You have mild cramps or pressure in your lower belly (abdomen).  You have a nagging pain in your belly area.  You continue to feel sick to your stomach (nauseous), throw up (vomit), or have watery poop (diarrhea).  You have bad smelling fluid coming from your vagina.  You have pain with peeing (urination). Get help right away if:  You have a fever.  You are leaking fluid from your vagina.  You have spotting or bleeding from your vagina.  You have severe belly cramping or pain.  You lose or gain weight rapidly.  You have trouble catching your breath and have chest pain.  You notice sudden or extreme puffiness (swelling) of your face, hands, ankles, feet, or legs.  You have not felt the baby move in over an hour.  You have severe headaches that do not go away with medicine.  You have vision changes. This information is not intended to replace advice given to you by your health care provider. Make sure you discuss any questions you have with your health care provider.  Document Released: 12/23/2009 Document Revised: 03/05/2016 Document Reviewed: 11/29/2012 Elsevier Interactive Patient Education  2017 [employer].

## 2018-03-01 NOTE — Progress Notes (Signed)
Taylor Carson is a 27 y.o. G2P0010 at [redacted]w[redacted]d here for routine follow up.  She reports occasional pelvic pressure, not regular in frequency, doesn't feel like contractions, mostly when walking a lot. Works in Therapist, occupational at [employer]. Dad is Loman [place].  See flow sheet for details.  A/P: Pregnancy at [redacted]w[redacted]d.  Doing well.   Pregnancy issues include none. She has been seeing multiple residents, offered me as OB provider, she would like this.   Anatomy scan reviewed, problems are not noted. Recommended 4 week follow up for repeat views of spine and heart. Ordered today, morning preferred.  Reviewed childbirth classes, educated on breastfeeding basics, patient interested in this.  OB box updated.  Weight trend is relatively flat so far, but BMI pre pregnancy was in obesity range, goal weight gain for pregnancy is 11-20lbs or 0.5lb/wk 2nd trimester and onward.  Preterm labor precautions reviewed. Follow up 4 weeks for 28wk labs and Tdap.    Loni Muse, MD

## 2018-03-03 ENCOUNTER — Other Ambulatory Visit: Payer: Medicaid Other

## 2018-03-04 ENCOUNTER — Other Ambulatory Visit: Payer: Medicaid Other

## 2018-03-11 ENCOUNTER — Other Ambulatory Visit: Payer: Medicaid Other

## 2018-03-11 DIAGNOSIS — Z3A15 15 weeks gestation of pregnancy: Secondary | ICD-10-CM

## 2018-03-14 ENCOUNTER — Telehealth: Payer: Self-pay | Admitting: *Deleted

## 2018-03-14 NOTE — Telephone Encounter (Signed)
-----   Message from Jennette Billobert L Busick, CMA sent at 03/14/2018 10:26 AM EDT ----- Regarding: FW: labs   ----- Message ----- From: Garth Bignessimberlake, Kathryn, MD Sent: 03/11/2018  11:48 AM To: Jennette Billobert L Busick, CMA Subject: RE: labs                                       Looks like she was scheduled for a 3 hr on 5/23 from your note? I didn't order an AFP. Could we have white team call her and reschedule the 3 hr?  ----- Message ----- From: Jennette BillBusick, Robert L, CMA Sent: 03/11/2018  10:59 AM To: Garth BignessKathryn Timberlake, MD Subject: labs                                           This patient came in this morning for labs, the only order in the system was for an AFP. When Clarissa asked me to put in the correct order I realized that she was too far along for an AFP and actually already had one done but by that point she was already gone. Do you know what she was supposed to have done? I'm thinking it may have been a 3 hr gtt. If so, someone will need to call her and tell her to return for that.

## 2018-03-14 NOTE — Telephone Encounter (Signed)
Contacted pt and scheduled her an appointment for this on 03/17/18 @ 8:30am. Lamonte SakaiZimmerman Rumple, Glanda Spanbauer D, CMA

## 2018-03-15 ENCOUNTER — Ambulatory Visit (HOSPITAL_COMMUNITY)
Admission: RE | Admit: 2018-03-15 | Discharge: 2018-03-15 | Disposition: A | Discharge status: Home / Self Care | Payer: Medicaid Other | Source: Ambulatory Visit | Attending: Family Medicine | Admitting: Family Medicine

## 2018-03-15 DIAGNOSIS — Z3402 Encounter for supervision of normal first pregnancy, second trimester: Secondary | ICD-10-CM

## 2018-03-15 DIAGNOSIS — O99212 Obesity complicating pregnancy, second trimester: Secondary | ICD-10-CM | POA: Diagnosis not present

## 2018-03-15 DIAGNOSIS — Z362 Encounter for other antenatal screening follow-up: Secondary | ICD-10-CM | POA: Diagnosis not present

## 2018-03-15 DIAGNOSIS — Z3A26 26 weeks gestation of pregnancy: Secondary | ICD-10-CM | POA: Insufficient documentation

## 2018-03-15 DIAGNOSIS — O99332 Smoking (tobacco) complicating pregnancy, second trimester: Secondary | ICD-10-CM | POA: Insufficient documentation

## 2018-03-16 ENCOUNTER — Encounter: Payer: Self-pay | Admitting: Family Medicine

## 2018-03-17 ENCOUNTER — Other Ambulatory Visit (INDEPENDENT_AMBULATORY_CARE_PROVIDER_SITE_OTHER): Payer: Medicaid Other

## 2018-03-17 DIAGNOSIS — Z131 Encounter for screening for diabetes mellitus: Secondary | ICD-10-CM | POA: Diagnosis not present

## 2018-03-17 DIAGNOSIS — Z3A26 26 weeks gestation of pregnancy: Secondary | ICD-10-CM

## 2018-03-17 DIAGNOSIS — Z331 Pregnant state, incidental: Secondary | ICD-10-CM

## 2018-03-17 LAB — POCT CBG (FASTING - GLUCOSE)-MANUAL ENTRY: GLUCOSE FASTING, POC: 86 mg/dL (ref 70–99)

## 2018-03-18 LAB — GESTATIONAL GLUCOSE TOLERANCE
GLUCOSE 1 HOUR GTT: 120 mg/dL (ref 65–179)
Glucose, Fasting: 74 mg/dL (ref 65–94)
Glucose, GTT - 2 Hour: 96 mg/dL (ref 65–154)
Glucose, GTT - 3 Hour: 85 mg/dL (ref 65–139)

## 2018-03-22 NOTE — Progress Notes (Signed)
   Subjective:    Patient ID: Taylor Carson, female    DOB: 07/21/1991, 27 y.o.   MRN: [id number]   CC: cough/congestion/shortness of breath  HPI: Cough/congestion/Shortness of breath Patient reports cough, congestion, shortness of breath x2 weeks.  Patient also has associated sputum production, states it is white now but used to be yellow and thick at the start.  Denies any symptoms of fever or chills. Denies runny nose. Patient denies any sick contacts.  Patient does report frequent heartburn and acid reflux.  No previous history of asthma.  No chest pain.  No swelling in lower extremities.  Patient reports shortness of breath only occurs with exertion.  States she never has to stop what she is doing but when she does stop after walking a lot she notices she feels short of breath.  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  Denies any smoke exposure or tobacco use.  Denies any medication use.    Objective:  BP 122/62   Pulse 84   Temp 98.5 F (36.9 C)   Wt 195 lb 6.4 oz (88.6 kg)   LMP 09/13/2017 (Exact Date)   BMI 33.54 kg/m  Vitals and nursing note reviewed  General: well nourished, in no acute distress HEENT: normocephalic, moist mucous membranes Neck: supple, non-tender, without lymphadenopathy Cardiac: RRR, clear S1 and S2, grade 2/6 systolic murmur, no rubs or gallops Respiratory: clear to auscultation bilaterally, no increased work of breathing Abdomen: soft, nontender, nondistended, no masses or organomegaly. Bowel sounds present Extremities: no edema or cyanosis. Warm, well perfused. 2+ radial and PT pulses bilaterally Skin: warm and dry, no rashes noted Neuro: alert and oriented, no focal deficits   Assessment & Plan:    Shortness of breath Appears to be normal physiologic changes of pregnancy.  Does not appear to be infectious as no fever or chills or sick contacts.  Possibly secondary to acid reflux as  patient reports frequent heartburn symptoms. Physical exam reassuring for no DVT as there is no leg swelling, calf tenderness, and negative Homans sign on physical exam.  Unlikely PE as vitals are currently stable and has been occurring progressively over 2 weeks rather than acute event.  Patient very active and non-sedentary.  Patient does have systolic murmur on exam, however this is likely physiologic changes of pregnancy due to increased cardiac output.  Strict return precautions given to patient.  Patient instructed to go to [facility] with further worsening shortness of breath, or shortness of breath at rest.  Patient informed to go to [facility] with any chest pain or palpitations. -Patient instructed on where [facility] is, verbalized understanding and reports she will go with any concerns -Strict return precautions given -Instructed patient to call on Friday to either touch base with me or Dr. Chanetta Marshallimberlake (her OB) to touch base on how she is doing -Follow-up in 1 week, patient already has scheduled OB follow-up on June 19th  Discussed patient with Dr. Leona SingletonLake    Return in about 1 week (around 03/30/2018).   Oralia ManisSherin Jesson Foskey, DO, PGY-1

## 2018-03-23 ENCOUNTER — Ambulatory Visit (INDEPENDENT_AMBULATORY_CARE_PROVIDER_SITE_OTHER): Payer: Medicaid Other | Admitting: Family Medicine

## 2018-03-23 ENCOUNTER — Other Ambulatory Visit: Payer: Self-pay

## 2018-03-23 ENCOUNTER — Encounter (HOSPITAL_COMMUNITY): Payer: Self-pay | Admitting: *Deleted

## 2018-03-23 ENCOUNTER — Inpatient Hospital Stay (HOSPITAL_COMMUNITY)
Admission: AD | Admit: 2018-03-23 | Discharge: 2018-03-23 | Disposition: A | Discharge status: Home / Self Care | Payer: Medicaid Other | Source: Ambulatory Visit | Attending: Obstetrics and Gynecology | Admitting: Obstetrics and Gynecology

## 2018-03-23 ENCOUNTER — Encounter: Payer: Self-pay | Admitting: Family Medicine

## 2018-03-23 DIAGNOSIS — R102 Pelvic and perineal pain: Secondary | ICD-10-CM | POA: Insufficient documentation

## 2018-03-23 DIAGNOSIS — Z87891 Personal history of nicotine dependence: Secondary | ICD-10-CM | POA: Insufficient documentation

## 2018-03-23 DIAGNOSIS — N898 Other specified noninflammatory disorders of vagina: Secondary | ICD-10-CM | POA: Diagnosis not present

## 2018-03-23 DIAGNOSIS — O26892 Other specified pregnancy related conditions, second trimester: Secondary | ICD-10-CM | POA: Insufficient documentation

## 2018-03-23 DIAGNOSIS — Z3A27 27 weeks gestation of pregnancy: Secondary | ICD-10-CM | POA: Insufficient documentation

## 2018-03-23 DIAGNOSIS — R0602 Shortness of breath: Secondary | ICD-10-CM | POA: Diagnosis not present

## 2018-03-23 DIAGNOSIS — N949 Unspecified condition associated with female genital organs and menstrual cycle: Secondary | ICD-10-CM

## 2018-03-23 LAB — URINALYSIS, ROUTINE W REFLEX MICROSCOPIC
BILIRUBIN URINE: NEGATIVE
GLUCOSE, UA: NEGATIVE mg/dL
Hgb urine dipstick: NEGATIVE
KETONES UR: NEGATIVE mg/dL
LEUKOCYTES UA: NEGATIVE
NITRITE: NEGATIVE
PH: 6 (ref 5.0–8.0)
Protein, ur: NEGATIVE mg/dL
SPECIFIC GRAVITY, URINE: 1.017 (ref 1.005–1.030)

## 2018-03-23 LAB — WET PREP, GENITAL
CLUE CELLS WET PREP: NONE SEEN
SPERM: NONE SEEN
TRICH WET PREP: NONE SEEN
Yeast Wet Prep HPF POC: NONE SEEN

## 2018-03-23 NOTE — Patient Instructions (Signed)
It was a pleasure seeing you today.   Today we discussed your cough, congestion, and shortness of breath  For your shortness of breath: your lungs sounded great today! You also had no swelling in your legs which is re-assuring. If you start having shortness of breat at rest, chest pain, palpitations, or the shortness of breath is becoming worse [facility]   Please follow up in 1 week or sooner if symptoms persist or worsen. Please call the clinic immediately if you have any concerns.   Our clinic's number is [phone]. Please call with questions or concerns.   [facility] if you are having worsening shortness of breath or the shortness of breath does not improve, you have leg swelling with shortness of breath, you have chest pain, or palpitations.   Thank you,  Oralia ManisSherin Stephen Baruch, DO

## 2018-03-23 NOTE — Assessment & Plan Note (Addendum)
Appears to be normal physiologic changes of pregnancy.  Does not appear to be infectious as no fever or chills or sick contacts.  Possibly secondary to acid reflux as patient reports frequent heartburn symptoms. Physical exam reassuring for no DVT as there is no leg swelling, calf tenderness, and negative Homans sign on physical exam.  Unlikely PE as vitals are currently stable and has been occurring progressively over 2 weeks rather than acute event.  Patient very active and non-sedentary.  Patient does have systolic murmur on exam, however this is likely physiologic changes of pregnancy due to increased cardiac output.  Strict return precautions given to patient.  Patient instructed to go to MAU with further worsening shortness of breath, or shortness of breath at rest.  Patient informed to go to MAU with any chest pain or palpitations. -Patient instructed on where MAU is, verbalized understanding and reports she will go with any concerns -Strict return precautions given -Instructed patient to call on Friday to either touch base with me or Dr. Chanetta Marshallimberlake (her OB) to touch base on how she is doing -Follow-up in 1 week, patient already has scheduled OB follow-up on June 19th  Discussed patient with Dr. Leona SingletonLake

## 2018-03-23 NOTE — MAU Provider Note (Signed)
History     CSN: [id number]  Arrival date and time: 03/23/18 1517   First Provider Initiated Contact with Patient 03/23/18 1545      Chief Complaint  Patient presents with  . Pelvic Pain   HPI  [name] is a 27 y.o. G2P0010 at [redacted]w[redacted]d who presents to MAU today with complaint of clear vaginal discharge and pelvic pain with walking. She denies vaginal bleeding or contractions. She took Tylenol last night which helped. She has not tried an abdominal binder. She reports normal fetal movement.   OB History    Gravida  2   Para      Term      Preterm      AB  1   Living        SAB      TAB      Ectopic  1   Multiple      Live Births              Past Medical History:  Diagnosis Date  . Anxiety   . Depression   . Ectopic pregnancy 03/2017  . Hyperthyroidism   . Shortness of breath 03/23/2018    Past Surgical History:  Procedure Laterality Date  . NO PAST SURGERIES      Family History  Problem Relation Age of Onset  . Miscarriages / [place] Mother   . Cancer Maternal Grandfather   . Diabetes Paternal Grandmother   . Heart disease Paternal Grandmother     Social History   Tobacco Use  . Smoking status: Former Smoker    Packs/day: 0.25    Types: Cigarettes    Last attempt to quit: 09/11/2017    Years since quitting: 0.5  . Smokeless tobacco: Never Used  Substance Use Topics  . Alcohol use: No  . Drug use: Yes    Frequency: 1.0 times per week    Types: Marijuana, Cocaine    Comment: Marijuana use 12/12/17    Allergies: No Known Allergies  Medications Prior to Admission  Medication Sig Dispense Refill Last Dose  . acetaminophen (TYLENOL) 500 MG tablet Take 500 mg by mouth every 6 (six) hours as needed for mild pain or headache.   03/22/2018 at Unknown time  . Prenatal Vit-Fe Fumarate-FA (PRENATAL MULTIVITAMIN) TABS tablet Take 1 tablet by mouth daily at 12 noon.   03/23/2018 at Unknown time  . Prenatal MV & Min w/FA-DHA (ALIVE  PRENATAL) 0.4-25 MG CHEW Chew 1 tablet by mouth daily. (Patient not taking: Reported on 03/23/2018) 90 tablet 3 Not Taking at Unknown time    Review of Systems  Constitutional: Negative for fever.  Gastrointestinal: Positive for abdominal pain. Negative for constipation, diarrhea, nausea and vomiting.  Genitourinary: Positive for vaginal discharge. Negative for dysuria, frequency, urgency and vaginal bleeding.   Physical Exam   Blood pressure 121/70, pulse 91, temperature 97.8 F (36.6 C), temperature source Oral, resp. rate 15, height 5\' 3"  (1.6 m), weight 195 lb (88.5 kg), last menstrual period 09/13/2017, SpO2 99 %, unknown if currently breastfeeding.  Physical Exam  Nursing note and vitals reviewed. Constitutional: She is oriented to person, place, and time. She appears well-developed and well-nourished. No distress.  HENT:  Head: Normocephalic and atraumatic.  Cardiovascular: Normal rate.  Respiratory: Effort normal.  GI: Soft. She exhibits no distension and no mass. There is no tenderness. There is no rebound and no guarding.  Neurological: She is alert and oriented to person, place, and time.  Skin: Skin is warm and dry. No erythema.  Psychiatric: She has a normal mood and affect.  Dilation: Closed Effacement (%): Thick Cervical Position: Posterior Exam by:: Vonzella NippleJulie Wenzel, PA  Results for orders placed or performed during the hospital encounter of 03/23/18 (from the past 24 hour(s))  Urinalysis, Routine w reflex microscopic     Status: Abnormal   Collection Time: 03/23/18  3:41 PM  Result Value Ref Range   Color, Urine YELLOW YELLOW   APPearance HAZY (A) CLEAR   Specific Gravity, Urine 1.017 1.005 - 1.030   pH 6.0 5.0 - 8.0   Glucose, UA NEGATIVE NEGATIVE mg/dL   Hgb urine dipstick NEGATIVE NEGATIVE   Bilirubin Urine NEGATIVE NEGATIVE   Ketones, ur NEGATIVE NEGATIVE mg/dL   Protein, ur NEGATIVE NEGATIVE mg/dL   Nitrite NEGATIVE NEGATIVE   Leukocytes, UA NEGATIVE  NEGATIVE  Wet prep, genital     Status: Abnormal   Collection Time: 03/23/18  3:56 PM  Result Value Ref Range   Yeast Wet Prep HPF POC NONE SEEN NONE SEEN   Trich, Wet Prep NONE SEEN NONE SEEN   Clue Cells Wet Prep HPF POC NONE SEEN NONE SEEN   WBC, Wet Prep HPF POC FEW (A) NONE SEEN   Sperm NONE SEEN     Fetal Monitoring: Baseline: 140 bpm Variability: moderate Accelerations: 10 x 10 Decelerations: none Contractions: none   MAU Course  Procedures None  MDM UA and wet prep today  EFM shows no contractions on TOCO. Cervix closed. Pain with ambulation only likely round ligament pain.   Assessment and Plan  A: SIUP at 5515w2d Round ligament pain  Vaginal discharge in pregnancy, second trimester   P: Discharge home Tylenol PRN advised Discussed abdominal binder, hydrotherapy and moderation of activity  Preterm labor precautions discussed Patient advised to follow-up with [facility] as scheduled for routine prenatal care or sooner PRN Patient may return to MAU as needed or if her condition were to change or worsen  Vonzella NippleJulie Wenzel, PA-C 03/23/2018, 4:43 PM

## 2018-03-23 NOTE — MAU Note (Signed)
Pt reports pain and pressure in pelvic area when she stands up to walk. Denies bleeding or ROM. Reports good fetal movement

## 2018-03-23 NOTE — Discharge Instructions (Signed)

## 2018-03-30 ENCOUNTER — Encounter: Payer: Self-pay | Admitting: Family Medicine

## 2018-03-30 ENCOUNTER — Ambulatory Visit (INDEPENDENT_AMBULATORY_CARE_PROVIDER_SITE_OTHER): Payer: Medicaid Other | Admitting: Family Medicine

## 2018-03-30 ENCOUNTER — Other Ambulatory Visit: Payer: Self-pay

## 2018-03-30 VITALS — BP 112/62 | HR 92 | Temp 98.3°F | Wt 195.0 lb

## 2018-03-30 DIAGNOSIS — Z3A28 28 weeks gestation of pregnancy: Secondary | ICD-10-CM

## 2018-03-30 DIAGNOSIS — Z23 Encounter for immunization: Secondary | ICD-10-CM | POA: Diagnosis present

## 2018-03-30 DIAGNOSIS — Z3493 Encounter for supervision of normal pregnancy, unspecified, third trimester: Secondary | ICD-10-CM

## 2018-03-30 MED ORDER — SERTRALINE HCL 25 MG PO TABS: 25.0000 mg | ORAL_TABLET | Freq: Every day | ORAL | 0 refills | Status: DC

## 2018-03-30 MED ORDER — RANITIDINE HCL 75 MG PO TABS: 75.0000 mg | ORAL_TABLET | Freq: Every day | ORAL | 0 refills | Status: DC

## 2018-03-30 NOTE — Progress Notes (Signed)
Taylor Carson is a 27 y.o. G2P0010 at 6147w2d here for routine follow up.  Taylor Carson reports cough as below. See flow sheet for details.  Dry cough - persistent, no SOB. Feels maybe acid, tried ginger tea. Previously seen here.   Baby is really active. Taylor Carson is NOT smoking and has not at all since December. Reviewed US results on the portal. Baby will be Taylor "davion."   A/P: Pregnancy at 5847w2d.  Doing well.   Pregnancy issues include none.   Infant feeding choice: breastfeeding, signed up for BF class Contraception choice: considering LARC, possible IUD Infant circumcision desired yes - maybe here   Depression - never been on meds, counseled with Taylor Poundeborah before. On days off, Taylor Carson sits in her dark room without talking to anyone, not answering her phone. Thinks this began after her concussion this past fall and ectopic pregnancy. Has tried other therapy in the past as a teen. Previous dx of depression and anxiety. PHQ 9 12, GAD 7 15 today. Desires medication. Precepted with Dr. Lum BabeEniola regarding moderate severity of depression and moderate to severe anxiety with limiting quality of life, will begin low dose SSRI, sertraline. Patient counseled on low risk of teratogenicity and black box warning for increased suicidality. May need to uptitrate dose at next visit.   Growth US - previously discussed US findings with Dr. Vergie LivingPickens, OB. He recommended offering US to patient. However, given that Taylor Carson has not been smoking AT ALL, and is measuring appropriately, patient elects to forgo additional US.   Cough - recommended tums and zantac if no relief from tums. Likely increased secretions of pregnancy.   Tdap was given today. Passed 3 hour earlier, CBC, RPR, and HIV were done today.   Pregnancy medical home and PHQ-9 forms were done today and reviewed.   Rh status was reviewed and patient does not need Rhogam.  Rhogam was not given today.   Childbirth and education classes were offered - Taylor Carson is going,  recommended the daddy class for FOB.  Preterm labor and fetal movement precautions reviewed. Follow up 2 weeks.  Loni MuseKate Otelia Hettinger, MD

## 2018-03-30 NOTE — Patient Instructions (Addendum)
It was a pleasure to see you today! Thank you for choosing [facility] for your primary care. Taylor Carson was seen for pregnancy.   Our plans for today were:  Please try tums for the cough, if this doesn't help you can try the zantac I prescribed.   I will call you if labs are abnormal.   Please pick up the sertraline for your mood.   You should return to our clinic to see Dr. Chanetta Marshallimberlake in 2 weeks for OB.   Best,  Dr. Chanetta Marshallimberlake

## 2018-03-31 LAB — CBC
HEMATOCRIT: 34.9 % (ref 34.0–46.6)
Hemoglobin: 11.5 g/dL (ref 11.1–15.9)
MCH: 28.3 pg (ref 26.6–33.0)
MCHC: 33 g/dL (ref 31.5–35.7)
MCV: 86 fL (ref 79–97)
Platelets: 242 10*3/uL (ref 150–450)
RBC: 4.07 x10E6/uL (ref 3.77–5.28)
RDW: 14.4 % (ref 12.3–15.4)
WBC: 8.8 10*3/uL (ref 3.4–10.8)

## 2018-03-31 LAB — HIV ANTIBODY (ROUTINE TESTING W REFLEX): HIV Screen 4th Generation wRfx: NONREACTIVE

## 2018-03-31 LAB — RPR: RPR: NONREACTIVE

## 2018-04-13 ENCOUNTER — Other Ambulatory Visit: Payer: Self-pay

## 2018-04-13 ENCOUNTER — Ambulatory Visit (INDEPENDENT_AMBULATORY_CARE_PROVIDER_SITE_OTHER): Payer: Medicaid Other | Admitting: Family Medicine

## 2018-04-13 VITALS — BP 100/61 | HR 100 | Temp 98.3°F | Wt 196.0 lb

## 2018-04-13 DIAGNOSIS — F3289 Other specified depressive episodes: Secondary | ICD-10-CM

## 2018-04-13 DIAGNOSIS — Z3403 Encounter for supervision of normal first pregnancy, third trimester: Secondary | ICD-10-CM

## 2018-04-13 NOTE — Progress Notes (Signed)
Taylor Carson is a 27 y.o. G2P0010 at 4573w2d here for routine follow up.  She reports she stopped her zoloft this week. She felt like she was more drowsy, and more angry. After work she continues to sleep most days. PHQ 9 14, GAD 7 11. She is hesistant that if she tells me yes to therapy she won't actually go. Would be willing to try psych. Feels depression preceded pregnancy.   See flow sheet for details.  A/P: Pregnancy at 1573w2d.  Doing well.   Pregnancy issues include none.  Infant feeding choice: breastfeeding Contraception choice: still thinking [facility]  Tdap was not given today - last visit.   Depression - patient wants to try a psych referral to see about alternative meds, she is still hesitant to pursue therapy. We will discontinue zoloft as this did not agree with her.   Preterm labor and fetal movement precautions reviewed. Safe sleep discussed. Follow up 2 weeks.

## 2018-04-13 NOTE — Patient Instructions (Signed)
Call us if you don't hear from [facility] within the week.    How a Baby Grows During Pregnancy Pregnancy begins when a female's sperm enters a female's egg (fertilization). This happens in one of the tubes (fallopian tubes) that connect the ovaries to the womb (uterus). The fertilized egg is called an embryo until it reaches 10 weeks. From 10 weeks until birth, it is called a fetus. The fertilized egg moves down the fallopian tube to the uterus. Then it implants into the lining of the uterus and begins to grow. The developing fetus receives oxygen and nutrients through the pregnant woman's bloodstream and the tissues that grow (placenta) to support the fetus. The placenta is the life support system for the fetus. It provides nutrition and removes waste. Learning as much as you can about your pregnancy and how your baby is developing can help you enjoy the experience. It can also make you aware of when there might be a problem and when to ask questions. How long does a typical pregnancy last? A pregnancy usually lasts 280 days, or about 40 weeks. Pregnancy is divided into three trimesters:  First trimester: 0-13 weeks.  Second trimester: 14-27 weeks.  Third trimester: 28-40 weeks.  The day when your baby is considered ready to be born (full term) is your estimated date of delivery. How does my baby develop month by month? First month  The fertilized egg attaches to the inside of the uterus.  Some cells will form the placenta. Others will form the fetus.  The arms, legs, brain, spinal cord, lungs, and heart begin to develop.  At the end of the first month, the heart begins to beat.  Second month  The bones, inner ear, eyelids, hands, and feet form.  The genitals develop.  By the end of 8 weeks, all major organs are developing.  Third month  All of the internal organs are forming.  Teeth develop below the gums.  Bones and muscles begin to grow. The spine can  flex.  The skin is transparent.  Fingernails and toenails begin to form.  Arms and legs continue to grow longer, and hands and feet develop.  The fetus is about 3 in (7.6 cm) long.  Fourth month  The placenta is completely formed.  The external sex organs, neck, outer ear, eyebrows, eyelids, and fingernails are formed.  The fetus can hear, swallow, and move its arms and legs.  The kidneys begin to produce urine.  The skin is covered with a white waxy coating (vernix) and very fine hair (lanugo).  Fifth month  The fetus moves around more and can be felt for the first time (quickening).  The fetus starts to sleep and wake up and may begin to suck its finger.  The nails grow to the end of the fingers.  The organ in the digestive system that makes bile (gallbladder) functions and helps to digest the nutrients.  If your baby is a girl, eggs are present in her ovaries. If your baby is a boy, testicles start to move down into his scrotum.  Sixth month  The lungs are formed, but the fetus is not yet able to breathe.  The eyes open. The brain continues to develop.  Your baby has fingerprints and toe prints. Your baby's hair grows thicker.  At the end of the second trimester, the fetus is about 9 in (22.9 cm) long.  Seventh month  The fetus kicks and stretches.  The eyes are developed  enough to sense changes in light.  The hands can make a grasping motion.  The fetus responds to sound.  Eighth month  All organs and body systems are fully developed and functioning.  Bones harden and taste buds develop. The fetus may hiccup.  Certain areas of the brain are still developing. The skull remains soft.  Ninth month  The fetus gains about  lb (0.23 kg) each week.  The lungs are fully developed.  Patterns of sleep develop.  The fetus's head typically moves into a head-down position (vertex) in the uterus to prepare for birth. If the buttocks move into a vertex  position instead, the baby is breech.  The fetus weighs 6-9 lbs (2.72-4.08 kg) and is 19-20 in (48.26-50.8 cm) long.  What can I do to have a healthy pregnancy and help my baby develop? Eating and Drinking  Eat a healthy diet. ? Talk with your health care provider to make sure that you are getting the nutrients that you and your baby need. ? Visit www.DisposableNylon.be to learn about creating a healthy diet.  Gain a healthy amount of weight during pregnancy as advised by your health care provider. This is usually 25-35 pounds. You may need to: ? Gain more if you were underweight before getting pregnant or if you are pregnant with more than one baby. ? Gain less if you were overweight or obese when you got pregnant.  Medicines and Vitamins  Take prenatal vitamins as directed by your health care provider. These include vitamins such as folic acid, iron, calcium, and vitamin D. They are important for healthy development.  Take medicines only as directed by your health care provider. Read labels and ask a pharmacist or your health care provider whether over-the-counter medicines, supplements, and prescription drugs are safe to take during pregnancy.  Activities  Be physically active as advised by your health care provider. Ask your health care provider to recommend activities that are safe for you to do, such as walking or swimming.  Do not participate in strenuous or extreme sports.  Lifestyle  Do not drink alcohol.  Do not use any tobacco products, including cigarettes, chewing tobacco, or electronic cigarettes. If you need help quitting, ask your health care provider.  Do not use illegal drugs.  Safety  Avoid exposure to mercury, lead, or other heavy metals. Ask your health care provider about common sources of these heavy metals.  Avoid listeria infection during pregnancy. Follow these precautions: ? Do not eat soft cheeses or deli meats. ? Do not eat hot dogs unless they  have been warmed up to the point of steaming, such as in the microwave oven. ? Do not drink unpasteurized milk.  Avoid toxoplasmosis infection during pregnancy. Follow these precautions: ? Do not change your cat's litter box, if you have a cat. Ask someone else to do this for you. ? Wear gardening gloves while working in the yard.  General Instructions  Keep all follow-up visits as directed by your health care provider. This is important. This includes prenatal care and screening tests.  Manage any chronic health conditions. Work closely with your health care provider to keep conditions, such as diabetes, under control.  How do I know if my baby is developing well? At each prenatal visit, your health care provider will do several different tests to check on your health and keep track of your baby's development. These include:  Fundal height. ? Your health care provider will measure your growing belly from top  to bottom using a tape measure. ? Your health care provider will also feel your belly to determine your baby's position.  Heartbeat. ? An ultrasound in the first trimester can confirm pregnancy and show a heartbeat, depending on how far along you are. ? Your health care provider will check your baby's heart rate at every prenatal visit. ? As you get closer to your delivery date, you may have regular fetal heart rate monitoring to make sure that your baby is not in distress.  Second trimester ultrasound. ? This ultrasound checks your baby's development. It also indicates your baby's gender.  What should I do if I have concerns about my baby's development? Always talk with your health care provider about any concerns that you may have. This information is not intended to replace advice given to you by your health care provider. Make sure you discuss any questions you have with your health care provider. Document Released: 03/16/2008 Document Revised: 03/05/2016 Document Reviewed:  03/07/2014 Elsevier Interactive Patient Education  [employer]2018 Elsevier [employer].

## 2018-04-21 ENCOUNTER — Inpatient Hospital Stay (HOSPITAL_COMMUNITY)
Admission: AD | Admit: 2018-04-21 | Discharge: 2018-04-21 | Disposition: A | Discharge status: Home / Self Care | Payer: Medicaid Other | Source: Ambulatory Visit | Attending: Obstetrics and Gynecology | Admitting: Obstetrics and Gynecology

## 2018-04-21 ENCOUNTER — Encounter (HOSPITAL_COMMUNITY): Payer: Self-pay | Admitting: *Deleted

## 2018-04-21 DIAGNOSIS — E669 Obesity, unspecified: Secondary | ICD-10-CM | POA: Insufficient documentation

## 2018-04-21 DIAGNOSIS — O99213 Obesity complicating pregnancy, third trimester: Secondary | ICD-10-CM | POA: Diagnosis not present

## 2018-04-21 DIAGNOSIS — Z87891 Personal history of nicotine dependence: Secondary | ICD-10-CM | POA: Diagnosis not present

## 2018-04-21 DIAGNOSIS — O368130 Decreased fetal movements, third trimester, not applicable or unspecified: Secondary | ICD-10-CM

## 2018-04-21 DIAGNOSIS — F419 Anxiety disorder, unspecified: Secondary | ICD-10-CM | POA: Diagnosis not present

## 2018-04-21 DIAGNOSIS — Z3A31 31 weeks gestation of pregnancy: Secondary | ICD-10-CM | POA: Insufficient documentation

## 2018-04-21 DIAGNOSIS — O99343 Other mental disorders complicating pregnancy, third trimester: Secondary | ICD-10-CM | POA: Diagnosis not present

## 2018-04-21 DIAGNOSIS — Z3689 Encounter for other specified antenatal screening: Secondary | ICD-10-CM

## 2018-04-21 DIAGNOSIS — F329 Major depressive disorder, single episode, unspecified: Secondary | ICD-10-CM | POA: Diagnosis not present

## 2018-04-21 NOTE — MAU Provider Note (Signed)
History  CSN: [id number] Arrival date and time: 04/21/18 1528  First Provider Initiated Contact with Patient 04/21/18 1628    Chief Complaint  Patient presents with  . Decreased Fetal Movement    HPI: Taylor Carson is a 27 y.o. G2P0010 with IUP at 5980w3d who presents to maternity admissions reporting decreased fetal movement. She reports that she has not felt her baby move for the last 4 days. She reports baby is typically very active but has been unable to feel baby move since Sunday. Reports no other concerns, other than stating that when baby does move she feels kick down on her bladder and feels like she will urinate herself. Denies dysuria or hematuria. Denies LOF or vaginal bleeding.  [facility]. Pregnancy complicated by tobacco smoking and obesity..   OB History  Gravida Para Term Preterm AB Living  2       1    SAB TAB Ectopic Multiple Live Births      1        # Outcome Date GA Lbr Len/2nd Weight Sex Delivery Anes PTL Lv  2 Current           1 Ectopic            Past Medical History:  Diagnosis Date  . Anxiety   . Depression   . Ectopic pregnancy 03/2017  . Hyperthyroidism   . Shortness of breath 03/23/2018   Past Surgical History:  Procedure Laterality Date  . NO PAST SURGERIES     Family History  Problem Relation Age of Onset  . Miscarriages / [place] Mother   . Cancer Maternal Grandfather   . Diabetes Paternal Grandmother   . Heart disease Paternal Grandmother    Social History   Socioeconomic History  . Marital status: Single    Spouse name: Not on file  . Number of children: Not on file  . Years of education: Not on file  . Highest education level: Not on file  Occupational History  . Not on file  Social Needs  . Financial resource strain: Not on file  . Food insecurity:    Worry: Not on file    Inability: Not on file  . Transportation needs:    Medical: Not on file    Non-medical: Not on file  Tobacco Use  . Smoking  status: Former Smoker    Packs/day: 0.25    Types: Cigarettes    Last attempt to quit: 09/11/2017    Years since quitting: 0.6  . Smokeless tobacco: Never Used  Substance and Sexual Activity  . Alcohol use: No  . Drug use: Yes    Frequency: 1.0 times per week    Types: Marijuana, Cocaine    Comment: Marijuana use 12/12/17  . Sexual activity: Yes    Partners: Male    Birth control/protection: None  Lifestyle  . Physical activity:    Days per week: Not on file    Minutes per session: Not on file  . Stress: Not on file  Relationships  . Social connections:    Talks on phone: Not on file    Gets together: Not on file    Attends religious service: Not on file    Active member of club or organization: Not on file    Attends meetings of clubs or organizations: Not on file    Relationship status: Not on file  . Intimate partner violence:    Fear of current or ex partner:  Not on file    Emotionally abused: Not on file    Physically abused: Not on file    Forced sexual activity: Not on file  Other Topics Concern  . Not on file  Social History Narrative  . Not on file   No Known Allergies  Medications Prior to Admission  Medication Sig Dispense Refill Last Dose  . ranitidine (ZANTAC) 75 MG tablet Take 1 tablet (75 mg total) by mouth at bedtime. 90 tablet 0 04/20/2018 at Unknown time  . acetaminophen (TYLENOL) 500 MG tablet Take 500 mg by mouth every 6 (six) hours as needed for mild pain or headache.   04/18/2018  . sertraline (ZOLOFT) 25 MG tablet Take 1 tablet (25 mg total) by mouth daily. 60 tablet 0 04/14/2018    I have reviewed patient's Past Medical Hx, Surgical Hx, Family Hx, Social Hx, medications and allergies.   Review of Systems: Negative except for what is mentioned in HPI.  Physical Exam   Weight 199 lb (90.3 kg), last menstrual period 09/13/2017, unknown if currently breastfeeding.  Constitutional: Well-developed, well-nourished female in no acute distress.  HENT:  /AT, normal oropharynx mucosa. MMM Eyes: normal conjunctivae, no scleral icterus Cardiovascular: normal rate Respiratory: normal effort GI: Abd soft, non-tender, gravid appropriate for gestational age.  . Pelvic: deffered MSK: Extremities nontender, no edema Neurologic: Alert and oriented x 4. Psych: Normal mood and affect Skin: warm and dry   FHT:  Baseline 135 , moderate variability, accelerations present, no decelerations Toco: no ctx  MAU Course/MDM:   Nursing notes and VS reviewed. Patient seen and examined, as noted above.  Reactive NST   Assessment and Plan  Assessment: 1. Decreased fetal movements in third trimester, single or unspecified fetus   2. NST (non-stress test) reactive     Plan: --Discharge home in stable condition with fetal kick counts --Reviewed reasons to return to MAU  Degele, Kandra Nicolas, MD 04/21/2018 5:06 PM

## 2018-04-21 NOTE — Discharge Instructions (Signed)

## 2018-04-21 NOTE — MAU Note (Signed)
Pt reports she has not felt the baby move in the last 4 days. Denies pain at this time but reports she has pressure off and on.

## 2018-04-27 ENCOUNTER — Encounter: Payer: Self-pay | Admitting: Family Medicine

## 2018-04-27 ENCOUNTER — Other Ambulatory Visit: Payer: Self-pay

## 2018-04-27 ENCOUNTER — Ambulatory Visit (INDEPENDENT_AMBULATORY_CARE_PROVIDER_SITE_OTHER): Payer: Medicaid Other | Admitting: Family Medicine

## 2018-04-27 VITALS — BP 99/60 | HR 85 | Temp 98.4°F | Wt 201.0 lb

## 2018-04-27 DIAGNOSIS — Z3403 Encounter for supervision of normal first pregnancy, third trimester: Secondary | ICD-10-CM

## 2018-04-27 NOTE — Progress Notes (Signed)
Taylor Carson is a 27 y.o. G2P0010 at 2159w2d here for routine follow up.  She reports that she had an episode of decreased FM last week and was seen at MAU with reactive NST. See flow sheet for details.  A/P: Pregnancy at 4259w2d.  Doing well.   Pregnancy issues include depression. Did not get to behavioral health, they did not call her. She feels things are going much better, no more anhedonia. PHQ 9 5, GAD 7 9.   Infant feeding choice: breastfeeding  Contraception choice: depo [facility]  Tdap was not given today. Given 2 visits ago.   Depression - continue to monitor, especially closely post partum.   Preterm labor and fetal movement precautions reviewed. Safe sleep discussed. Follow up 2 weeks.

## 2018-04-27 NOTE — Patient Instructions (Signed)
The breastfeeding book is called The Womanly Art of Breastfeeding by [facility]   Third Trimester of Pregnancy The third trimester is from week 29 through week 42, months 7 through 9. This trimester is when your unborn baby (fetus) is growing very fast. At the end of the ninth month, the unborn baby is about 20 inches in length. It weighs about 6-10 pounds. Follow these instructions at home:  Avoid all smoking, herbs, and alcohol. Avoid drugs not approved by your doctor.  Do not use any tobacco products, including cigarettes, chewing tobacco, and electronic cigarettes. If you need help quitting, ask your doctor. You may get counseling or other support to help you quit.  Only take medicine as told by your doctor. Some medicines are safe and some are not during pregnancy.  Exercise only as told by your doctor. Stop exercising if you start having cramps.  Eat regular, healthy meals.  Wear a good support bra if your breasts are tender.  Do not use hot tubs, steam rooms, or saunas.  Wear your seat belt when driving.  Avoid raw meat, uncooked cheese, and liter boxes and soil used by cats.  Take your prenatal vitamins.  Take 1500-2000 milligrams of calcium daily starting at the 20th week of pregnancy until you deliver your baby.  Try taking medicine that helps you poop (stool softener) as needed, and if your doctor approves. Eat more fiber by eating fresh fruit, vegetables, and whole grains. Drink enough fluids to keep your pee (urine) clear or pale yellow.  Take warm water baths (sitz baths) to soothe pain or discomfort caused by hemorrhoids. Use hemorrhoid cream if your doctor approves.  If you have puffy, bulging veins (varicose veins), wear support hose. Raise (elevate) your feet for 15 minutes, 3-4 times a day. Limit salt in your diet.  Avoid heavy lifting, wear low heels, and sit up straight.  Rest with your legs raised if you have leg cramps or low back pain.  Visit your  dentist if you have not gone during your pregnancy. Use a soft toothbrush to brush your teeth. Be gentle when you floss.  You can have sex (intercourse) unless your doctor tells you not to.  Do not travel far distances unless you must. Only do so with your doctor's approval.  Take prenatal classes.  Practice driving to the hospital.  Pack your hospital bag.  Prepare the baby's room.  Go to your doctor visits. Get help if:  You are not sure if you are in labor or if your water has broken.  You are dizzy.  You have mild cramps or pressure in your lower belly (abdominal).  You have a nagging pain in your belly area.  You continue to feel sick to your stomach (nauseous), throw up (vomit), or have watery poop (diarrhea).  You have bad smelling fluid coming from your vagina.  You have pain with peeing (urination). Get help right away if:  You have a fever.  You are leaking fluid from your vagina.  You are spotting or bleeding from your vagina.  You have severe belly cramping or pain.  You lose or gain weight rapidly.  You have trouble catching your breath and have chest pain.  You notice sudden or extreme puffiness (swelling) of your face, hands, ankles, feet, or legs.  You have not felt the baby move in over an hour.  You have severe headaches that do not go away with medicine.  You have vision changes. This  information is not intended to replace advice given to you by your health care provider. Make sure you discuss any questions you have with your health care provider. Document Released: 12/23/2009 Document Revised: 03/05/2016 Document Reviewed: 11/29/2012 Elsevier Interactive Patient Education  2017 [employer]

## 2018-05-01 IMAGING — US US MFM OB LIMITED
1 series · 15 of 18 positions shown · non-contrast
Comparison: none

[Series 1: us mfm ob limited · 15 of 18 slices shown]
[im 1/18]
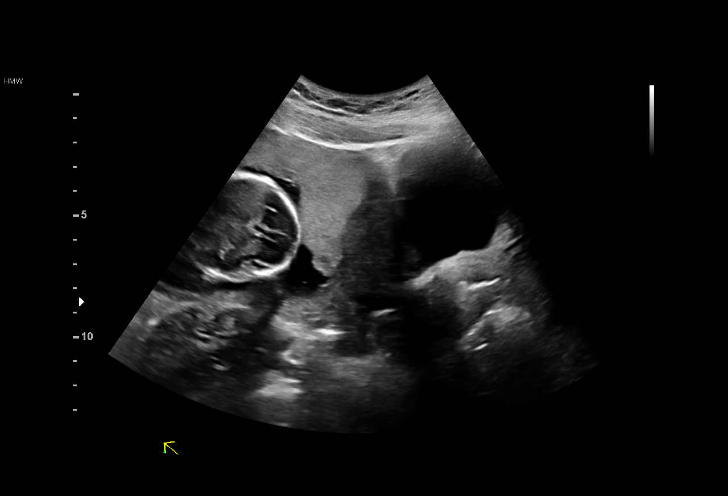
[im 2/18]
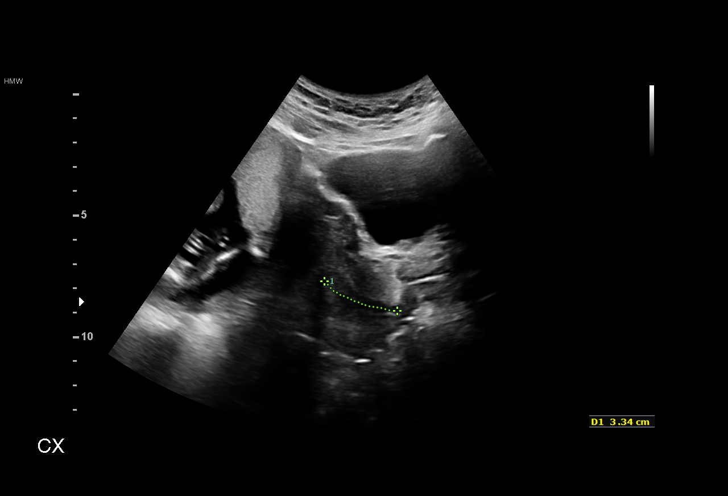
[im 4/18]
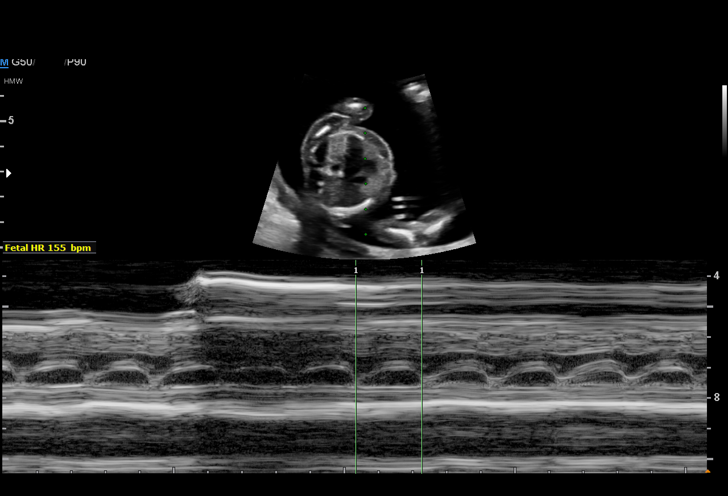
[im 5/18]
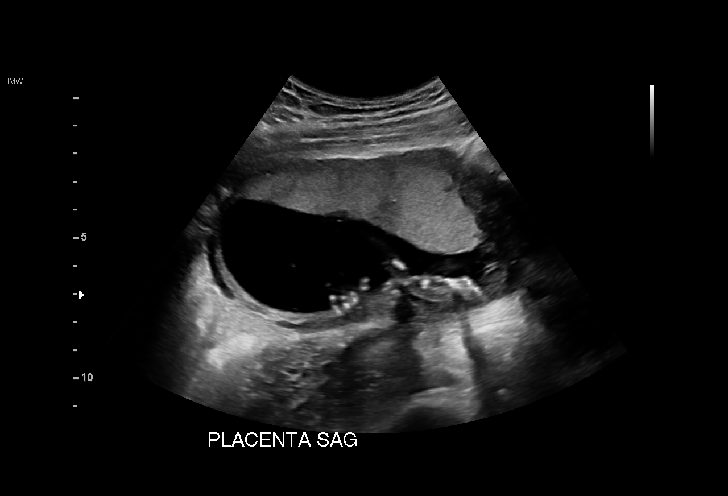
[im 6/18]
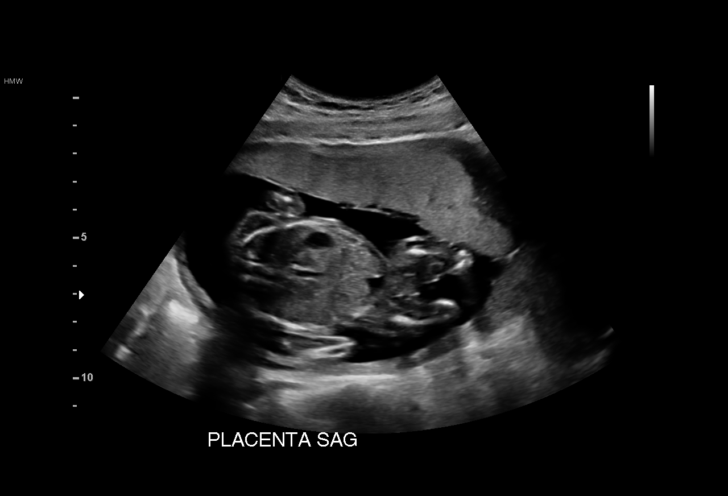
[im 7/18]
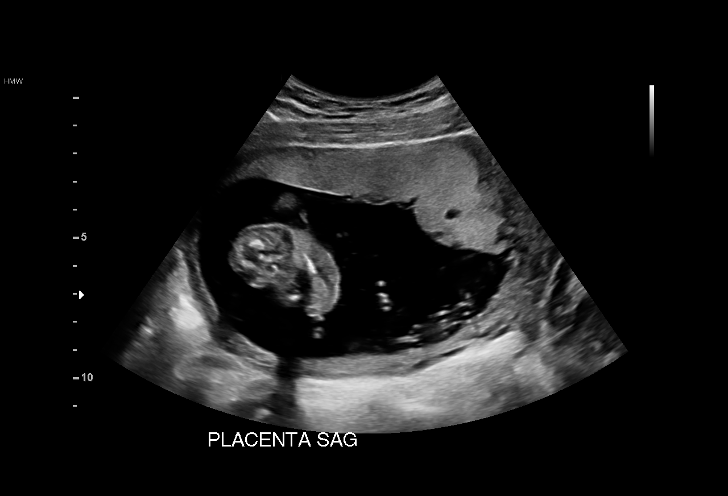
[im 8/18]
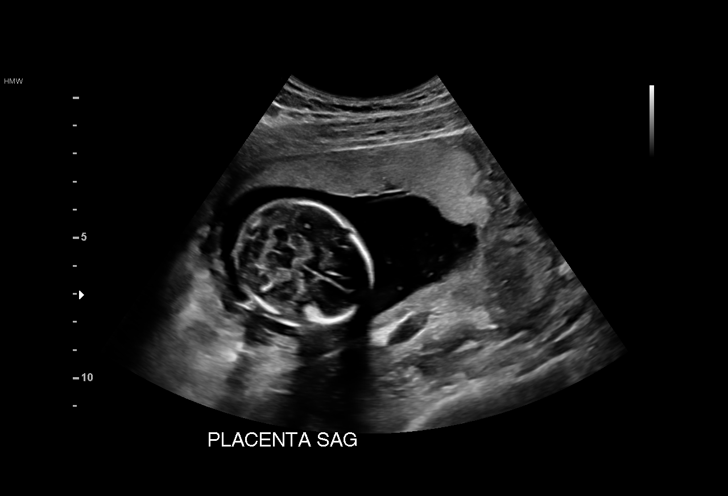
[im 10/18]
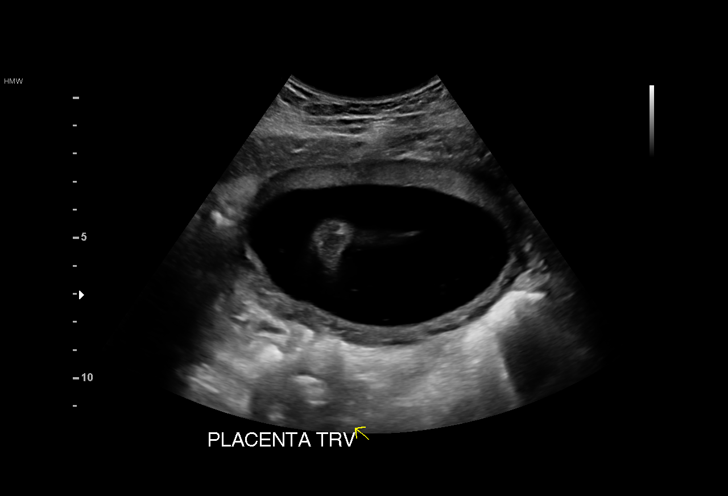
[im 11/18]
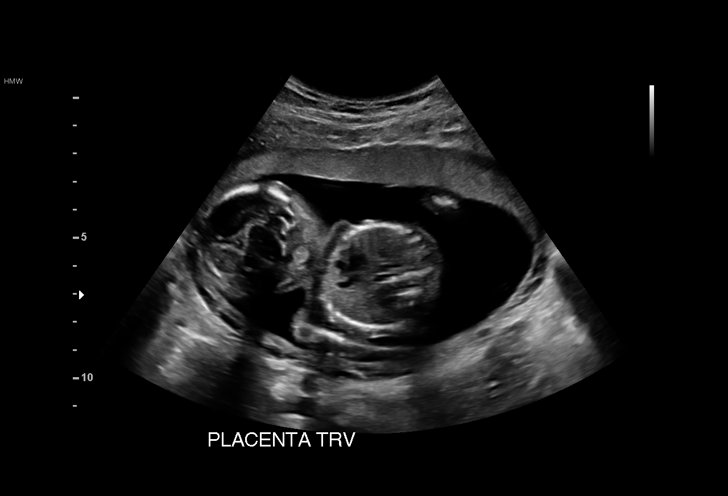
[im 12/18]
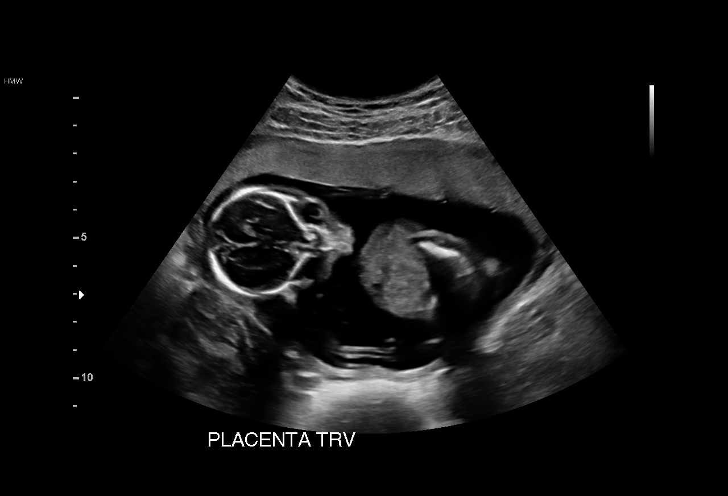
[im 13/18]
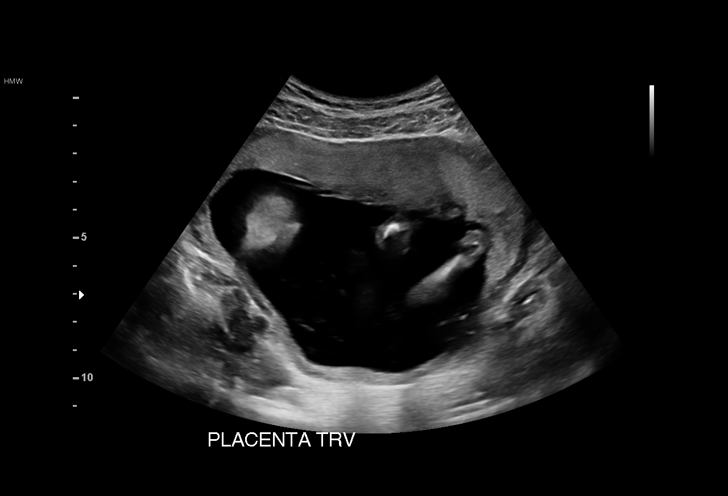
[im 14/18]
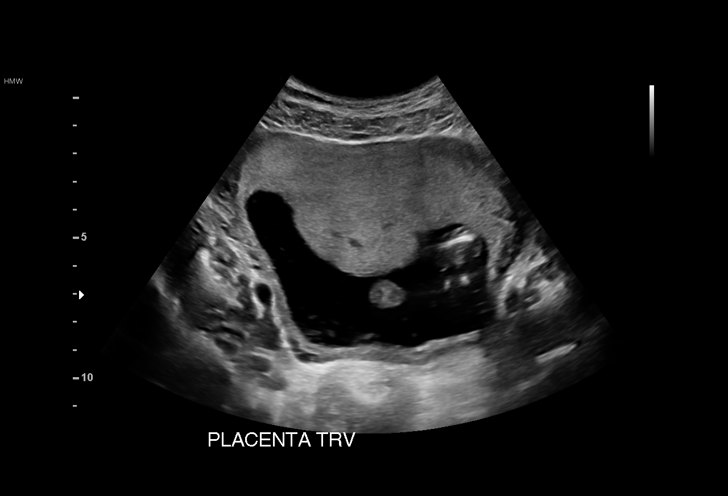
[im 16/18]
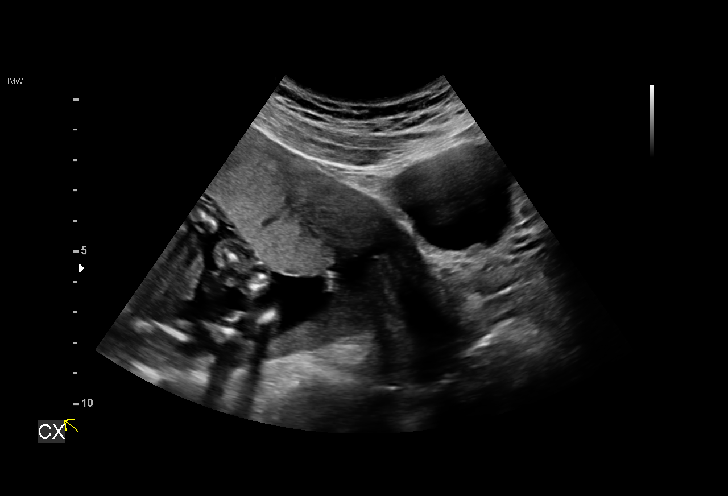
[im 17/18]
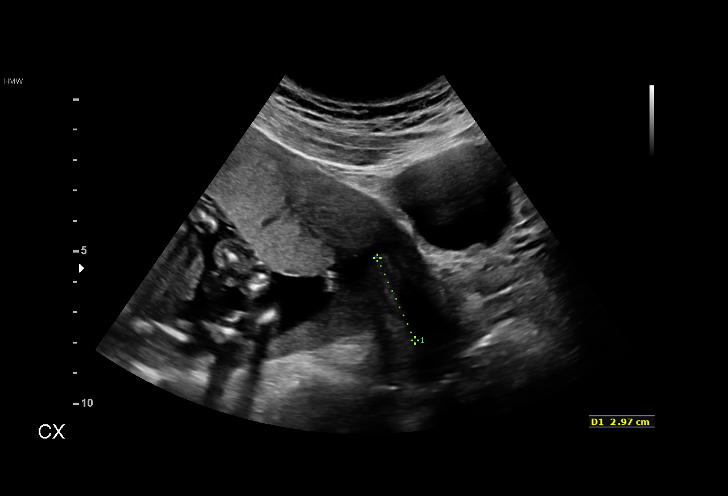
[im 18/18]
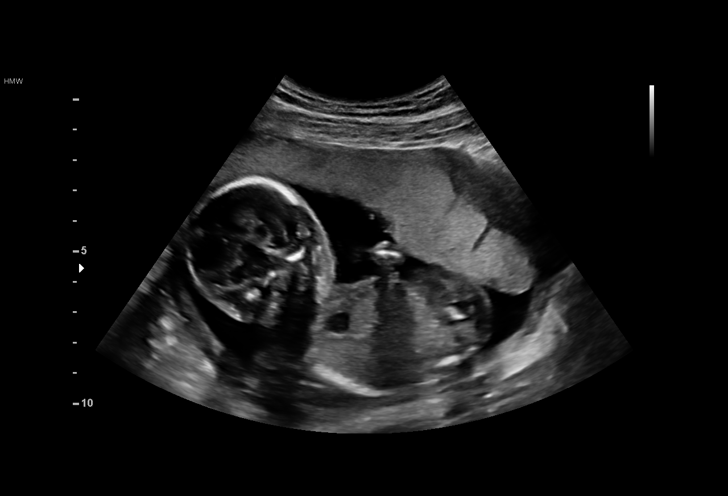

[15 of 18 positions shown; findings below may reference images not displayed]

1  YECENIA MCENTEE           [id number]      [id number]     [id number]
Indications

17 weeks gestation of pregnancy
Abdominal pain in pregnancy
OB History

Gravidity:    2         Term:   0        Prem:   0        SAB:   0
TOP:          0       Ectopic:  1        Living: 0
Fetal Evaluation

Num Of Fetuses:     1
Fetal Heart         155
Rate(bpm):
Cardiac Activity:   Observed
Presentation:       Cephalic
Placenta:           Anterior, above cervical os
P. Cord Insertion:  Visualized, central

Amniotic Fluid
AFI FV:      Subjectively within normal limits

Largest Pocket(cm)
4.5
Gestational Age

LMP:           17w 4d        Date:  09/13/17                 EDD:   06/20/18
Best:          17w 4d     Det. By:  LMP  (09/13/17)          EDD:   06/20/18
Cervix Uterus Adnexa

Cervix
Length:            3.3  cm.
Normal appearance by transabdominal scan.
Uterus
No abnormality visualized.

Left Ovary
No adnexal mass visualized.

Right Ovary
No adnexal mass visualized.

Cul De Sac:   No free fluid seen.

Adnexa:       No abnormality visualized.
Impression

Singleton intrauterine pregnancy at 17+4 weeks with
abdominal pain here for requested limited exam
Normal fetal movement and cardiac activity
Cephalic presentation
Anterior placenta without markers for abruption
Amniotic fluid volume is normal
Adnexa are grossly normal
Recommendations

Continue clinical evaluation and management.

## 2018-05-11 ENCOUNTER — Encounter: Payer: Medicaid Other | Admitting: Family Medicine

## 2018-05-13 ENCOUNTER — Encounter: Payer: Medicaid Other | Admitting: Family Medicine

## 2018-05-18 ENCOUNTER — Other Ambulatory Visit: Payer: Self-pay

## 2018-05-18 ENCOUNTER — Other Ambulatory Visit (HOSPITAL_COMMUNITY)
Admission: RE | Admit: 2018-05-18 | Discharge: 2018-05-18 | Disposition: A | Discharge status: Home / Self Care | Payer: Medicaid Other | Source: Ambulatory Visit | Attending: Family Medicine | Admitting: Family Medicine

## 2018-05-18 ENCOUNTER — Ambulatory Visit (INDEPENDENT_AMBULATORY_CARE_PROVIDER_SITE_OTHER): Payer: Medicaid Other | Admitting: Family Medicine

## 2018-05-18 VITALS — BP 100/70 | HR 78 | Wt 204.0 lb

## 2018-05-18 DIAGNOSIS — Z3403 Encounter for supervision of normal first pregnancy, third trimester: Secondary | ICD-10-CM | POA: Diagnosis not present

## 2018-05-18 NOTE — Progress Notes (Signed)
   PRENATAL VISIT NOTE  Subjective:  Taylor Carson is a 27 y.o. G2P0010 at 712w2d being seen today for ongoing prenatal care.  She is currently monitored for the following issues for this low-risk pregnancy and has Irregular menses; Nicotine dependence; Enlarged thyroid gland; Depression; Supervision of normal first pregnancy; Obesity affecting pregnancy in second trimester; Smoking (tobacco) complicating pregnancy, second trimester; and Shortness of breath on their problem list.  Patient reports no complaints.  Contractions: Irritability.  .  Movement: Present. Denies leaking of fluid.   The following portions of the patient's history were reviewed and updated as appropriate: allergies, current medications, past family history, past medical history, past social history, past surgical history and problem list. Problem list updated.  Objective:   Vitals:   05/18/18 1505  BP: 100/70  Pulse: 78  Weight: 204 lb (92.5 kg)    Fetal Status: Fetal Heart Rate (bpm): 145 Fundal Height: 33 cm Movement: Present  Presentation: Vertex  General:  Alert, oriented and cooperative. Patient is in no acute distress.  Skin: Skin is warm and dry. No rash noted.   Cardiovascular: Normal heart rate noted  Respiratory: Normal respiratory effort, no problems with respiration noted  Abdomen: Soft, gravid, appropriate for gestational age.  Pain/Pressure: Absent     Pelvic: Cervical exam performed Dilation: Closed Effacement (%): 50 Station: -3  Extremities: Normal range of motion.     Mental Status: Normal mood and affect. Normal behavior. Normal judgment and thought content.   Assessment and Plan:  Pregnancy: G2P0010 at 522w2d  1. Encounter for supervision of normal first pregnancy in third trimester Continue routine prenatal care.  - Culture, beta strep (group b only) - Cervicovaginal ancillary only  Preterm labor symptoms and general obstetric precautions including but not limited to vaginal  bleeding, contractions, leaking of fluid and fetal movement were reviewed in detail with the patient. Please refer to After Visit Summary for other counseling recommendations.  Return in 1 week (on 05/25/2018).  Future Appointments  Date Time Provider Department Center  05/27/2018  4:00 PM Winfrey, Harlen Labs[facility]manda C[facility]    Reva Boresanya S Kieley Akter, MD

## 2018-05-18 NOTE — Patient Instructions (Addendum)
Breastfeeding Choosing to breastfeed is one of the best decisions you can make for yourself and your baby. A change in hormones during pregnancy causes your breasts to make breast milk in your milk-producing glands. Hormones prevent breast milk from being released before your baby is born. They also prompt milk flow after birth. Once breastfeeding has begun, thoughts of your baby, as well as his or her sucking or crying, can stimulate the release of milk from your milk-producing glands. Benefits of breastfeeding Research shows that breastfeeding offers many health benefits for infants and mothers. It also offers a cost-free and convenient way to feed your baby. For your baby  Your first milk (colostrum) helps your baby's digestive system to function better.  Special cells in your milk (antibodies) help your baby to fight off infections.  Breastfed babies are less likely to develop asthma, allergies, obesity, or type 2 diabetes. They are also at lower risk for sudden infant death syndrome (SIDS).  Nutrients in breast milk are better able to meet your baby's needs compared to infant formula.  Breast milk improves your baby's brain development. For you  Breastfeeding helps to create a very special bond between you and your baby.  Breastfeeding is convenient. Breast milk costs nothing and is always available at the correct temperature.  Breastfeeding helps to burn calories. It helps you to lose the weight that you gained during pregnancy.  Breastfeeding makes your uterus return faster to its size before pregnancy. It also slows bleeding (lochia) after you give birth.  Breastfeeding helps to lower your risk of developing type 2 diabetes, osteoporosis, rheumatoid arthritis, cardiovascular disease, and breast, ovarian, uterine, and endometrial cancer later in life. Breastfeeding basics Starting breastfeeding  Find a comfortable place to sit or lie down, with your neck and back  well-supported.  Place a pillow or a rolled-up blanket under your baby to bring him or her to the level of your breast (if you are seated). Nursing pillows are specially designed to help support your arms and your baby while you breastfeed.  Make sure that your baby's tummy (abdomen) is facing your abdomen.  Gently massage your breast. With your fingertips, massage from the outer edges of your breast inward toward the nipple. This encourages milk flow. If your milk flows slowly, you may need to continue this action during the feeding.  Support your breast with 4 fingers underneath and your thumb above your nipple (make the letter "C" with your hand). Make sure your fingers are well away from your nipple and your baby's mouth.  Stroke your baby's lips gently with your finger or nipple.  When your baby's mouth is open wide enough, quickly bring your baby to your breast, placing your entire nipple and as much of the areola as possible into your baby's mouth. The areola is the colored area around your nipple. ? More areola should be visible above your baby's upper lip than below the lower lip. ? Your baby's lips should be opened and extended outward (flanged) to ensure an adequate, comfortable latch. ? Your baby's tongue should be between his or her lower gum and your breast.  Make sure that your baby's mouth is correctly positioned around your nipple (latched). Your baby's lips should create a seal on your breast and be turned out (everted).  It is common for your baby to suck about 2-3 minutes in order to start the flow of breast milk. Latching Teaching your baby how to latch onto your breast properly is  very important. An improper latch can cause nipple pain, decreased milk supply, and poor weight gain in your baby. Also, if your baby is not latched onto your nipple properly, he or she may swallow some air during feeding. This can make your baby fussy. Burping your baby when you switch breasts  during the feeding can help to get rid of the air. However, teaching your baby to latch on properly is still the best way to prevent fussiness from swallowing air while breastfeeding. Signs that your baby has successfully latched onto your nipple  Silent tugging or silent sucking, without causing you pain. Infant's lips should be extended outward (flanged).  Swallowing heard between every 3-4 sucks once your milk has started to flow (after your let-down milk reflex occurs).  Muscle movement above and in front of his or her ears while sucking.  Signs that your baby has not successfully latched onto your nipple  Sucking sounds or smacking sounds from your baby while breastfeeding.  Nipple pain.  If you think your baby has not latched on correctly, slip your finger into the corner of your baby's mouth to break the suction and place it between your baby's gums. Attempt to start breastfeeding again. Signs of successful breastfeeding Signs from your baby  Your baby will gradually decrease the number of sucks or will completely stop sucking.  Your baby will fall asleep.  Your baby's body will relax.  Your baby will retain a small amount of milk in his or her mouth.  Your baby will let go of your breast by himself or herself.  Signs from you  Breasts that have increased in firmness, weight, and size 1-3 hours after feeding.  Breasts that are softer immediately after breastfeeding.  Increased milk volume, as well as a change in milk consistency and color by the fifth day of breastfeeding.  Nipples that are not sore, cracked, or bleeding.  Signs that your baby is getting enough milk  Wetting at least 1-2 diapers during the first 24 hours after birth.  Wetting at least 5-6 diapers every 24 hours for the first week after birth. The urine should be clear or pale yellow by the age of 5 days.  Wetting 6-8 diapers every 24 hours as your baby continues to grow and develop.  At least 3  stools in a 24-hour period by the age of 5 days. The stool should be soft and yellow.  At least 3 stools in a 24-hour period by the age of 7 days. The stool should be seedy and yellow.  No loss of weight greater than 10% of birth weight during the first 3 days of life.  Average weight gain of 4-7 oz (113-198 g) per week after the age of 4 days.  Consistent daily weight gain by the age of 5 days, without weight loss after the age of 2 weeks. After a feeding, your baby may spit up a small amount of milk. This is normal. Breastfeeding frequency and duration Frequent feeding will help you make more milk and can prevent sore nipples and extremely full breasts (breast engorgement). Breastfeed when you feel the need to reduce the fullness of your breasts or when your baby shows signs of hunger. This is called "breastfeeding on demand." Signs that your baby is hungry include:  Increased alertness, activity, or restlessness.  Movement of the head from side to side.  Opening of the mouth when the corner of the mouth or cheek is stroked (rooting).  Increased sucking  sounds, smacking lips, cooing, sighing, or squeaking.  Hand-to-mouth movements and sucking on fingers or hands.  Fussing or crying.  Avoid introducing a pacifier to your baby in the first 4-6 weeks after your baby is born. After this time, you may choose to use a pacifier. Research has shown that pacifier use during the first year of a baby's life decreases the risk of sudden infant death syndrome (SIDS). Allow your baby to feed on each breast as long as he or she wants. When your baby unlatches or falls asleep while feeding from the first breast, offer the second breast. Because newborns are often sleepy in the first few weeks of life, you may need to awaken your baby to get him or her to feed. Breastfeeding times will vary from baby to baby. However, the following rules can serve as a guide to help you make sure that your baby is  properly fed:  Newborns (babies 22 weeks of age or younger) may breastfeed every 1-3 hours.  Newborns should not go without breastfeeding for longer than 3 hours during the day or 5 hours during the night.  You should breastfeed your baby a minimum of 8 times in a 24-hour period.  Breast milk pumping Pumping and storing breast milk allows you to make sure that your baby is exclusively fed your breast milk, even at times when you are unable to breastfeed. This is especially important if you go back to work while you are still breastfeeding, or if you are not able to be present during feedings. Your lactation consultant can help you find a method of pumping that works best for you and give you guidelines about how long it is safe to store breast milk. Caring for your breasts while you breastfeed Nipples can become dry, cracked, and sore while breastfeeding. The following recommendations can help keep your breasts moisturized and healthy:  Avoid using soap on your nipples.  Wear a supportive bra designed especially for nursing. Avoid wearing underwire-style bras or extremely tight bras (sports bras).  Air-dry your nipples for 3-4 minutes after each feeding.  Use only cotton bra pads to absorb leaked breast milk. Leaking of breast milk between feedings is normal.  Use lanolin on your nipples after breastfeeding. Lanolin helps to maintain your skin's normal moisture barrier. Pure lanolin is not harmful (not toxic) to your baby. You may also hand express a few drops of breast milk and gently massage that milk into your nipples and allow the milk to air-dry.  In the first few weeks after giving birth, some women experience breast engorgement. Engorgement can make your breasts feel heavy, warm, and tender to the touch. Engorgement peaks within 3-5 days after you give birth. The following recommendations can help to ease engorgement:  Completely empty your breasts while breastfeeding or pumping. You  may want to start by applying warm, moist heat (in the shower or with warm, water-soaked hand towels) just before feeding or pumping. This increases circulation and helps the milk flow. If your baby does not completely empty your breasts while breastfeeding, pump any extra milk after he or she is finished.  Apply ice packs to your breasts immediately after breastfeeding or pumping, unless this is too uncomfortable for you. To do this: ? Put ice in a plastic bag. ? Place a towel between your skin and the bag. ? Leave the ice on for 20 minutes, 2-3 times a day.  Make sure that your baby is latched on and positioned properly  while breastfeeding.  If engorgement persists after 48 hours of following these recommendations, contact your health care provider or a Science writer. Overall health care recommendations while breastfeeding  Eat 3 healthy meals and 3 snacks every day. Well-nourished mothers who are breastfeeding need an additional 450-500 calories a day. You can meet this requirement by increasing the amount of a balanced diet that you eat.  Drink enough water to keep your urine pale yellow or clear.  Rest often, relax, and continue to take your prenatal vitamins to prevent fatigue, stress, and low vitamin and mineral levels in your body (nutrient deficiencies).  Do not use any products that contain nicotine or tobacco, such as cigarettes and e-cigarettes. Your baby may be harmed by chemicals from cigarettes that pass into breast milk and exposure to secondhand smoke. If you need help quitting, ask your health care provider.  Avoid alcohol.  Do not use illegal drugs or marijuana.  Talk with your health care provider before taking any medicines. These include over-the-counter and prescription medicines as well as vitamins and herbal supplements. Some medicines that may be harmful to your baby can pass through breast milk.  It is possible to become pregnant while breastfeeding. If  birth control is desired, ask your health care provider about options that will be safe while breastfeeding your baby. Where to find more information: [employer]: www.llli.org Contact a health care provider if:  You feel like you want to stop breastfeeding or have become frustrated with breastfeeding.  Your nipples are cracked or bleeding.  Your breasts are red, tender, or warm.  You have: ? Painful breasts or nipples. ? A swollen area on either breast. ? A fever or chills. ? Nausea or vomiting. ? Drainage other than breast milk from your nipples.  Your breasts do not become full before feedings by the fifth day after you give birth.  You feel sad and depressed.  Your baby is: ? Too sleepy to eat well. ? Having trouble sleeping. ? More than 92 week old and wetting fewer than 6 diapers in a 24-hour period. ? Not gaining weight by 57 days of age.  Your baby has fewer than 3 stools in a 24-hour period.  Your baby's skin or the white parts of his or her eyes become yellow. Get help right away if:  Your baby is overly tired (lethargic) and does not want to wake up and feed.  Your baby develops an unexplained fever. Summary  Breastfeeding offers many health benefits for infant and mothers.  Try to breastfeed your infant when he or she shows early signs of hunger.  Gently tickle or stroke your baby's lips with your finger or nipple to allow the baby to open his or her mouth. Bring the baby to your breast. Make sure that much of the areola is in your baby's mouth. Offer one side and burp the baby before you offer the other side.  Talk with your health care provider or lactation consultant if you have questions or you face problems as you breastfeed. This information is not intended to replace advice given to you by your health care provider. Make sure you discuss any questions you have with your health care provider. Document Released: 09/28/2005 Document  Revised: 10/30/2016 Document Reviewed: 10/30/2016 Elsevier Interactive Patient Education  2018 [place]? Guide for patients at [facility] [employer] [facility]  Why consider waterbirth?  . Gentle birth for babies . Less pain medicine used in labor .  May allow for passive descent/less pushing . May reduce perineal tears  . More mobility and instinctive maternal position changes . Increased maternal relaxation . Reduced blood pressure in labor  Is waterbirth safe? What are the risks of infection, drowning or other complications?  . Infection: o Very low risk (3.7 % for tub vs 4.8% for bed) o 7 in 8000 waterbirths with documented infection o Poorly cleaned equipment most common cause o Slightly lower group B strep transmission rate  . Drowning o Maternal:  - Very low risk   - Related to seizures or fainting o Newborn:  - Very low risk. No evidence of increased risk of respiratory problems in multiple large studies - Physiological protection from breathing under water - Avoid underwater birth if there are any fetal complications - Once baby's head is out of the water, keep it out.  . Birth complication o Some reports of cord trauma, but risk decreased by bringing baby to surface gradually o No evidence of increased risk of shoulder dystocia. Mothers can usually change positions faster in water than in a bed, possibly aiding the maneuvers to free the shoulder.   You must attend a Doren Custard class at [facility]  3rd Wednesday of every month from 7-9pm  [employer] by calling [phone] or online at [email or web address]  Bring [place] the certificate from the class to your prenatal appointment  Meet with a midwife at 36 weeks to see if you can still plan a waterbirth and to sign the consent.   Purchase or rent the following supplies:   Water Birth Pool (Birth Pool in a Box or [place] for instance)  (Tubs start ~$125)  Single-use  disposable tub liner designed for your brand of tub  New garden hose labeled "lead-free", "suitable for drinking water",  Electric drain pump to remove water (We recommend 792 gallon per hour or greater pump.)   Separate garden hose to remove the dirty water  Fish net  Bathing suit top (optional)  Long-handled mirror (optional)  Places to purchase or rent supplies  GotWeb[employer].is for tub purchases and supplies  Waterbirthsolutions.com for tub purchases and supplies  [employer] (www.thelaborladies.com) $275 for tub rental/set-up & take down/kit   [employer] (http://www.fleming.com/.htm) Information regarding doulas (labor support) who provide pool rentals  Our practice has a Birth Pool in a Box tub at the hospital that you may borrow on a first-come-first-served basis. It is your responsibility to to set up, clean and break down the tub. We cannot guarantee the availability of this tub in advance. You are responsible for bringing all accessories listed above. If you do not have all necessary supplies you cannot have a waterbirth.    Things that would prevent you from having a waterbirth:  Premature, <37wks  Previous cesarean birth  Presence of thick meconium-stained fluid  Multiple gestation (Twins, triplets, etc.)  Uncontrolled diabetes or gestational diabetes requiring medication  Hypertension requiring medication or diagnosis of pre-eclampsia  Heavy vaginal bleeding  Non-reassuring fetal heart rate  Active infection (MRSA, etc.). Group B Strep is NOT a contraindication for  waterbirth.  If your labor has to be induced and induction method requires continuous  monitoring of the baby's heart rate  Other risks/issues identified by your obstetrical provider  Please remember that birth is unpredictable. Under certain unforeseeable circumstances your provider may advise against giving birth in the tub. These decisions will be made on a  case-by-case basis and with the safety of you and  your baby as our highest priority.

## 2018-05-19 LAB — CERVICOVAGINAL ANCILLARY ONLY
Chlamydia: NEGATIVE
Neisseria Gonorrhea: NEGATIVE

## 2018-05-21 LAB — CULTURE, BETA STREP (GROUP B ONLY): Strep Gp B Culture: POSITIVE — AB

## 2018-05-27 ENCOUNTER — Ambulatory Visit (INDEPENDENT_AMBULATORY_CARE_PROVIDER_SITE_OTHER): Payer: Medicaid Other | Admitting: Family Medicine

## 2018-05-27 ENCOUNTER — Encounter: Payer: Self-pay | Admitting: Family Medicine

## 2018-05-27 ENCOUNTER — Other Ambulatory Visit: Payer: Self-pay

## 2018-05-27 VITALS — BP 100/60 | HR 82 | Temp 98.4°F | Wt 204.8 lb

## 2018-05-27 DIAGNOSIS — Z3403 Encounter for supervision of normal first pregnancy, third trimester: Secondary | ICD-10-CM | POA: Diagnosis not present

## 2018-05-27 NOTE — Progress Notes (Signed)
Taylor Carson is a 27 y.o. G2P0 at 568w4d for routine follow up.  She reports no contractions, no vaginal bleeding, no loss of fluid, and she is feeling her baby move.  See flow sheet for details.  Vitals:   05/27/18 1637  BP: 100/60  Pulse: 82  Temp: 98.4 F (36.9 C)  FHT: 140 bpm Fundal height: 34 cm Vertex presentation by Thayer OhmLeopold maneuver Cardiac: Regular rate and rhythm, no murmurs Pulmonary: Clear to auscultation bilaterally, no respiratory distress  A/P: Pregnancy at 628w4d.  Doing well.   Pregnancy issues include none.  Infant feeding choice: breast-feeding.  Patient has attended a breast-feeding class.  We discussed the benefits of breast-feeding today. Contraception choice: Depo-Provera. Infant circumcision desired yes GBS/GC/CZ results were reviewed today.   Labor precautions reviewed. Return in 1 week.

## 2018-06-03 ENCOUNTER — Ambulatory Visit (INDEPENDENT_AMBULATORY_CARE_PROVIDER_SITE_OTHER): Payer: Medicaid Other | Admitting: Family Medicine

## 2018-06-03 ENCOUNTER — Other Ambulatory Visit: Payer: Self-pay

## 2018-06-03 VITALS — BP 90/58 | HR 77 | Temp 98.5°F | Wt 204.4 lb

## 2018-06-03 DIAGNOSIS — Z3403 Encounter for supervision of normal first pregnancy, third trimester: Secondary | ICD-10-CM

## 2018-06-03 NOTE — Progress Notes (Signed)
Taylor Carson is a 27 y.o. G2P0 at 4351w4d for routine follow up.  She reports no vaginal bleeding, loss of fluid, or contraction, and reports fetal movement.  She reports some Braxton Hicks contractions in the past week and feeling some increased pelvic pressure at times.   See flow sheet for details.  General: alert, pleasant, appears comfortable FHT: 132 bpm Fundal Height: 37 cm  A/P: Pregnancy at 5451w4d.  Doing well.   Pregnancy issues include none.  Infant feeding choice: breast Contraception choice: depo provera Infant circumcision desired: yes, outpatient GBS/GC/CZ results were reviewed today.   Patient counseled on what to expect during labor and delivery as well as for breastfeeding (milk often doesn't come in for up to 5 days, nutrients in colostrum, frequent latching helps with supply) Labor precautions reviewed. Kick counts reviewed.

## 2018-06-03 NOTE — Patient Instructions (Addendum)
It was nice seeing you today Ms. Spurling!  Your exam looks completely normal.    Please return for your next appointment in one week.  If you have any questions or concerns, please feel free to call the clinic.   Be well,  Dr. Frances FurbishWinfrey

## 2018-06-10 ENCOUNTER — Other Ambulatory Visit: Payer: Self-pay

## 2018-06-10 ENCOUNTER — Ambulatory Visit (INDEPENDENT_AMBULATORY_CARE_PROVIDER_SITE_OTHER): Payer: Medicaid Other | Admitting: Family Medicine

## 2018-06-10 ENCOUNTER — Encounter: Payer: Self-pay | Admitting: Family Medicine

## 2018-06-10 VITALS — BP 102/64 | HR 80 | Temp 98.3°F | Wt 207.6 lb

## 2018-06-10 DIAGNOSIS — Z3403 Encounter for supervision of normal first pregnancy, third trimester: Secondary | ICD-10-CM

## 2018-06-10 NOTE — Progress Notes (Signed)
Christiana Glori Luis Clendenning is a 27 y.o. G2P0 at 6760w4d for routine follow up.  She reports no vaginal bleeding, no contractions, and positive fetal movement.  She does say that she noticed a little bit of fluid leaking that was clear earlier today.  It did not soak her underwear and has since stopped.  It did not appear like vaginal discharge and was watery rather than mucousy.  See flow sheet for details.  FHT: 136 Fundal height: 35 cm Digital cervical exam: 2/20/ballotable  A/P: Pregnancy at 6160w4d.  Doing well.  Amniotic sac intact. Pregnancy issues include none.    Infant feeding choice breast Contraception choice depo provera Infant circumcision desired yes, outpatient GBS/GC/CZ results were reviewed today.  GBS positive. Labor precautions reviewed. Kick counts reviewed.

## 2018-06-20 ENCOUNTER — Encounter (HOSPITAL_COMMUNITY): Payer: Self-pay | Admitting: *Deleted

## 2018-06-20 ENCOUNTER — Ambulatory Visit (INDEPENDENT_AMBULATORY_CARE_PROVIDER_SITE_OTHER): Payer: Medicaid Other | Admitting: Family Medicine

## 2018-06-20 ENCOUNTER — Other Ambulatory Visit: Payer: Self-pay

## 2018-06-20 ENCOUNTER — Telehealth (HOSPITAL_COMMUNITY): Payer: Self-pay | Admitting: *Deleted

## 2018-06-20 VITALS — BP 122/80 | HR 71 | Temp 98.1°F | Wt 211.4 lb

## 2018-06-20 DIAGNOSIS — Z3483 Encounter for supervision of other normal pregnancy, third trimester: Secondary | ICD-10-CM | POA: Diagnosis not present

## 2018-06-20 DIAGNOSIS — Z23 Encounter for immunization: Secondary | ICD-10-CM | POA: Diagnosis not present

## 2018-06-20 NOTE — Progress Notes (Signed)
Taylor Carson is a 27 y.o. G2P0010 at [redacted]w[redacted]d for routine follow up. She is dated by LMP consistent with 17 week ultrasound.  She reports overall she is doing well. She is ready to meet her baby boy.  See flow sheet for details. FHT 125  FH 38 cm Cervix 1/30/-3 Bishop score =3  A/P: Pregnancy at [redacted]w[redacted]d.  Doing well.   Pregnancy issues include Depression, on Sertraline GERD, on ranitidine  Elevated 1 hour GTT, normal 3 hour   Infant feeding choice breastfeeding Contraception choice Depo Infant circumcision desired yes GBS/GC/CZ results  were reviewed today.   Labor precautions reviewed. Kick counts reviewed. Induction scheduled for 41+ weeks on September 16th at 7:30 AM. BPP scheduled for Wednesday. Will need Pap after delivery, discussed today   Terisa Starr, MD  Family Medicine

## 2018-06-20 NOTE — Telephone Encounter (Signed)
Preadmission screen  

## 2018-06-20 NOTE — Patient Instructions (Addendum)
For your fetal testing Wednesday at 2:15 PM---- at [facility]   For your induction of labor-- Monday September 16th at 7:30 AM   Go to [facility] on Monday at 7:30 AM  Be sure to eat breakfast before you go to Labor and Delivery    It was wonderful to see you today.  Thank you for choosing [facility].   Please call [phone] with any questions about today's appointment.  Please be sure to schedule follow up at the front  desk before you leave today.   Terisa Starr, MD  Family Medicine    Labor Induction Labor induction is when steps are taken to cause a pregnant woman to begin the labor process. Most women go into labor on their own between 37 weeks and 42 weeks of the pregnancy. When this does not happen or when there is a medical need, methods may be used to induce labor. Labor induction causes a pregnant woman's uterus to contract. It also causes the cervix to soften (ripen), open (dilate), and thin out (efface). Usually, labor is not induced before 39 weeks of the pregnancy unless there is a problem with the baby or mother. Before inducing labor, your health care provider will consider a number of factors, including the following:  The medical condition of you and the baby.  How many weeks along you are.  The status of the baby's lung maturity.  The condition of the cervix.  The position of the baby.  What are the reasons for labor induction? Labor may be induced for the following reasons:  The health of the baby or mother is at risk.  The pregnancy is overdue by 1 week or more.  The water breaks but labor does not start on its own.  The mother has a health condition or serious illness, such as high blood pressure, infection, placental abruption, or diabetes.  The amniotic fluid amounts are low around the baby.  The baby is distressed.  Convenience or wanting the baby to be born on a certain date is not a reason for inducing  labor. What methods are used for labor induction? Several methods of labor induction may be used, such as:  Prostaglandin medicine. This medicine causes the cervix to dilate and ripen. The medicine will also start contractions. It can be taken by mouth or by inserting a suppository into the vagina.  Inserting a thin tube (catheter) with a balloon on the end into the vagina to dilate the cervix. Once inserted, the balloon is expanded with water, which causes the cervix to open.  Stripping the membranes. Your health care provider separates amniotic sac tissue from the cervix, causing the cervix to be stretched and causing the release of a hormone called progesterone. This may cause the uterus to contract. It is often done during an office visit. You will be sent home to wait for the contractions to begin. You will then come in for an induction.  Breaking the water. Your health care provider makes a hole in the amniotic sac using a small instrument. Once the amniotic sac breaks, contractions should begin. This may still take hours to see an effect.  Medicine to trigger or strengthen contractions. This medicine is given through an IV access tube inserted into a vein in your arm.  All of the methods of induction, besides stripping the membranes, will be done in the hospital. Induction is done in the hospital so that you and the baby can be carefully  monitored. How long does it take for labor to be induced? Some inductions can take up to 2-3 days. Depending on the cervix, it usually takes less time. It takes longer when you are induced early in the pregnancy or if this is your first pregnancy. If a mother is still pregnant and the induction has been going on for 2-3 days, either the mother will be sent home or a cesarean delivery will be needed. What are the risks associated with labor induction? Some of the risks of induction include:  Changes in fetal heart rate, such as too high, too low, or  erratic.  Fetal distress.  Chance of infection for the mother and baby.  Increased chance of having a cesarean delivery.  Breaking off (abruption) of the placenta from the uterus (rare).  Uterine rupture (very rare).  When induction is needed for medical reasons, the benefits of induction may outweigh the risks. What are some reasons for not inducing labor? Labor induction should not be done if:  It is shown that your baby does not tolerate labor.  You have had previous surgeries on your uterus, such as a myomectomy or the removal of fibroids.  Your placenta lies very low in the uterus and blocks the opening of the cervix (placenta previa).  Your baby is not in a head-down position.  The umbilical cord drops down into the birth canal in front of the baby. This could cut off the baby's blood and oxygen supply.  You have had a previous cesarean delivery.  There are unusual circumstances, such as the baby being extremely premature.  This information is not intended to replace advice given to you by your health care provider. Make sure you discuss any questions you have with your health care provider. Document Released: 02/17/2007 Document Revised: 03/05/2016 Document Reviewed: 04/27/2013 Elsevier Interactive Patient Education  2017 [employer].

## 2018-06-22 ENCOUNTER — Inpatient Hospital Stay (HOSPITAL_COMMUNITY)
Admission: AD | Admit: 2018-06-22 | Discharge: 2018-06-26 | DRG: 788 | Disposition: A | Discharge status: Home / Self Care | Payer: Medicaid Other | Attending: Obstetrics & Gynecology | Admitting: Obstetrics & Gynecology

## 2018-06-22 ENCOUNTER — Ambulatory Visit: Payer: Self-pay

## 2018-06-22 ENCOUNTER — Encounter (HOSPITAL_COMMUNITY): Payer: Self-pay

## 2018-06-22 ENCOUNTER — Ambulatory Visit (INDEPENDENT_AMBULATORY_CARE_PROVIDER_SITE_OTHER): Payer: Medicaid Other | Admitting: *Deleted

## 2018-06-22 ENCOUNTER — Other Ambulatory Visit: Payer: Self-pay

## 2018-06-22 VITALS — BP 105/71 | HR 77 | Wt 211.6 lb

## 2018-06-22 DIAGNOSIS — O99214 Obesity complicating childbirth: Secondary | ICD-10-CM | POA: Diagnosis present

## 2018-06-22 DIAGNOSIS — Z98891 History of uterine scar from previous surgery: Secondary | ICD-10-CM

## 2018-06-22 DIAGNOSIS — O4292 Full-term premature rupture of membranes, unspecified as to length of time between rupture and onset of labor: Secondary | ICD-10-CM | POA: Diagnosis present

## 2018-06-22 DIAGNOSIS — Z3A40 40 weeks gestation of pregnancy: Secondary | ICD-10-CM

## 2018-06-22 DIAGNOSIS — O480 Post-term pregnancy: Secondary | ICD-10-CM

## 2018-06-22 DIAGNOSIS — O99824 Streptococcus B carrier state complicating childbirth: Secondary | ICD-10-CM | POA: Diagnosis present

## 2018-06-22 DIAGNOSIS — O4103X0 Oligohydramnios, third trimester, not applicable or unspecified: Secondary | ICD-10-CM | POA: Diagnosis not present

## 2018-06-22 DIAGNOSIS — Z87891 Personal history of nicotine dependence: Secondary | ICD-10-CM

## 2018-06-22 DIAGNOSIS — E669 Obesity, unspecified: Secondary | ICD-10-CM | POA: Diagnosis present

## 2018-06-22 DIAGNOSIS — O4100X0 Oligohydramnios, unspecified trimester, not applicable or unspecified: Secondary | ICD-10-CM | POA: Diagnosis present

## 2018-06-22 LAB — CBC
HCT: 38.3 % (ref 36.0–46.0)
Hemoglobin: 13 g/dL (ref 12.0–15.0)
MCH: 29.4 pg (ref 26.0–34.0)
MCHC: 33.9 g/dL (ref 30.0–36.0)
MCV: 86.7 fL (ref 78.0–100.0)
PLATELETS: 191 10*3/uL (ref 150–400)
RBC: 4.42 MIL/uL (ref 3.87–5.11)
RDW: 13.5 % (ref 11.5–15.5)
WBC: 9.5 10*3/uL (ref 4.0–10.5)

## 2018-06-22 LAB — TYPE AND SCREEN
ABO/RH(D): A POS
ANTIBODY SCREEN: NEGATIVE

## 2018-06-22 MED ORDER — ONDANSETRON HCL 4 MG/2ML IJ SOLN
4.0000 mg | Freq: Four times a day (QID) | INTRAMUSCULAR | Status: DC | PRN
  Administered 2018-06-24: 4 mg via INTRAVENOUS
  Filled 2018-06-22 (×2): qty 2

## 2018-06-22 MED ORDER — LIDOCAINE HCL (PF) 1 % IJ SOLN: 30.0000 mL | INTRAMUSCULAR | Status: DC | PRN

## 2018-06-22 MED ORDER — DIPHENHYDRAMINE HCL 50 MG/ML IJ SOLN
12.5000 mg | INTRAMUSCULAR | Status: DC | PRN
  Administered 2018-06-22 – 2018-06-23 (×2): 12.5 mg via INTRAVENOUS
  Filled 2018-06-22 (×2): qty 1

## 2018-06-22 MED ORDER — MISOPROSTOL 50MCG HALF TABLET
50.0000 ug | ORAL_TABLET | ORAL | Status: DC
  Administered 2018-06-23: 50 ug via ORAL
  Filled 2018-06-22 (×8): qty 1

## 2018-06-22 MED ORDER — DIPHENHYDRAMINE HCL 50 MG/ML IJ SOLN: 12.5000 mg | INTRAMUSCULAR | Status: DC

## 2018-06-22 MED ORDER — FLEET ENEMA 7-19 GM/118ML RE ENEM: 1.0000 | ENEMA | RECTAL | Status: DC | PRN

## 2018-06-22 MED ORDER — ACETAMINOPHEN 325 MG PO TABS: 650.0000 mg | ORAL_TABLET | ORAL | Status: DC | PRN

## 2018-06-22 MED ORDER — LACTATED RINGERS IV SOLN
INTRAVENOUS | Status: DC
  Administered 2018-06-22 – 2018-06-24 (×5): via INTRAVENOUS

## 2018-06-22 MED ORDER — MISOPROSTOL 25 MCG QUARTER TABLET
25.0000 ug | ORAL_TABLET | ORAL | Status: DC | PRN
  Administered 2018-06-22 (×2): 25 ug via VAGINAL
  Filled 2018-06-22 (×3): qty 1

## 2018-06-22 MED ORDER — OXYCODONE-ACETAMINOPHEN 5-325 MG PO TABS: 1.0000 | ORAL_TABLET | ORAL | Status: DC | PRN

## 2018-06-22 MED ORDER — OXYCODONE-ACETAMINOPHEN 5-325 MG PO TABS: 2.0000 | ORAL_TABLET | ORAL | Status: DC | PRN

## 2018-06-22 MED ORDER — OXYTOCIN 40 UNITS IN LACTATED RINGERS INFUSION - SIMPLE MED
2.5000 [IU]/h | INTRAVENOUS | Status: DC
  Filled 2018-06-22: qty 1000

## 2018-06-22 MED ORDER — FENTANYL CITRATE (PF) 100 MCG/2ML IJ SOLN
100.0000 ug | INTRAMUSCULAR | Status: DC | PRN
  Administered 2018-06-22 – 2018-06-23 (×3): 100 ug via INTRAVENOUS
  Filled 2018-06-22 (×3): qty 2

## 2018-06-22 MED ORDER — TERBUTALINE SULFATE 1 MG/ML IJ SOLN: 0.2500 mg | Freq: Once | INTRAMUSCULAR | Status: DC | PRN

## 2018-06-22 MED ORDER — OXYTOCIN BOLUS FROM INFUSION: 500.0000 mL | Freq: Once | INTRAVENOUS | Status: DC

## 2018-06-22 MED ORDER — LACTATED RINGERS IV SOLN
500.0000 mL | INTRAVENOUS | Status: DC | PRN
  Administered 2018-06-23: 500 mL via INTRAVENOUS

## 2018-06-22 MED ORDER — PENICILLIN G 3 MILLION UNITS IVPB - SIMPLE MED
3.0000 10*6.[IU] | INTRAVENOUS | Status: DC
  Administered 2018-06-22 – 2018-06-24 (×9): 3 10*6.[IU] via INTRAVENOUS
  Filled 2018-06-22 (×7): qty 3
  Filled 2018-06-22 (×2): qty 100
  Filled 2018-06-22 (×2): qty 3

## 2018-06-22 MED ORDER — SODIUM CHLORIDE 0.9 % IV SOLN
5.0000 10*6.[IU] | Freq: Once | INTRAVENOUS | Status: AC
  Administered 2018-06-22: 5 10*6.[IU] via INTRAVENOUS
  Filled 2018-06-22: qty 5

## 2018-06-22 MED ORDER — SOD CITRATE-CITRIC ACID 500-334 MG/5ML PO SOLN
30.0000 mL | ORAL | Status: DC | PRN
  Administered 2018-06-24: 30 mL via ORAL
  Filled 2018-06-22: qty 15

## 2018-06-22 NOTE — Progress Notes (Addendum)
Patient ID: Taylor Carson, female   DOB: 28-Jan-1991, 27 y.o.   MRN: [id number] Requesting pain med   Vitals:   06/22/18 1857 06/22/18 1918 06/22/18 2004 06/22/18 2105  BP: 104/68 110/70 110/77 111/78  Pulse: 79 84 75 80  Resp: 16 18 19 18   Temp: 98.1 F (36.7 C) 97.6 F (36.4 C)    TempSrc: Oral Oral    Weight:      Height:       FHR reactive, category I 2-4 minutes  Dilation: 1 Effacement (%): 50 Cervical Position: Posterior Station: -2 Presentation: Vertex Exam by:: C. Hernandez,RN  Will order some Fentanyl Plan try to get Foley in later.   Continue Cytotec, but will change it to PO instead of vaginal

## 2018-06-22 NOTE — H&P (Signed)
OBSTETRIC ADMISSION HISTORY AND PHYSICAL  Taylor Carson is a 27 y.o. female G2P0010 with IUP at [redacted]w[redacted]d by LMP presenting for IOL due to oligohydramnios.   Reports fetal movement, contractions every 2-2.5 min. Denies vaginal bleeding, LOF, HA, visual changes, RUQ pain, chest pain, edema. She endorses chronic lower back pain, and new-onset left buttocks pain that "shoots" down her leg, began this AM.   She received her prenatal care at [facility].  Support person in labor: FOB  Ultrasounds . Anatomy U/S: Normal, cephalic presentation at 26 weeks 1 day. . [place] 06/22/18 - oligohydramnios  Prenatal History/Complications: . Group B Strep positive, being treated w/ PCN G ppx . Hyperthyroidism, TSH wnl throughout pregnancy . G2P0, hx of ectopic pregnancy managed medically in 2017 . Hx anxiety and depression, not currently being treated . Hx substance abuse (cocaine, last used 09/17/2017; marijuana last used Mon, 06/20/18 but denies regular use through pregnancy) . Former smoker (quit 09/2017)  Past Medical History: Past Medical History:  Diagnosis Date  . Anxiety   . Depression   . Ectopic pregnancy 03/2017  . Hyperthyroidism   . Shortness of breath 03/23/2018    Past Surgical History: Past Surgical History:  Procedure Laterality Date  . NO PAST SURGERIES      Obstetrical History: OB History    Gravida  2   Para      Term      Preterm      AB  1   Living        SAB      TAB      Ectopic  1   Multiple      Live Births              Social History: Social History   Socioeconomic History  . Marital status: Single    Spouse name: Not on file  . Number of children: Not on file  . Years of education: Not on file  . Highest education level: Not on file  Occupational History  . Not on file  Social Needs  . Financial resource strain: Not hard at all  . Food insecurity:    Worry: Never true    Inability: Never true  . Transportation needs:    Medical: No     Non-medical: Not on file  Tobacco Use  . Smoking status: Former Smoker    Packs/day: 0.25    Types: Cigarettes    Last attempt to quit: 09/11/2017    Years since quitting: 0.7  . Smokeless tobacco: Never Used  Substance and Sexual Activity  . Alcohol use: No  . Drug use: Yes    Frequency: 1.0 times per week    Types: Marijuana, Cocaine    Comment: Marijuana use 12/12/17  . Sexual activity: Yes    Partners: Male    Birth control/protection: None  Lifestyle  . Physical activity:    Days per week: 7 days    Minutes per session: 30 min  . Stress: Not at all  Relationships  . Social connections:    Talks on phone: Not on file    Gets together: Not on file    Attends religious service: Not on file    Active member of club or organization: Not on file    Attends meetings of clubs or organizations: Not on file    Relationship status: Not on file  Other Topics Concern  . Not on file  Social History Narrative  . Not on file  Family History: Family History  Problem Relation Age of Onset  . Miscarriages / [place] Mother   . Cancer Maternal Grandfather   . Diabetes Paternal Grandmother   . Heart disease Paternal Grandmother     Allergies: No Known Allergies   Medications: None  Ranit Previously on Zoloft "one week"  Review of Systems  All systems reviewed and negative except as stated in HPI  Blood pressure 116/76, pulse 67, temperature 97.7 F (36.5 C), temperature source Oral, resp. rate 16, height 5\' 4"  (1.626 m), weight 96 kg, last menstrual period 09/13/2017, unknown if currently breastfeeding. General appearance: alert and cooperative Lungs: no respiratory distress Heart: regular rate  Abdomen: soft, non-tender; gravid Pelvic: deferred Extremities: Homans sign is negative, no sign of DVT Presentation: cephalic Fetal monitoring: Reactive 120s  mod  +a  -d Uterine activity: irritability  Dilation: 1 Effacement (%): 60 Station: -2 Exam by:: Ferne Coe,  RN  Prenatal labs: ABO, Rh: --/--/A POS (09/11 1629) Antibody: NEG (09/11 1629) Rubella: 1.58 (01/15 0856) RPR: Non Reactive (06/19 1054)  HBsAg: Negative (01/15 0856)  HIV: Non Reactive (06/19 1054)  GBS:   Positive Glucola: 74/120/96/85 (03/17/18) Genetic screening:   Prenatal Transfer Tool  Maternal Diabetes: No Genetic Screening: Normal  Maternal Ultrasounds/Referrals: Normal Fetal Ultrasounds or other Referrals:  Fetal echo Maternal Substance Abuse:  Yes:  Type: Smoker, Marijuana Significant Maternal Medications:  None Significant Maternal Lab Results: Lab values include: Group B Strep positive  Results for orders placed or performed during the hospital encounter of 06/22/18 (from the past 24 hour(s))  Type and screen [facility] [place]   Collection Time: 06/22/18  4:29 PM  Result Value Ref Range   ABO/RH(D) A POS    Antibody Screen NEG    Sample Expiration      06/25/2018 Performed at [facility], [address], [place], [place] [address]   CBC   Collection Time: 06/22/18  4:30 PM  Result Value Ref Range   WBC 9.5 4.0 - 10.5 K/uL   RBC 4.42 3.87 - 5.11 MIL/uL   Hemoglobin 13.0 12.0 - 15.0 g/dL   HCT 66.5 99.3 - 57.0 %   MCV 86.7 78.0 - 100.0 fL   MCH 29.4 26.0 - 34.0 pg   MCHC 33.9 30.0 - 36.0 g/dL   RDW 17.7 93.9 - 03.0 %   Platelets 191 150 - 400 K/uL    Patient Active Problem List   Diagnosis Date Noted  . Oligohydramnios 06/22/2018  . Obesity affecting pregnancy in second trimester   . Supervision of normal first pregnancy 03/01/2018  . Depression 07/28/2017  . Enlarged thyroid gland 05/25/2017    Assessment/Plan:  Taylor Carson is a 27 y.o. G2P0010 at [redacted]w[redacted]d here for IOL due to oligohydramnios found on routine PN visit today.  Labor: pitocin with foley -- likely PROM at home > 72hours ago, no signs of infection so will only do GBS antibiotics  -- pain control: would like an epidural  Fetal Wellbeing: EFW 7lbs by Leopold's.  Cephalic by sutures.  -- GBS (Positive) - being treated w/ PCN ppx -- continuous fetal monitoring  Postpartum Planning -- plans to breastfeed -- R.I./[X] Tdap (03/30/18) -- Planning depo-provera after delivery -- plans to have baby circumcised outpt -- F/u mental health   Attestation: I have seen this patient and agree with the medical student's documentation. I have examined them separately, and we have discussed the plan of care. I have edited this note as appropriate. Briefly, this  patient is a 27yo G1P0 who is here for IOL for oligohydramnios.  We will start induction with cytotec and then FB. Story for PROM convincing and consistent with oligohydramnios.   Cristal Deer. Earlene Plater, DO OB/GYN Fellow

## 2018-06-22 NOTE — Progress Notes (Signed)
Pt reports 2 episodes of small amount of clear fluid leakage containing "white specks" on Monday. She has had no fluid leakage since then.  Pt informed that the ultrasound is considered a limited OB ultrasound and is not intended to be a complete ultrasound exam.  Patient also informed that the ultrasound is not being completed with the intent of assessing for fetal or placental anomalies or any pelvic abnormalities.  Explained that the purpose of today's ultrasound is to assess for presentation, BPP and amniotic fluid volume.  Patient acknowledges the purpose of the exam and the limitations of the study.    Consult w/Dr. Vergie Living in office re: oligo. He recommended notification to Ob on call for probable admission. Dr. Macon Large informed of pt and fetal status - pt to be admitted for IOL today.

## 2018-06-22 NOTE — Anesthesia Pain Management Evaluation Note (Signed)
  [facility] Pain Management Visit Note  Patient: Taylor Carson, 27 y.o., female  "Hello I am a member of the anesthesia team at [facility]. We have an anesthesia team available at all times to provide care throughout the hospital, including epidural management and anesthesia for C-section. I don't know your plan for the delivery whether it a natural birth, water birth, IV sedation, nitrous supplementation, doula or epidural, but we want to meet your pain goals."   1.Was your pain managed to your expectations on prior hospitalizations?   No prior hospitalizations  2.What is your expectation for pain management during this hospitalization?     Epidural  3.How can we help you reach that goal? epidural  Record the patient's initial score and the patient's pain goal.   Pain: 2  Pain Goal: 5 [facility] wants you to be able to say your pain was always managed very well.  Zerek Litsey 06/22/2018

## 2018-06-23 ENCOUNTER — Encounter (HOSPITAL_COMMUNITY): Payer: Self-pay | Admitting: Anesthesiology

## 2018-06-23 ENCOUNTER — Inpatient Hospital Stay (HOSPITAL_COMMUNITY): Payer: Medicaid Other | Admitting: Anesthesiology

## 2018-06-23 LAB — RPR: RPR Ser Ql: NONREACTIVE

## 2018-06-23 MED ORDER — LIDOCAINE HCL (PF) 1 % IJ SOLN
INTRAMUSCULAR | Status: DC | PRN
  Administered 2018-06-23 (×2): 6 mL via EPIDURAL

## 2018-06-23 MED ORDER — PHENYLEPHRINE 40 MCG/ML (10ML) SYRINGE FOR IV PUSH (FOR BLOOD PRESSURE SUPPORT): 80.0000 ug | PREFILLED_SYRINGE | INTRAVENOUS | Status: DC | PRN

## 2018-06-23 MED ORDER — OXYTOCIN 40 UNITS IN LACTATED RINGERS INFUSION - SIMPLE MED
1.0000 m[IU]/min | INTRAVENOUS | Status: DC
  Administered 2018-06-23 (×2): 16 m[IU]/min via INTRAVENOUS
  Administered 2018-06-23: 12 m[IU]/min via INTRAVENOUS
  Administered 2018-06-23: 14 m[IU]/min via INTRAVENOUS
  Administered 2018-06-23: 4 m[IU]/min via INTRAVENOUS
  Administered 2018-06-23: 12 m[IU]/min via INTRAVENOUS
  Administered 2018-06-23: 10 m[IU]/min via INTRAVENOUS
  Administered 2018-06-23: 2 m[IU]/min via INTRAVENOUS
  Administered 2018-06-23: 10 m[IU]/min via INTRAVENOUS
  Administered 2018-06-23: 14 m[IU]/min via INTRAVENOUS
  Administered 2018-06-23: 6 m[IU]/min via INTRAVENOUS
  Administered 2018-06-23: 8 m[IU]/min via INTRAVENOUS
  Administered 2018-06-24: 6 m[IU]/min via INTRAVENOUS
  Administered 2018-06-24: 10 m[IU]/min via INTRAVENOUS

## 2018-06-23 MED ORDER — LACTATED RINGERS IV SOLN
500.0000 mL | Freq: Once | INTRAVENOUS | Status: AC
  Administered 2018-06-23: 500 mL via INTRAVENOUS

## 2018-06-23 MED ORDER — EPHEDRINE 5 MG/ML INJ: 10.0000 mg | INTRAVENOUS | Status: DC | PRN

## 2018-06-23 MED ORDER — PHENYLEPHRINE 40 MCG/ML (10ML) SYRINGE FOR IV PUSH (FOR BLOOD PRESSURE SUPPORT)
80.0000 ug | PREFILLED_SYRINGE | INTRAVENOUS | Status: DC | PRN
  Filled 2018-06-23: qty 10

## 2018-06-23 MED ORDER — FENTANYL 2.5 MCG/ML BUPIVACAINE 1/10 % EPIDURAL INFUSION (WH - ANES)
14.0000 mL/h | INTRAMUSCULAR | Status: DC | PRN
  Administered 2018-06-23 – 2018-06-24 (×3): 14 mL/h via EPIDURAL
  Filled 2018-06-23 (×3): qty 100

## 2018-06-23 MED ORDER — LACTATED RINGERS AMNIOINFUSION
INTRAVENOUS | Status: DC
  Administered 2018-06-23 – 2018-06-24 (×2): via INTRAUTERINE

## 2018-06-23 MED ORDER — DIPHENHYDRAMINE HCL 50 MG/ML IJ SOLN
12.5000 mg | INTRAMUSCULAR | Status: DC | PRN
  Administered 2018-06-23: 12.5 mg via INTRAVENOUS
  Filled 2018-06-23: qty 1

## 2018-06-23 NOTE — Progress Notes (Signed)
Labor Progress Note Taylor Carson is a 27 y.o. G2P0010 at 8643w3d presented for IOL for oligohydramnios.  S: Patient lying on her side following epidural administration. Feels pain is being managed. Doing well, contracting every 3-3.5 min. FOB in room.  O:  BP (!) 104/57   Pulse 67   Temp 98.5 F (36.9 C) (Oral)   Resp 20   Ht 5\' 4"  (1.626 m)   Wt 96 kg   LMP 09/13/2017 (Exact Date)   SpO2 97%   BMI 36.33 kg/m  EFM: 130bpm/mod variability/+accelerations/occasional variables  CVE: Dilation: 4.5 Effacement (%): 70 Cervical Position: Middle Station: -1 Presentation: Vertex Exam by:: katherine g jones RN    A&P: 27 y.o. G2P0010 1543w3d for IOL for oligohydramnios. #Labor: Progressing well. S/p cytotec 2x and foley bulb. On pitocin 10 mu/min, last dose at 1430. Reduced from 16 mu/min due to tachysystole. #Pain: Managed. Epidural placed @ 1400. #FWB: Category II #GBS positive, PCN ppx being administered q4h   Taylor Carson, Medical Student 3:43 PM

## 2018-06-23 NOTE — Anesthesia Procedure Notes (Signed)
Epidural Patient location during procedure: OB Start time: 06/23/2018 1:50 PM End time: 06/23/2018 2:00 PM  Staffing Anesthesiologist: Leilani AbleHatchett, Margrit Minner, MD Performed: anesthesiologist   Preanesthetic Checklist Completed: patient identified, site marked, surgical consent, pre-op evaluation, timeout performed, IV checked, risks and benefits discussed and monitors and equipment checked  Epidural Patient position: sitting Prep: site prepped and draped and DuraPrep Patient monitoring: continuous pulse ox and blood pressure Approach: midline Location: L3-L4 Injection technique: LOR air  Needle:  Needle type: Tuohy  Needle gauge: 17 G Needle length: 9 cm and 9 Needle insertion depth: 9 cm Catheter type: closed end flexible Catheter size: 19 Gauge Catheter at skin depth: 15 cm Test dose: negative and Other  Assessment Sensory level: T10 Events: blood not aspirated, injection not painful, no injection resistance, negative IV test and no paresthesia  Additional Notes Reason for block:procedure for pain

## 2018-06-23 NOTE — Anesthesia Preprocedure Evaluation (Signed)
Anesthesia Evaluation  Patient identified by MRN, date of birth, ID band Patient awake    Reviewed: Allergy & Precautions, H&P , NPO status , Patient's Chart, lab work & pertinent test results  Airway Mallampati: II  TM Distance: >3 FB Neck ROM: full    Dental no notable dental hx. (+) Teeth Intact   Pulmonary former smoker,    Pulmonary exam normal breath sounds clear to auscultation       Cardiovascular negative cardio ROS Normal cardiovascular exam Rhythm:regular Rate:Normal     Neuro/Psych PSYCHIATRIC DISORDERS Anxiety Depression negative neurological ROS  negative psych ROS   GI/Hepatic negative GI ROS, Neg liver ROS,   Endo/Other    Renal/GU negative Renal ROS     Musculoskeletal   Abdominal (+) + obese,   Peds  Hematology negative hematology ROS (+)   Anesthesia Other Findings   Reproductive/Obstetrics (+) Pregnancy                             Anesthesia Physical Anesthesia Plan  ASA: II  Anesthesia Plan: Epidural   Post-op Pain Management:    Induction:   PONV Risk Score and Plan:   Airway Management Planned:   Additional Equipment:   Intra-op Plan:   Post-operative Plan:   Informed Consent: I have reviewed the patients History and Physical, chart, labs and discussed the procedure including the risks, benefits and alternatives for the proposed anesthesia with the patient or authorized representative who has indicated his/her understanding and acceptance.     Plan Discussed with:   Anesthesia Plan Comments:         Anesthesia Quick Evaluation

## 2018-06-23 NOTE — Progress Notes (Signed)
Labor Progress Note Tikesha Glori Luis Balentine is a 27 y.o. G2P0010 at 4244w3d presented for IOL for oligohydramnios.  S: Pt comfortable with epidural in place.    O:  BP 105/69   Pulse 68   Temp 98.5 F (36.9 C) (Oral)   Resp 18   Ht 5\' 4"  (1.626 m)   Wt 96 kg   LMP 09/13/2017 (Exact Date)   SpO2 97%   BMI 36.33 kg/m  EFM: 140/moderate var/positive accels/recurrent late decels  CVE: Dilation: 6 Effacement (%): 70 Cervical Position: Middle Station: -2 Presentation: Vertex Exam by:: Dr. Homero FellersFrank   A&P: 27 y.o. G2P0010 4144w3d IOL for oligohydramnios #Labor: Progressing on pit with minimal change s/p cytotec x2 and FB. AROMed at 1830. #Pain: epidural in place #FWB: category II #GBS positive PCN  Mirian MoPeter Caylea Foronda, MD 6:41 PM

## 2018-06-23 NOTE — Progress Notes (Signed)
Patient ID: Taylor Carson, female   DOB: 07/05/1991, 27 y.o.   MRN: [id number] Late entry, computer down  Inserted Foley bulb FHR reactive  UCs spaced out and irregular but picked up in frequency after Foley inserted  Cervix 1-2/80/-2/vtx  Vitals:   06/22/18 2215 06/22/18 2314 06/23/18 0020 06/23/18 0115  BP: 117/74 111/66 (!) 93/56 128/83  Pulse: 74 61 69 83  Resp:  18 17 17   Temp:  98.3 F (36.8 C)  (!) 97.5 F (36.4 C)  TempSrc:  Oral  Oral  Weight:      Height:

## 2018-06-23 NOTE — Progress Notes (Signed)
Labor Progress Note Taylor Carson is a 27 y.o. G2P0010 at 8356w3d presented for IOL for oligo. S: resting comfortably. Some cough and upper respiratory congestion, no fevers, wheeze.   O:  BP (!) 90/56   Pulse 82   Temp 98.5 F (36.9 C) (Oral)   Resp 18   Ht 5\' 4"  (1.626 m)   Wt 96 kg   LMP 09/13/2017 (Exact Date)   SpO2 99%   BMI 36.33 kg/m  EFM: 150/min var/+accels, recurrent variables  CVE: Dilation: 6 Effacement (%): 70 Cervical Position: Middle Station: -2 Presentation: Vertex Exam by:: Taylor Carson, Resident   A&P: 27 y.o. G2P0010 8056w3d IOL 2/2 oligo #Labor: Progressing well. S/p AROM 1830, cytotec x1 . S/p Pit break~9430mins for decreased variability and recurrent variables. Placed IUPC and start amnioinfusion w/ 300cc bolus and 150cc/h continuous infusion. Restart pit.  #Pain: epidural #FWB: Cat2 FHT- start amnioinfusion #GBS positive s/p PenG x8 doses    Taylor HughesKelsey D Maddyx Wieck, MD 11:35 PM

## 2018-06-23 NOTE — Progress Notes (Signed)
LABOR PROGRESS NOTE  Taylor Carson is a 27 y.o. G2P0010 at 3068w3d  admitted for IOL for oligohydramnios.   Subjective: Patient feeling her ctx more. Just received dose of Fentanyl, somewhat sleepy.   Objective: BP 113/62 (BP Location: Right Arm)   Pulse 69   Temp 98.2 F (36.8 C) (Oral)   Resp 20   Ht 5\' 4"  (1.626 m)   Wt 96 kg   LMP 09/13/2017 (Exact Date)   BMI 36.33 kg/m  or  Vitals:   06/23/18 0115 06/23/18 0415 06/23/18 0900 06/23/18 0933  BP: 128/83  126/80 113/62  Pulse: 83  64 69  Resp: 17  20   Temp: (!) 97.5 F (36.4 C) 98.2 F (36.8 C)    TempSrc: Oral Oral    Weight:      Height:        Dilation: 4 Effacement (%): 60 Cervical Position: Posterior Station: -2 Presentation: Vertex Exam by:: katherine g jones RN  FHT: baseline rate 130, minimal varibility, 10x10 acel, no decel Toco: q2-4 min   Labs: Lab Results  Component Value Date   WBC 9.5 06/22/2018   HGB 13.0 06/22/2018   HCT 38.3 06/22/2018   MCV 86.7 06/22/2018   PLT 191 06/22/2018    Patient Active Problem List   Diagnosis Date Noted  . Oligohydramnios 06/22/2018  . Obesity affecting pregnancy in second trimester   . Supervision of normal first pregnancy 03/01/2018  . Depression 07/28/2017  . Enlarged thyroid gland 05/25/2017    Assessment / Plan: 27 y.o. G2P0010 at 7368w3d here for IOL for oligo.   Labor: Induction. S/p FB and cytotec x2. On Pitocin at 6.  Fetal Wellbeing:  Cat II likely related to IV pain medication. No decels present.  Pain Control:  Receiving IV Fentanyl. Epidural upon request.  Anticipated MOD:  NSVD   Marcy Sirenatherine Dyami Umbach, D.O. OB Fellow  06/23/2018, 9:52 AM

## 2018-06-24 ENCOUNTER — Encounter (HOSPITAL_COMMUNITY)
Admission: AD | Disposition: A | Discharge status: Home / Self Care | Payer: Self-pay | Source: Home / Self Care | Attending: Obstetrics & Gynecology

## 2018-06-24 ENCOUNTER — Encounter (HOSPITAL_COMMUNITY): Payer: Self-pay | Admitting: General Practice

## 2018-06-24 LAB — CREATININE, SERUM
Creatinine, Ser: 1.05 mg/dL — ABNORMAL HIGH (ref 0.44–1.00)
GFR calc Af Amer: >60 mL/min (ref >60)

## 2018-06-24 LAB — CBC
HEMATOCRIT: 34.7 % — AB (ref 36.0–46.0)
HEMOGLOBIN: 11.6 g/dL — AB (ref 12.0–15.0)
MCH: 29.4 pg (ref 26.0–34.0)
MCHC: 33.4 g/dL (ref 30.0–36.0)
MCV: 87.8 fL (ref 78.0–100.0)
Platelets: 171 10*3/uL (ref 150–400)
RBC: 3.95 MIL/uL (ref 3.87–5.11)
RDW: 13.5 % (ref 11.5–15.5)
WBC: 18.8 10*3/uL — ABNORMAL HIGH (ref 4.0–10.5)

## 2018-06-24 SURGERY — Surgical Case
Anesthesia: Epidural | Wound class: Clean Contaminated

## 2018-06-24 MED ORDER — SIMETHICONE 80 MG PO CHEW
80.0000 mg | CHEWABLE_TABLET | ORAL | Status: DC
  Administered 2018-06-24 – 2018-06-26 (×2): 80 mg via ORAL
  Filled 2018-06-24 (×2): qty 1

## 2018-06-24 MED ORDER — SODIUM BICARBONATE 8.4 % IV SOLN
INTRAVENOUS | Status: DC | PRN
  Administered 2018-06-24 (×2): 5 mL via EPIDURAL
  Administered 2018-06-24: 10 mL via EPIDURAL

## 2018-06-24 MED ORDER — SCOPOLAMINE 1 MG/3DAYS TD PT72
MEDICATED_PATCH | TRANSDERMAL | Status: AC
  Filled 2018-06-24: qty 1

## 2018-06-24 MED ORDER — PRENATAL MULTIVITAMIN CH
1.0000 | ORAL_TABLET | Freq: Every day | ORAL | Status: DC
  Administered 2018-06-25 – 2018-06-26 (×2): 1 via ORAL
  Filled 2018-06-24 (×2): qty 1

## 2018-06-24 MED ORDER — ONDANSETRON HCL 4 MG/2ML IJ SOLN
INTRAMUSCULAR | Status: DC | PRN
  Administered 2018-06-24: 4 mg via INTRAVENOUS

## 2018-06-24 MED ORDER — WITCH HAZEL-GLYCERIN EX PADS: 1.0000 "application " | MEDICATED_PAD | CUTANEOUS | Status: DC | PRN

## 2018-06-24 MED ORDER — IBUPROFEN 600 MG PO TABS
600.0000 mg | ORAL_TABLET | Freq: Four times a day (QID) | ORAL | Status: DC
  Administered 2018-06-24 – 2018-06-26 (×8): 600 mg via ORAL
  Filled 2018-06-24 (×8): qty 1

## 2018-06-24 MED ORDER — OXYTOCIN 10 UNIT/ML IJ SOLN
INTRAVENOUS | Status: DC | PRN
  Administered 2018-06-24: 40 [IU] via INTRAVENOUS

## 2018-06-24 MED ORDER — STERILE WATER FOR IRRIGATION IR SOLN
Status: DC | PRN
  Administered 2018-06-24: 1000 mL

## 2018-06-24 MED ORDER — SODIUM CHLORIDE 0.9 % IR SOLN
Status: DC | PRN
  Administered 2018-06-24: 1000 mL

## 2018-06-24 MED ORDER — LACTATED RINGERS IV SOLN
INTRAVENOUS | Status: DC | PRN
  Administered 2018-06-24 (×2): via INTRAVENOUS

## 2018-06-24 MED ORDER — SCOPOLAMINE 1 MG/3DAYS TD PT72
MEDICATED_PATCH | TRANSDERMAL | Status: DC | PRN
  Administered 2018-06-24: 1 via TRANSDERMAL

## 2018-06-24 MED ORDER — CEFAZOLIN SODIUM-DEXTROSE 2-4 GM/100ML-% IV SOLN
2.0000 g | INTRAVENOUS | Status: AC
  Administered 2018-06-24: 2 g via INTRAVENOUS
  Filled 2018-06-24: qty 100

## 2018-06-24 MED ORDER — LACTATED RINGERS IV SOLN
INTRAVENOUS | Status: DC | PRN
  Administered 2018-06-24: 11:00:00 via INTRAVENOUS

## 2018-06-24 MED ORDER — SODIUM CHLORIDE 0.9 % IV SOLN
500.0000 mg | Freq: Once | INTRAVENOUS | Status: AC
  Administered 2018-06-24: 500 mg via INTRAVENOUS
  Filled 2018-06-24: qty 500

## 2018-06-24 MED ORDER — LACTATED RINGERS IV SOLN
INTRAVENOUS | Status: DC
  Administered 2018-06-24: 17:00:00 via INTRAVENOUS

## 2018-06-24 MED ORDER — OXYTOCIN 10 UNIT/ML IJ SOLN
INTRAMUSCULAR | Status: AC
  Filled 2018-06-24: qty 4

## 2018-06-24 MED ORDER — METOCLOPRAMIDE HCL 5 MG/ML IJ SOLN
INTRAMUSCULAR | Status: DC | PRN
  Administered 2018-06-24: 10 mg via INTRAVENOUS

## 2018-06-24 MED ORDER — SIMETHICONE 80 MG PO CHEW
80.0000 mg | CHEWABLE_TABLET | ORAL | Status: DC | PRN
  Administered 2018-06-25: 80 mg via ORAL

## 2018-06-24 MED ORDER — OXYCODONE HCL 5 MG PO TABS
5.0000 mg | ORAL_TABLET | ORAL | Status: DC | PRN
  Administered 2018-06-25 – 2018-06-26 (×3): 5 mg via ORAL
  Filled 2018-06-24 (×3): qty 1

## 2018-06-24 MED ORDER — ACETAMINOPHEN 325 MG PO TABS
650.0000 mg | ORAL_TABLET | ORAL | Status: DC | PRN
  Administered 2018-06-25 (×2): 650 mg via ORAL
  Filled 2018-06-24 (×2): qty 2

## 2018-06-24 MED ORDER — MORPHINE SULFATE (PF) 0.5 MG/ML IJ SOLN
INTRAMUSCULAR | Status: AC
  Filled 2018-06-24: qty 10

## 2018-06-24 MED ORDER — CHLOROPROCAINE HCL (PF) 3 % IJ SOLN
INTRAMUSCULAR | Status: AC
  Filled 2018-06-24: qty 20

## 2018-06-24 MED ORDER — ONDANSETRON HCL 4 MG/2ML IJ SOLN
INTRAMUSCULAR | Status: AC
  Filled 2018-06-24: qty 2

## 2018-06-24 MED ORDER — DIPHENHYDRAMINE HCL 25 MG PO CAPS
25.0000 mg | ORAL_CAPSULE | Freq: Four times a day (QID) | ORAL | Status: DC | PRN
  Administered 2018-06-24 (×2): 25 mg via ORAL
  Filled 2018-06-24 (×2): qty 1

## 2018-06-24 MED ORDER — ZOLPIDEM TARTRATE 5 MG PO TABS: 5.0000 mg | ORAL_TABLET | Freq: Every evening | ORAL | Status: DC | PRN

## 2018-06-24 MED ORDER — DIBUCAINE 1 % RE OINT: 1.0000 "application " | TOPICAL_OINTMENT | RECTAL | Status: DC | PRN

## 2018-06-24 MED ORDER — MORPHINE SULFATE (PF) 0.5 MG/ML IJ SOLN
INTRAMUSCULAR | Status: DC | PRN
  Administered 2018-06-24: 1 mg via INTRAVENOUS
  Administered 2018-06-24: 3 mg via EPIDURAL

## 2018-06-24 MED ORDER — OXYTOCIN 40 UNITS IN LACTATED RINGERS INFUSION - SIMPLE MED: 2.5000 [IU]/h | INTRAVENOUS | Status: AC

## 2018-06-24 MED ORDER — KETOROLAC TROMETHAMINE 30 MG/ML IJ SOLN: 30.0000 mg | Freq: Once | INTRAMUSCULAR | Status: DC | PRN

## 2018-06-24 MED ORDER — SIMETHICONE 80 MG PO CHEW
80.0000 mg | CHEWABLE_TABLET | Freq: Three times a day (TID) | ORAL | Status: DC
  Administered 2018-06-25 – 2018-06-26 (×3): 80 mg via ORAL
  Filled 2018-06-24 (×6): qty 1

## 2018-06-24 MED ORDER — ENOXAPARIN SODIUM 40 MG/0.4ML ~~LOC~~ SOLN
40.0000 mg | SUBCUTANEOUS | Status: DC
  Administered 2018-06-25 – 2018-06-26 (×2): 40 mg via SUBCUTANEOUS
  Filled 2018-06-24 (×3): qty 0.4

## 2018-06-24 MED ORDER — KETOROLAC TROMETHAMINE 60 MG/2ML IM SOLN
INTRAMUSCULAR | Status: AC
  Filled 2018-06-24: qty 2

## 2018-06-24 MED ORDER — SOD CITRATE-CITRIC ACID 500-334 MG/5ML PO SOLN: 30.0000 mL | ORAL | Status: DC

## 2018-06-24 MED ORDER — METOCLOPRAMIDE HCL 5 MG/ML IJ SOLN
INTRAMUSCULAR | Status: AC
  Filled 2018-06-24: qty 2

## 2018-06-24 MED ORDER — BUPIVACAINE HCL (PF) 0.5 % IJ SOLN
INTRAMUSCULAR | Status: AC
  Filled 2018-06-24: qty 30

## 2018-06-24 MED ORDER — OXYCODONE HCL 5 MG PO TABS
10.0000 mg | ORAL_TABLET | ORAL | Status: DC | PRN
  Administered 2018-06-26: 10 mg via ORAL
  Filled 2018-06-24: qty 2

## 2018-06-24 MED ORDER — DEXAMETHASONE SODIUM PHOSPHATE 10 MG/ML IJ SOLN
INTRAMUSCULAR | Status: AC
  Filled 2018-06-24: qty 1

## 2018-06-24 MED ORDER — COCONUT OIL OIL: 1.0000 "application " | TOPICAL_OIL | Status: DC | PRN

## 2018-06-24 MED ORDER — KETOROLAC TROMETHAMINE 30 MG/ML IJ SOLN
INTRAMUSCULAR | Status: AC
  Administered 2018-06-24: 30 mg
  Filled 2018-06-24: qty 1

## 2018-06-24 MED ORDER — SENNOSIDES-DOCUSATE SODIUM 8.6-50 MG PO TABS
2.0000 | ORAL_TABLET | ORAL | Status: DC
  Administered 2018-06-24 – 2018-06-26 (×2): 2 via ORAL
  Filled 2018-06-24 (×2): qty 2

## 2018-06-24 MED ORDER — BUPIVACAINE HCL (PF) 0.5 % IJ SOLN
INTRAMUSCULAR | Status: DC | PRN
  Administered 2018-06-24: 30 mL

## 2018-06-24 MED ORDER — MENTHOL 3 MG MT LOZG: 1.0000 | LOZENGE | OROMUCOSAL | Status: DC | PRN

## 2018-06-24 MED ORDER — DEXAMETHASONE SODIUM PHOSPHATE 10 MG/ML IJ SOLN
INTRAMUSCULAR | Status: DC | PRN
  Administered 2018-06-24: 10 mg via INTRAVENOUS

## 2018-06-24 SURGICAL SUPPLY — 40 items
APL SKNCLS STERI-STRIP NONHPOA (GAUZE/BANDAGES/DRESSINGS) ×1
BARRIER ADHS 3X4 INTERCEED (GAUZE/BANDAGES/DRESSINGS) IMPLANT
BENZOIN TINCTURE PRP APPL 2/3 (GAUZE/BANDAGES/DRESSINGS) ×2 IMPLANT
BRR ADH 4X3 ABS CNTRL BYND (GAUZE/BANDAGES/DRESSINGS)
CHLORAPREP W/TINT 26ML (MISCELLANEOUS) ×3 IMPLANT
CLAMP CORD UMBIL (MISCELLANEOUS) IMPLANT
CLOSURE WOUND 1/2 X4 (GAUZE/BANDAGES/DRESSINGS) ×1
CLOTH BEACON ORANGE TIMEOUT ST (SAFETY) ×3 IMPLANT
DRSG OPSITE POSTOP 4X10 (GAUZE/BANDAGES/DRESSINGS) ×3 IMPLANT
ELECT REM PT RETURN 9FT ADLT (ELECTROSURGICAL) ×3
ELECTRODE REM PT RTRN 9FT ADLT (ELECTROSURGICAL) ×1 IMPLANT
EXTRACTOR VACUUM KIWI (MISCELLANEOUS) IMPLANT
GAUZE SPONGE 4X4 12PLY STRL LF (GAUZE/BANDAGES/DRESSINGS) ×4 IMPLANT
GLOVE BIO SURGEON STRL SZ 6.5 (GLOVE) ×2 IMPLANT
GLOVE BIO SURGEONS STRL SZ 6.5 (GLOVE) ×1
GLOVE BIOGEL PI IND STRL 7.0 (GLOVE) ×2 IMPLANT
GLOVE BIOGEL PI INDICATOR 7.0 (GLOVE) ×4
GOWN STRL REUS W/TWL LRG LVL3 (GOWN DISPOSABLE) ×6 IMPLANT
KIT ABG SYR 3ML LUER SLIP (SYRINGE) ×2 IMPLANT
NDL HYPO 25X5/8 SAFETYGLIDE (NEEDLE) IMPLANT
NDL SAFETY ECLIPSE 18X1.5 (NEEDLE) IMPLANT
NEEDLE HYPO 18GX1.5 SHARP (NEEDLE) ×6
NEEDLE HYPO 22GX1.5 SAFETY (NEEDLE) ×2 IMPLANT
NEEDLE HYPO 25X5/8 SAFETYGLIDE (NEEDLE) ×3 IMPLANT
NS IRRIG 1000ML POUR BTL (IV SOLUTION) ×3 IMPLANT
PACK C SECTION WH (CUSTOM PROCEDURE TRAY) ×3 IMPLANT
PAD ABD 7.5X8 STRL (GAUZE/BANDAGES/DRESSINGS) ×2 IMPLANT
PAD OB MATERNITY 4.3X12.25 (PERSONAL CARE ITEMS) ×3 IMPLANT
PENCIL SMOKE EVAC W/HOLSTER (ELECTROSURGICAL) ×3 IMPLANT
RETRACTOR WND ALEXIS 25 LRG (MISCELLANEOUS) IMPLANT
RTRCTR WOUND ALEXIS 25CM LRG (MISCELLANEOUS)
STRIP CLOSURE SKIN 1/2X4 (GAUZE/BANDAGES/DRESSINGS) ×1 IMPLANT
SUT VIC AB 0 CT1 36 (SUTURE) ×18 IMPLANT
SUT VIC AB 2-0 CT1 27 (SUTURE) ×3
SUT VIC AB 2-0 CT1 TAPERPNT 27 (SUTURE) ×1 IMPLANT
SUT VIC AB 4-0 KS 27 (SUTURE) ×2 IMPLANT
SUT VIC AB 4-0 PS2 27 (SUTURE) ×3 IMPLANT
SYR CONTROL 10ML LL (SYRINGE) ×4 IMPLANT
TOWEL OR 17X24 6PK STRL BLUE (TOWEL DISPOSABLE) ×3 IMPLANT
TRAY FOLEY W/BAG SLVR 14FR LF (SET/KITS/TRAYS/PACK) IMPLANT

## 2018-06-24 NOTE — Anesthesia Postprocedure Evaluation (Signed)
Anesthesia Post Note  Patient: Taylor Carson  Procedure(s) Performed: CESAREAN SECTION (N/A )     Patient location during evaluation: PACU Anesthesia Type: Epidural Level of consciousness: awake Pain management: pain level controlled Vital Signs Assessment: post-procedure vital signs reviewed and stable Respiratory status: spontaneous breathing Cardiovascular status: stable Postop Assessment: no apparent nausea or vomiting, no headache, no backache and epidural receding Anesthetic complications: no    Last Vitals:  Vitals:   06/24/18 1315 06/24/18 1330  BP: 118/85 111/72  Pulse: 74 81  Resp: 18 18  Temp: 36.8 C 37.2 C  SpO2: 100% 98%    Last Pain:  Vitals:   06/24/18 1431  TempSrc:   PainSc: 0-No pain   Pain Goal: Patients Stated Pain Goal: 4 (06/22/18 2137)               Leala Bryand JR,JOHN Susann GivensFRANKLIN

## 2018-06-24 NOTE — Progress Notes (Signed)
LABOR PROGRESS NOTE  Nusayba Glori Luis Habib is a 27 y.o. G2P0010 at [redacted]w[redacted]d  admitted for IOL for oligo   Subjective: Patient doing well, comfortable with epidural- does not feel any pressure in bottom. Patient asleep in room on arrival.   Objective: 06/24/18 0231  98.6 F (37 C)  88  -  17  98/63  -  -  -  - EP   06/24/18 0200  -  83  -  18  96/54Abnormal   Right side lying/tilt  -  -  - CH   06/24/18 0130  -  85  -  18  84/57Abnormal   Right side lying/tilt  -  -  - CH   06/24/18 0113  98.4 F (36.9 C)  -  -  -  -  -  -  -  - CH   06/24/18 0108  -  -  -  -  -  -  99 %  -  - CH   06/24/18 0104  -  -  -  -  -  -  99 %  -  - CH   06/24/18 0100  -  82  -  18  100/56Abnormal   -  99 %  -  - CH     Dilation: 6 Effacement (%): 60 Cervical Position: Middle Station: -2 Presentation: Vertex Exam by:: Lanice ShirtsV Jaiya Mooradian CNM FHT: baseline rate 145, minimal varibility, +acel (10x10), late decels Toco: irregular mild contractions with IUPC in place   Labs: Lab Results  Component Value Date   WBC 9.5 06/22/2018   HGB 13.0 06/22/2018   HCT 38.3 06/22/2018   MCV 86.7 06/22/2018   PLT 191 06/22/2018    Patient Active Problem List   Diagnosis Date Noted  . Oligohydramnios 06/22/2018  . Obesity affecting pregnancy in second trimester   . Supervision of normal first pregnancy 03/01/2018  . Depression 07/28/2017  . Enlarged thyroid gland 05/25/2017    Assessment / Plan: 27 y.o. G2P0010 at [redacted]w[redacted]d here for IOL for oligo   Labor: Not progressing, no cervical change in 8 hours- Dr Alysia PennaErvin called to bedside to assess. Recommends extended pitocin break and restarting pitocin at 6am on 25milli-unit pit and reassess cervical change 4 hours after restarting pitocin  Fetal Wellbeing:  Cat II  Pain Control:  Epidural  Anticipated MOD:  Hopeful SVD   Sharyon CableRogers, Jahson Emanuele C, CNM 06/24/2018

## 2018-06-24 NOTE — Transfer of Care (Signed)
Immediate Anesthesia Transfer of Care Note  Patient: Taylor Carson  Procedure(s) Performed: CESAREAN SECTION (N/A )  Patient Location: PACU  Anesthesia Type:Epidural  Level of Consciousness: awake, alert  and oriented  Airway & Oxygen Therapy: Patient Spontanous Breathing  Post-op Assessment: Report given to RN and Post -op Vital signs reviewed and stable  Post vital signs: Reviewed and stable  Last Vitals:  Vitals Value Taken Time  BP 119/64 06/24/2018 11:53 AM  Temp    Pulse 113 06/24/2018 11:56 AM  Resp 21 06/24/2018 11:56 AM  SpO2 98 % 06/24/2018 11:56 AM  Vitals shown include unvalidated device data.  Last Pain:  Vitals:   06/24/18 0830  TempSrc:   PainSc: 4       Patients Stated Pain Goal: 4 (06/22/18 2137)  Complications: No apparent anesthesia complications

## 2018-06-24 NOTE — Lactation Note (Signed)
This note was copied from a baby's chart. Lactation Consultation Note Baby 28 hrs old.  Mom stated baby just BF well to Lt. Breast. Mom has large breast, edema to breast. [place] and nipple not compressible. Nipple flat. [place] doesn't feel that baby could had latch. Just lick. Mom stated baby kept shaking his head trying to latch. Reverse pressure not helpful enough to compress. Breast massaged. Hand expression thick breast tissue. Colostrum easily poured out. Collected 5 ml.  Taught hand expression. Mom demonstrated hand expression. Praised mom.  Fitted #16 NS. Mom demonstrated application. Taught "C" hold and firming breast for latching.  Baby suckle occasionally. Inserted colostrum in NS to stimulate baby to suck. Baby BF fair w/LC assisting in compressing breast to firm for baby to maintain latch. Baby tongue thrust at times.  Mom stated baby has been sucking on his top lip so she fed in him football hold while sitting on couch.  Gave mom shells and hand pump to pre-pump prior to application of NS. Mom needs DEBP. Took kit into rm. LC will look for pump. Discussed post pump for stimulation. Left #20 NS as well. Took #21 flanges d/t small nipples. Mom encouraged to feed baby 8-12 times/24 hours and with feeding cues. . Newborn behavior, STS, I&O, cluster feeding, milk storage, supply and demand discussed.  encouraged mom to call for assistance or questions.  [place] brochure given w/resources, support groups and [place] services.  Patient Name: Taylor Carson [id number] Date: 06/24/2018 Reason for consult: Initial assessment;Maternal endocrine disorder;1st time breastfeeding Type of Endocrine Disorder?: Thyroid   Maternal Data Has patient been taught Hand Expression?: Yes Does the patient have breastfeeding experience prior to this delivery?: No  Feeding Feeding Type: Breast Fed Length of feed: 10 min  LATCH Score Latch: Repeated attempts needed to sustain latch, nipple held in mouth  throughout feeding, stimulation needed to elicit sucking reflex.  Audible Swallowing: A few with stimulation  Type of Nipple: Flat  Comfort (Breast/Nipple): Filling, red/small blisters or bruises, mild/mod discomfort(edema to breast)  Hold (Positioning): Assistance needed to correctly position infant at breast and maintain latch.  LATCH Score: 5  Interventions Interventions: Support pillows;Breast feeding basics reviewed;Assisted with latch;Position options;Skin to skin;Expressed milk;Breast massage;Hand express;Shells;Pre-pump if needed;Hand pump;Reverse pressure;Breast compression;Adjust position(looking for DEBP)  Lactation Tools Discussed/Used Tools: Shells;Pump;Nipple [place] Fuel;Flanges Nipple shield size: 16;20 Flange Size: 21 Shell Type: Inverted Breast pump type: Manual(kit in rm. looking for DEBP) WIC Program: Yes Pump Review: Setup, frequency, and cleaning;Milk Storage Initiated by:: Allayne Stack RN IBCLC Date initiated:: 06/24/18   Consult Status Consult Status: Follow-up Date: 06/25/18 Follow-up type: In-patient    Theodoro Kalata 06/24/2018, 9:36 PM

## 2018-06-24 NOTE — Progress Notes (Signed)
OB/GYN Faculty Practice: Labor Progress Note  Subjective: Doing well, feeling congested. Denies any pain. Partner coming from [facility] and mother coming from work to be with her.   Objective: BP 105/65   Pulse 95   Temp 98.3 F (36.8 C) (Oral)   Resp 18   Ht 5\' 4"  (1.626 m)   Wt 96 kg   LMP 09/13/2017 (Exact Date)   SpO2 100%   BMI 36.33 kg/m  Gen: congested sounding, resting Dilation: 6 Effacement (%): 60 Cervical Position: Middle Station: -2 Presentation: Vertex Exam by:: Casey Burkitt. Hernandez, RN  Assessment and Plan: 27 y.o. G2P0010 6348w4d who was originally admitted with oligohydramnios two days ago. Diagnosed in clinic with AFI 4. Does have history of possible PROM at home 4 days ago with no onset of contractions. Induction started about 48 hours ago with cytotec and FB. Progressed with induction and started on pitocin which has been off for about 18hours total, with breaks for fetal intolerance because of late decelerations. Multiple interventions tried including IUPC with amnioinfusion, oxygen, fluid boluses and position changes. She has an epidural in place for pain control. Her cervical exam has remained unchanged for about 14 hours. She is not yet in active labor. We discussed that given the combination of factors including failure to progress/failed induction as well as fetal decelerations both with and without pitocin that a C-section would be the best option for her at this time. Currently Category II strip though good variability remains. She is in agreement with the plan.   The risks of cesarean section discussed with the patient included but were not limited to: bleeding which may require transfusion or reoperation; infection which may require antibiotics; injury to bowel, bladder, ureters or other surrounding organs; injury to the fetus; need for additional procedures including hysterectomy in the event of a life-threatening hemorrhage; placental abnormalities wth subsequent pregnancies,  incisional problems, thromboembolic phenomenon and other postoperative/anesthesia complications. The patient concurred with the proposed plan, giving informed written consent for the procedure.   Patient has been NPO aside from sips of clear fluids for at least the last 12 hours, and she will remain NPO for procedure. Anesthesia and OR aware. Preoperative prophylactic antibiotics and SCDs ordered on call to the OR.  To OR when ready.  Cristal DeerLaurel S. Earlene PlaterWallace, DO OB/GYN Fellow, Faculty Practice  9:52 AM

## 2018-06-24 NOTE — Op Note (Addendum)
[facility] PROCEDURE DATE: 06/24/2018  PREOPERATIVE DIAGNOSES: Intrauterine pregnancy at 6811w4d weeks gestation; failure to progress: arrest of dilation POSTOPERATIVE DIAGNOSES: The same PROCEDURE: Primary Low Transverse Cesarean Section  SURGEON:  Dr. Scheryl DarterJames Detron Carras ASSISTANT:  Dr. Rhett BannisterLaurel Wallace  INDICATIONS: Taylor Carson is a 27 y.o. G2P0010 at 5611w4d here for cesarean section secondary to the indications listed under preoperative diagnoses. The risks of surgery were discussed with the patient including but were not limited to: bleeding which may require transfusion or reoperation; infection which may require antibiotics; injury to bowel, bladder, ureters or other surrounding organs; injury to the fetus; need for additional procedures including hysterectomy in the event of a life-threatening hemorrhage; placental abnormalities wth subsequent pregnancies, incisional problems, thromboembolic phenomenon and other postoperative/anesthesia complications. The patient concurred with the proposed plan, giving informed written consent for the procedures.    FINDINGS:  Viable female infant in cephalic presentation.  Apgars 1 and 9.  Thin meconium amniotic fluid.  Intact placenta, three vessel cord.  Normal uterus, fallopian tubes and ovaries bilaterally. ANESTHESIA: Epidural INTRAVENOUS FLUIDS: 1800 ml LR ESTIMATED BLOOD LOSS: 1001 ml URINE OUTPUT:  450 ml - initially tinged but clear at end of case SPECIMENS: Placenta sent to L&D COMPLICATIONS: None immediate  PROCEDURE IN DETAIL:  The patient preoperatively received intravenous antibiotics (azithromycin, Ancef) and had sequential compression devices applied to her lower extremities.   She was then taken to the operating room, the epidural anesthesia was dosed up to surgical level T10 and was found to be adequate. She was then placed in a dorsal supine position with a leftward tilt, and prepped and draped in a sterile manner.  A foley catheter was  placed into her bladder and attached to constant gravity.  After an adequate timeout was performed, a Pfannenstiel skin incision was made with scalpel and carried through to the underlying layer of fascia. The fascia was incised in the midline, and this incision was extended bilaterally using the Mayo scissors.  Kocher clamps were applied to the superior aspect of the fascial incision and the underlying rectus muscles were dissected off bluntly. A similar process was carried out on the inferior aspect of the fascial incision. The rectus muscles were separated in the midline sharply and the peritoneum was entered bluntly. Attention was turned to the lower uterine segment where a low transverse hysterotomy was made with a scalpel and extended bilaterally bluntly.  The infant was successfully delivered, the cord was clamped and cut immediately by request of neonatology provider and the infant was handed over to awaiting neonatology team. Uterine massage was then administered, and the placenta delivered intact with a three-vessel cord. The uterus was then cleared of clot and debris.  The hysterotomy was closed with 0 Vicryl in a running locked fashion, and an imbricating layer was also placed with 0 Vicryl.  The pelvis was cleared of all clot and debris. Hemostasis was confirmed on all surfaces.  The peritoneum was reapproximated using 2-0 Vicryl running stitch. The fascia was then closed using 0 Vicryl in a running fashion.   30 ml of 0.5% Marcaine was injected subcutaneously around the incision.  The skin was closed with a Mellody DanceKeith needle with Vicryl. The patient tolerated the procedure well. Sponge, lap, instrument and needle counts were correct x 3.  She was taken to the recovery room in stable condition.   Tamera StandsWallace, Laurel S, DO 06/24/2018 11:49 AM

## 2018-06-24 NOTE — Anesthesia Postprocedure Evaluation (Signed)
Anesthesia Post Note  Patient: Taylor Carson  Procedure(s) Performed: CESAREAN SECTION (N/A )     Patient location during evaluation: Mother Baby Anesthesia Type: Epidural Level of consciousness: awake and alert Pain management: pain level controlled Vital Signs Assessment: post-procedure vital signs reviewed and stable Respiratory status: spontaneous breathing, nonlabored ventilation and respiratory function stable Cardiovascular status: stable Postop Assessment: no headache, no backache, epidural receding, able to ambulate, adequate PO intake, no apparent nausea or vomiting and patient able to bend at knees Anesthetic complications: no    Last Vitals:  Vitals:   06/24/18 1530 06/24/18 1630  BP: 122/82 118/76  Pulse: 70 78  Resp: 18 18  Temp: 36.9 C 36.6 C  SpO2: 99% 99%    Last Pain:  Vitals:   06/24/18 1630  TempSrc: Oral  PainSc: 0-No pain   Pain Goal: Patients Stated Pain Goal: 4 (06/22/18 2137)               Donnalee CurryMalinova,Calvary Difranco Hristova

## 2018-06-24 NOTE — Progress Notes (Signed)
Labor Progress Note Octivia Glori Luis Castorena is a 27 y.o. G2P0010 at 7376w4d presented for IOL for oligo. S: comfortable on epidural.   O:  BP 105/72   Pulse 80   Temp 98 F (36.7 C) (Oral)   Resp 18   Ht 5\' 4"  (1.626 m)   Wt 96 kg   LMP 09/13/2017 (Exact Date)   SpO2 100%   BMI 36.33 kg/m  EFM: 150/min var/+accels, some late decels, some prolonged.  CVE: Dilation: 6 Effacement (%): 60 Cervical Position: Middle Station: -2 Presentation: Vertex Exam by:: Lanice ShirtsV Rogers, CNM   A&P: 27 y.o. G2P0010 5376w4d for IOL 2/2 oligo #Labor: Progressing well. Progressing well. S/p AROM 1830, cytotec x1, amnioinfusion bolus 300cc. On continuous amnioinfusion 150cc/h. Having recurrent late decels- 3 hr pit break.  #Pain: epidural #FWB: Cat2 FHT- pit break and moniotor closely. If does not tolerte labor, might need c/s #GBS positive on PenG   Denzil HughesKelsey D Lovett Coffin, MD 6:16 AM

## 2018-06-24 NOTE — Progress Notes (Signed)
Dr. Debroah LoopArnold present on unit. I asked Dr. Debroah LoopArnold to review this pt's fetal monitoring strip. Late decels and prolonged decels present. No new order, no new interventions given per Dr. Debroah LoopArnold.

## 2018-06-24 NOTE — Addendum Note (Signed)
Addendum  created 06/24/18 1833 by Elgie CongoMalinova, Liev Brockbank H, CRNA   Sign clinical note

## 2018-06-25 ENCOUNTER — Encounter (HOSPITAL_COMMUNITY): Payer: Self-pay | Admitting: Obstetrics & Gynecology

## 2018-06-25 DIAGNOSIS — O4103X0 Oligohydramnios, third trimester, not applicable or unspecified: Secondary | ICD-10-CM

## 2018-06-25 DIAGNOSIS — Z3A40 40 weeks gestation of pregnancy: Secondary | ICD-10-CM

## 2018-06-25 DIAGNOSIS — O480 Post-term pregnancy: Secondary | ICD-10-CM

## 2018-06-25 DIAGNOSIS — O99824 Streptococcus B carrier state complicating childbirth: Secondary | ICD-10-CM

## 2018-06-25 LAB — CBC
HEMATOCRIT: 32.1 % — AB (ref 36.0–46.0)
HEMOGLOBIN: 10.8 g/dL — AB (ref 12.0–15.0)
MCH: 29.3 pg (ref 26.0–34.0)
MCHC: 33.6 g/dL (ref 30.0–36.0)
MCV: 87.2 fL (ref 78.0–100.0)
PLATELETS: 173 10*3/uL (ref 150–400)
RBC: 3.68 MIL/uL — AB (ref 3.87–5.11)
RDW: 13.4 % (ref 11.5–15.5)
WBC: 20.4 10*3/uL — ABNORMAL HIGH (ref 4.0–10.5)

## 2018-06-25 LAB — TSH: TSH: 1.394 u[IU]/mL (ref 0.350–4.500)

## 2018-06-25 MED ORDER — ALBUTEROL SULFATE (2.5 MG/3ML) 0.083% IN NEBU
2.5000 mg | INHALATION_SOLUTION | Freq: Once | RESPIRATORY_TRACT | Status: AC
  Administered 2018-06-25: 2.5 mg via RESPIRATORY_TRACT
  Filled 2018-06-25: qty 3

## 2018-06-25 NOTE — Progress Notes (Signed)
Respiratory present to administer a nebulizer treatment to the Pt for wheezing heard with inspiration. Pt reports this started approx 3 weeks ago.

## 2018-06-25 NOTE — Clinical Social Work Maternal (Signed)
  CLINICAL SOCIAL WORK MATERNAL/CHILD NOTE  Patient Details  Name: Taylor Carson MRN: [id number] Date of Birth: 25-Aug-1991  Date:  06/25/2018  Clinical Social Worker Initiating Note:  Madilyn Fireman, MSW, LCSW-A Date/Time: Initiated:  06/25/18/1416     Child's Name:  Taylor Carson   Biological Parents:  Mother, Father   Need for Interpreter:  None   Reason for Referral:  Current Substance Use/Substance Use During Pregnancy    Address:  [address] [place] [address]    Phone number:  [phone] (home)     Additional phone number:   Household Members/Support Persons (HM/SP):   Household Member/Support Person 1   HM/SP Name Relationship DOB or Age  HM/SP -1 Taylor Carson FOB, Spouse    HM/SP -2        HM/SP -3        HM/SP -4        HM/SP -5        HM/SP -6        HM/SP -7        HM/SP -8          Natural Supports (not living in the home):  Extended Family, Friends, Immediate Family   Professional Supports: None   Employment: Unemployed   Type of Work:     Education:  Programmer, systems   Homebound arranged:    Museum/gallery curator Resources:  [employer]   Other Resources:  [employer], Physicist, medical    Cultural/Religious Considerations Which May Impact Care:  Nondenominational   Strengths:  Ability to meet basic needs , Engineer, materials, Home prepared for child    Psychotropic Medications:    None      Pediatrician:    [place]  Pediatrician List:   [place]  [place]      Pediatrician Fax Number:    Risk Factors/Current Problems:      Cognitive State:  Alert , Able to Concentrate    Mood/Affect:  Comfortable , Calm , Happy , Interested    CSW Assessment: CSW received consult for MOB due to history of marijuana use. CSW met with MOB and baby Taylor Carson at bedside to complete assessment. CSW inquired with  MOB regarding her history of THC use, MOB reports all THC use was prior to pregnancy. MOB denies any substance use during pregnancy. MOB was educated on hospital drug screening policies, MOB did not have questions or concerns. CSW informed MOB that infant's UDS was negative. This is MOB's first child. MOB reports that FOB is Taylor Carson and that the couple lives together. MOB reports great family and friend support. MOB reports having a new car seat for infant, CSW educated MOB on safe transportation for newborn. MOB reports that baby will sleep in a crib. SIDS precautions thoroughly reviewed. MOB reports that baby will receive pediatric care at [facility]. MOB reports she is unemployed but receives [employer], [employer], and Physicist, medical. MOB reports having all items at home necessary to care for baby.   CSW will continue monitoring chart for cord results and will make report if warranted.   CSW Plan/Description:  CSW Will Continue to Monitor Umbilical Cord Tissue Drug Screen Results and Make Report if Warranted, No Further Intervention Required/No Barriers to Discharge    Taylor Carson, [place] 06/25/2018, 2:23 PM

## 2018-06-25 NOTE — Progress Notes (Signed)
Post Partum Day #1 Subjective: Pt is a 27 yo G2P1011 2821w4d s/p pLTCS for FTP w/ IOL for oligohydramnios. PPH w/ EBL 1051 mL. Pregnancy complicated by THC use. Pt is doing well, no significant overnight events. Reports some increased pain at incision site, was given ibuprofen but no significant relief. Tolerating PO intake, urinating normally, +flatus. Lochia small. Denies any excessive drainage from incision site, nausea, vomiting, dizziness, HA, blurred vision, or RUQ pain. She is breastfeeding, requests Depo Provera for contraception.    Objective: Blood pressure 115/74, pulse 61, temperature 98.6 F (37 C), temperature source Oral, resp. rate 18, height 5\' 4"  (1.626 m), weight 96 kg, last menstrual period 09/13/2017, SpO2 100 %, unknown if currently breastfeeding.  Physical Exam:  General: alert, cooperative, appears stated age and no distress Lochia: appropriate Uterine Fundus: firm Incision: healing well, no significant drainage, no dehiscence DVT Evaluation: No evidence of DVT seen on physical exam.  Recent Labs    06/22/18 1630 06/24/18 1434  HGB 13.0 11.6*  HCT 38.3 34.7*    Assessment/Plan: Serum creatinine elevated, recheck w/ CMP tomorrow morning. Plan for discharge tomorrow. Needs CSW consult for THC use during pregnancy. Breastfeeding. Plan for OP circumcision. F/u OP for contraception with Depo Provera.    LOS: 3 days   Taylor Carson 06/25/2018, 4:23 AM

## 2018-06-26 LAB — COMPREHENSIVE METABOLIC PANEL
ALBUMIN: 2.5 g/dL — AB (ref 3.5–5.0)
ALK PHOS: 161 U/L — AB (ref 38–126)
ALT: 12 U/L (ref 0–44)
AST: 18 U/L (ref 15–41)
Anion gap: 6 (ref 5–15)
BUN: 8 mg/dL (ref 6–20)
CALCIUM: 8.5 mg/dL — AB (ref 8.9–10.3)
CHLORIDE: 109 mmol/L (ref 98–111)
CO2: 25 mmol/L (ref 22–32)
Creatinine, Ser: 0.93 mg/dL (ref 0.44–1.00)
GFR calc non Af Amer: >60 mL/min (ref >60)
Glucose, Bld: 93 mg/dL (ref 70–99)
Potassium: 3.7 mmol/L (ref 3.5–5.1)
SODIUM: 140 mmol/L (ref 135–145)
Total Bilirubin: 0.2 mg/dL — ABNORMAL LOW (ref 0.3–1.2)
Total Protein: 5.8 g/dL — ABNORMAL LOW (ref 6.5–8.1)

## 2018-06-26 MED ORDER — ACETAMINOPHEN 325 MG PO TABS: 650.0000 mg | ORAL_TABLET | ORAL | 0 refills | Status: DC | PRN

## 2018-06-26 MED ORDER — IBUPROFEN 600 MG PO TABS: 600.0000 mg | ORAL_TABLET | Freq: Four times a day (QID) | ORAL | 0 refills | Status: DC

## 2018-06-26 MED ORDER — OXYCODONE HCL 5 MG PO TABS: 5.0000 mg | ORAL_TABLET | ORAL | 0 refills | Status: DC | PRN

## 2018-06-26 NOTE — Lactation Note (Signed)
This note was copied from a baby's chart. Lactation Consultation Note  Patient Name: Boy Mila MerryDestiny Lavin Today's Date: 06/26/2018  I received call from Johnny BridgeMartha, [place]RN that infant was noted to have uric acid crystals in diaper & lips were dry. Mom had been using a pacifier frequently, which was likely covering up feeding cues/delaying feedings. Infant was not effectively feeding at breast despite the fact that Mom's milk is coming to volume. I was able to quickly hand express 18 mL which was cup fed to infant with ease.   Infant was cup fed an additional time until it could be ascertained why infant was unable to transfer milk at the breast in the presence of Mom's volume. Good tongue mobility was noted.   Mom was assisted w/pumpimg. She had not pumped previously b/c she wasn't sure how. She pumped 45 mL with ease. Mom may need a size 21 flange for her R breast & a size 24 flange for her L.   The nipple shield sizes were changed from a size 16 to a size 20 & then to a size 24 in the hopes that the increased pliability of the teat would help infant to transfer at the milk better.   With the size 24 NS, infant was put to breast & was supplemented using a 5 Fr & syringe. Once infant was enticed to suckle, he could drink from the breast well (and he could "pull" from the syringe without me pushing it). Mom's breast was also noted to be softer after that feeding.  Plan: Continue feeding at breast, but supplement at breast when infant needs to be enticed or when his pauses at the breast seem too long.   Lurline HareRichey, Shawnika Pepin [facility] 06/26/2018, 10:32 AM

## 2018-06-26 NOTE — Discharge Instructions (Signed)

## 2018-06-26 NOTE — Discharge Summary (Addendum)
Postpartum Discharge Summary     Patient Name: Taylor Carson DOB: 01/11/1991 MRN: [id number]  Date of admission: 06/22/2018 Delivering Provider: Adam PhenixARNOLD, JAMES G   Date of discharge: 06/26/2018  Admitting diagnosis: CRAMPING, BLEEDING Intrauterine pregnancy: 4480w4d     Secondary diagnosis:  Active Problems:   Oligohydramnios  Additional problems: Obesity, Hyperthyroidism     Discharge diagnosis: Term Pregnancy Delivered                                                                                                Post partum procedures:none  Augmentation: AROM, Pitocin, Cytotec and Foley Balloon  Complications: None  Hospital course:  Onset of Labor With Unplanned C/S  27 y.o. yo G2P1011 at 2380w4d was admitted in Latent Labor on 06/22/2018 for IOL d/t Oligohydramnios. Patient had a labor course significant for failure to progress. Membrane Rupture Time/Date: 6:29 PM ,06/23/2018   The patient went for cesarean section due to Arrest of Dilation, and delivered a Viable infant,06/24/2018  Details of operation can be found in separate operative note. Patient had an uncomplicated postpartum course.  She is ambulating,tolerating a regular diet, passing flatus, and urinating well.  Patient is discharged home in stable condition 06/26/18.  Magnesium Sulfate recieved: No BMZ received: No  Physical exam  Vitals:   06/25/18 1432 06/25/18 2028 06/25/18 2106 06/26/18 0530  BP: 106/62   114/71  Pulse: 64  66 62  Resp:   18 18  Temp: (!) 97.5 F (36.4 C)   98.2 F (36.8 C)  TempSrc: Oral   Oral  SpO2: 100% 99% 99%   Weight:      Height:       General: alert, cooperative and no distress Lochia: appropriate Uterine Fundus: firm Incision: Healing well with no significant drainage DVT Evaluation: No evidence of DVT seen on physical exam. Labs: Lab Results  Component Value Date   WBC 20.4 (H) 06/25/2018   HGB 10.8 (L) 06/25/2018   HCT 32.1 (L) 06/25/2018   MCV 87.2 06/25/2018    PLT 173 06/25/2018   CMP Latest Ref Rng & Units 06/26/2018  Glucose 70 - 99 mg/dL 93  BUN 6 - 20 mg/dL 8  Creatinine 8.460.44 - 9.621.00 mg/dL 9.520.93  Sodium 841135 - 324145 mmol/L 140  Potassium 3.5 - 5.1 mmol/L 3.7  Chloride 98 - 111 mmol/L 109  CO2 22 - 32 mmol/L 25  Calcium 8.9 - 10.3 mg/dL 4.0(N8.5(L)  Total Protein 6.5 - 8.1 g/dL 0.2(V5.8(L)  Total Bilirubin 0.3 - 1.2 mg/dL 2.5(D0.2(L)  Alkaline Phos 38 - 126 U/L 161(H)  AST 15 - 41 U/L 18  ALT 0 - 44 U/L 12    Discharge instruction: per After Visit Summary and "Baby and Me Booklet".  After visit meds:  Allergies as of 06/26/2018   No Known Allergies     Medication List    TAKE these medications   acetaminophen 325 MG tablet Commonly known as:  TYLENOL Take 2 tablets (650 mg total) by mouth every 4 (four) hours as needed (for pain scale < 4). What changed:    medication  strength  how much to take  when to take this  reasons to take this   ibuprofen 600 MG tablet Commonly known as:  ADVIL,MOTRIN Take 1 tablet (600 mg total) by mouth every 6 (six) hours.   oxyCODONE 5 MG immediate release tablet Commonly known as:  Oxy IR/ROXICODONE Take 1 tablet (5 mg total) by mouth every 4 (four) hours as needed (pain scale 4-7).   prenatal multivitamin Tabs tablet Take 1 tablet by mouth daily at 12 noon.       Diet: routine diet  Activity: Advance as tolerated. Pelvic rest for 6 weeks.   Outpatient follow up:2 weeks Follow up Appt:No future appointments. Follow up Visit:No follow-ups on file.   Please schedule this patient for Postpartum visit in: 2 week visit for wound check/behavioral medicine visit with the following provider: Any provider For C/S patients schedule nurse incision check in weeks 2 weeks: yes Low risk pregnancy complicated by: hyperthyroidism Delivery mode:  CS Anticipated Birth Control:  Depo PP Procedures needed: Incision check  Schedule Integrated BH visit: yes      Newborn Data: Live born female  Birth Weight:  6 lb 9.3 oz (2985 g) APGAR: 1, 9  Newborn Delivery   Birth date/time:  06/24/2018 10:42:00 Delivery type:  C-Section, Low Transverse Trial of labor:  Yes C-section categorization:  Primary     Baby Feeding: Breast Disposition:home with mother   06/26/2018 Mirian Mo, MD  Midwife attestation I have seen and examined this patient and agree with above documentation in the resident's note.   Cherita ALWILDA GILLAND is a 27 y.o. G2P1011 s/p CS.  Pain is well controlled. Plan for birth control is Depo-Provera. Method of Feeding: breast  PE:  Gen: well appearing Heart: reg rate Lungs: normal WOB Fundus firm Abd: soft, NT, honeycomb c/d/i Ext: no pain, no edema  Recent Labs    06/24/18 1434 06/25/18 0608  HGB 11.6* 10.8*  HCT 34.7* 32.1*     Assessment S/p pLTCS PPD # 2  Plan: - discharge home - postpartum care discussed - f/u clinic in 6 weeks for postpartum visit   Donette Larry, CNM 8:51 PM

## 2018-06-27 ENCOUNTER — Inpatient Hospital Stay (HOSPITAL_COMMUNITY): Admission: RE | Admit: 2018-06-27 | Payer: Medicaid Other | Source: Ambulatory Visit

## 2018-06-27 NOTE — Progress Notes (Signed)
I have reviewed the chart and agree with nursing staff's documentation of this patient's encounter.  Whitfield Bingharlie Tachina Spoonemore, MD 06/27/2018 10:28 AM

## 2018-06-29 ENCOUNTER — Ambulatory Visit (INDEPENDENT_AMBULATORY_CARE_PROVIDER_SITE_OTHER): Payer: Medicaid Other | Admitting: *Deleted

## 2018-06-29 ENCOUNTER — Ambulatory Visit: Payer: Self-pay

## 2018-06-29 DIAGNOSIS — Z4889 Encounter for other specified surgical aftercare: Secondary | ICD-10-CM

## 2018-06-29 NOTE — Lactation Note (Signed)
This note was copied from a baby's chart. 06/29/2018  Name: Taylor Carson MRN: [id number] Date of Birth: 06/24/2016 Gestational Age: Gestational Age: [redacted]w[redacted]d Birth Weight: 105.3 oz Weight today:    6 pounds 14.6 ounces (3134 grams) with clean newborn diaper  Term 5 day old Infant presents today with mom and dad for feeding assessment.   Infant has gained 194 grams in the last 3 days with an average daily weight gain of 64 grams a day.   Mom is attempting to latch infant to the breast. She is using the # 24 NS with each feeding. Infant nurses briefly and then is fed with a bottle of breast milk.    Mom is pumping 4-5 x a day and is getting up to 10 ounces per pumping. Mom is aware of storage guidelines.   Infant latched to the left breast in the football hold. He would actively feed if NS was filled with milk or if 5 french feeding tube applied and pushed slowly. Infant fed for about 10 minutes and then begin spitting up. Infant transferred 19 ml from mom and 15 ml from syringe plus what he spit up. Infant finished feeding with a bottle.   Infant with thick labial frenulum that inserts at the bottom of the gum ridge, infant with divot to the center of the gum ridge. Upper lip tight with flanging and some blanching noted. Infant with good tongue mobility and strong suckling on gloved finger. Infant with clicking on the breast and the NS. Infant unable to sustain latch without the NS. Mom has a gap between her teeth. Will reassess at next feeding.   Mom reports pain at the end of pumping, enc mom to decrease suction as needed. Mom with short shaft nipple with little elasticity to the areola as with most first time moms.   Reviewed paced bottle feeding and using a slower flow nipple than the Avent. Parents report infant paces self well.   Infant to follow up with Pediatrician on Sept 24. [facility] has not contacted mom as of yet. Mom was informed of BF Support Groups. Infant to  follow up with Lactation in about a week, message to clinic to call mom to schedule follow up appt.   Feeding assessment was cut short as mom was in a lot of incisional pain and had a follow up visit with her Doctor today. This LC called Family Medicine Doctor's nurse, Victorino Dike to let them know mom was running late for her appt.   Updated AVS printed and mailed to parents with corrections that were omitted on the first AVS.   Parents report all questions have been answered at this time. Mom to call with any questions/concerns.       General Information: Mother's reason for visit: Feeding assessment Consult: Initial Lactation consultant: Noralee Stain RN,IBCLC Breastfeeding experience: latches for short periods and then supplementing Maternal medical conditions: Thyroid(Hyperthyroidism not needing treatment) Maternal medications: Motrin (ibuprofen)  Breastfeeding History: Frequency of breast feeding: every 2-3 hours Duration of feeding: up to 10 minutes  Supplementation: Supplement method: bottle(Avent)         Breast milk volume: 2.5 ounces Breast milk frequency: every 2-3 hours   Pump type: Medela pump in style Pump frequency: 4-5 x a day Pump volume: 8-10 ounces  Infant Output Assessment: Voids per 24 hours: 8-12 Urine color: Clear yellow Stools per 24 hours: 8-12 Stool color: Yellow  Breast Assessment: Breast: Filling Nipple: Erect, Other(short shaft) Pain level: 2(after  pumping) Pain interventions: Bra, Coconut oil, All purpose nipple cream, Nipple shield  Feeding Assessment: Infant oral assessment: Variance Infant oral assessment comment: Infant with thick labial frenulum that inserts at the bottom of the gum ridge, infant with divot to the center of the gum ridge. Upper lip tight with flanging and some blanching noted. Infant with good tongue mobility and strong suckling on gloved finger. Infant with clicking on the breast and the NS. Infant unable to sustain  latch without the NS.  Positioning: Football(left breast) Latch: 1 - Repeated attempts needed to sustain latch, nipple held in mouth throughout feeding, stimulation needed to elicit sucking reflex. Audible swallowing: 2 - Spontaneous and intermittent Type of nipple: 2 - Everted at rest and after stimulation Comfort: 1 - Filling, red/small blisters or bruises, mild/mod discomfort Hold: 1 - Assistance needed to correctly position infant at breast and maintain latch LATCH score: 7 Latch assessment: Deep Lips flanged: No(upper lip needs flanging) Suck assessment: Displays both Tools: Nipple shield 24 mm, Syringe with 5 Fr feeding tube Pre-feed weight: 3134 grams Post feed weight: 3168 grams Amount transferred: 19 + ml Amount supplemented: 15 ml with 5 french feeding tube at breast  Additional Feeding Assessment:                                    Totals: Total amount transferred: 19 ml Total supplement given: 15 ml + 15 ml from bottle Total amount pumped post feed: did not pump   Plan:  1. Offer infant the breast with feeding cue, limit feeding to 20-30 minutes at the breast if he is not actively feeding 2. Use the # 24 Nipple Shield with feeding as needed 3. Fill nipple shield with milk prior to latch 4. Pump to soften areola before latching as needed 5. Offer infant a bottle of pumped breast milk of about 15-30 ml if he is frantic at the breast and will not latch 6. Offer infant a bottle of pumped breast milk after breast feeding 7. Use a slower flow nipple such as Dr. Theora GianottiBrown's or Medela nipple 8. Use the Paced bottle feeding method with feeding (video on Kelly[employer].com) 9. Infant needs about 56-75 ml (2-2.5 ounces) for 8 feedings a day or 450-600 ml (15-20 ounces) in 24 hours. He may want more or less depending on how often he eats 10. Use the 5 french feeding tube at the breast as needed with feeding 11. Continue pumping 7-8 x a day to protect milk supply. Turn  suction down to a tolerable level 12. Keep up the good work 13. Thank you for allowing me to assist you today 14. Call with any questions/concerns as needed [phone] 15. Follow up with Lactation in 1 week  [facility] Consultant RN, IBCLC                                                      Ed BlalockSharon S Mieka Leaton 06/29/2018, 3:58 PM

## 2018-06-29 NOTE — Progress Notes (Signed)
Pt is here today for a c-section wound check.  She delivered on Saturday and was here today with sutures, steri strips and a "honeycomb bandage" covered by Tegaderm.  Pt is having pain but site is not red, draining or warm to touch.  Dr. Gwendolyn GrantWalden assessed.  Honeycomb bandage removed and steri strips left in place.   Pt instructed that she can get them wet but not to scrub or pull at steri strips.  Advised they would fall off on their own.    Per discharge summary to call for postop in 2 weeks.  [facility] will [facility].  Bevan Vu, Maryjo RochesterJessica Dawn, CMA

## 2018-07-08 ENCOUNTER — Encounter: Payer: Self-pay | Admitting: Family Medicine

## 2018-07-08 NOTE — Telephone Encounter (Signed)
Entered in error

## 2018-07-19 ENCOUNTER — Ambulatory Visit: Payer: Self-pay

## 2018-07-19 NOTE — Lactation Note (Signed)
This note was copied from a baby's chart. 07/19/2018  Name: Taylor Carson MRN: [id number] Date of Birth: 06/24/2016 Gestational Age: Gestational Age: [redacted]w[redacted]d Birth Weight: 105.3 oz Weight today:    9 pounds 5.9 ounces (4250 grams) with clean size 1 diaper  Infant presents today with mom and dad for follow up feeding assessment.   Infant has gained 1116 grams in the last 20 days with an average daily weight gain of 56 grams a day.   Mom is concerned her milk supply has dropped. She is able to feed infant fully and pump 4-5 ounces a day. She has some stored and dad uses some to feed infant a bottle once a day. Mom with history of hyperthyroidism, mom has appt with PCP next week for assessment. Discussed normalcy of milk down regulating.   Infant latched to the left breast with the NS, he fed well and transferred a large volume in about 5 minutes, he then relatched and fed for a while longer. Mom uses the # 24 Nipple shield with feedings. We attempted to latch infant without the NS and was not able to sustain latch, mom with nipple tissue that is not very elastic at this time. Discussed with parents that is infant not able to wean off the NS that he may need to be evaluated by Oral Specialist.   Mom with lump to top of right breast that started Saturday when she went to the fair and did not pump for 4-5 hours. Lump has stayed the same size, there is not pain. Mom reports size has not changed. Reviewed plugged duct care and to let OB know on Friday when she is there for her incision check.   Infant to follow up with Pediatrician on 10/14. Mom aware of BF Support Groups. [facility] has been out to see infant. Mom would like to follow up with Lactation in 2 weeks.     General Information: Mother's reason for visit: follow up feeding assessment Consult: Follow-up Lactation consultant: Noralee Stain RN,IBCLC Breastfeeding experience: latches well, exclusively BF except for 1 bottle  at night Maternal medical conditions: Thyroid(Hyperthyroid, not treated. mom was to have it removed  when she found out she was pregnant) Maternal medications: Pre-natal vitamin  Breastfeeding History: Frequency of breast feeding: every 2-3 hours Duration of feeding: 15-20 minutes  Supplementation: Supplement method: bottle(Medela)         Breast milk volume: 4 ounces Breast milk frequency: 1 x a day Total breast milk volume per day: 4 ounces Pump type: Medela pump in style Pump frequency: 1-2 x a day Pump volume: 4-5 ounces  Infant Output Assessment: Voids per 24 hours: 8-12  Urine color: Clear yellow Stools per 24 hours: 5+ Stool color: Yellow  Breast Assessment: Breast: Filling Nipple: Erect, Other(short shaft) Pain level: 0 Pain interventions: Bra, Coconut oil, Nipple shield  Feeding Assessment: Infant oral assessment: Variance Infant oral assessment comment: Infant with thicxk labial frenulum that inserts at the bottom of the gum ridge, infant with divot to the center of the gum ridge. Upper lip tight with flanging. infant unable to latch to the breast without the nipple shield. Infant with good tongue extension and lateralization. infant wtih some decreased tongue elevation. infant clicks on the breast.  Positioning: Cross cradle(left breast) Latch: 2 - Grasps breast easily, tongue down, lips flanged, rhythmical sucking. Audible swallowing: 2 - Spontaneous and intermittent Type of nipple: 2 - Everted at rest and after stimulation Comfort: 2 - Soft/non-tender Hold:  2 - No assistance needed to correctly position infant at breast LATCH score: 10 Latch assessment: Deep Lips flanged: Yes Suck assessment: Displays both Tools: Nipple shield 24 mm Pre-feed weight: 4250 grams Post feed weight: 4346 grams Amount transferred: 96 ml Amount supplemented: 0  Additional Feeding Assessment:                                    Totals: Total amount  transferred: 96 Total supplement given: 0 Total amount pumped post feed: did not pump   Plan:  1. Offer infant the breast with feeding cues, allow him to feed as long as he wishes 2. Use the # 24 Nipple Shield with feeding as needed, continue to try each day without the nipple shield and stop using if infant able to 3. Empty first breast before offering second breast 4. Use a slower flow nipple such as Dr. Theora Gianotti or Medela nipple 5. Use the Paced bottle feeding method with feeding (video on Kelly[employer].com) 6. Infant needs about 79-105 ml (2.5-3.5 ounces) for 8 feedings a day or 630-840 ml (21-28 ounces) in 24 hours. He may want more or less depending on how often he eats 7. Continue pumping 3-4 x a day to protect milk supply, make sure to pump anytime infant is getting a bottle 8. If infant not willing to wean off the nipple shield may consider having infant evaluated by an Oral Specialist 9. Warm moist compresses to lump to breast for 20 minutes prior to feeding and then feed infant or pump the breast, massage breast with feeding/pumping 10. Let OB know if lump is still present on Friday at your visit 11. Keep up the good work 12. Thank you for allowing me to assist you today 13. Call with any questions/concerns as needed [phone] 14. Follow up with Lactation in 2 weeks   Ed Blalock RN, IBCLC                                                       Ed Blalock 07/19/2018, 3:53 PM

## 2018-07-22 ENCOUNTER — Ambulatory Visit (INDEPENDENT_AMBULATORY_CARE_PROVIDER_SITE_OTHER): Payer: Medicaid Other | Admitting: *Deleted

## 2018-07-22 DIAGNOSIS — Z5189 Encounter for other specified aftercare: Secondary | ICD-10-CM

## 2018-07-22 NOTE — Progress Notes (Signed)
Pt is here today for an incision check of c -section.  She made appt because her d/c summary told her to do so.   Wound approximated well with no redness, pain or drainage.   Pt does mention a "knot in her right breast" but no pain or interference with breastfeeding.  She has an appt on Monday with PCP.  She will mention at that visit. Cloyce Paterson, Maryjo Rochester, CMA

## 2018-07-25 ENCOUNTER — Encounter: Payer: Self-pay | Admitting: Family Medicine

## 2018-07-25 ENCOUNTER — Ambulatory Visit (INDEPENDENT_AMBULATORY_CARE_PROVIDER_SITE_OTHER): Payer: Medicaid Other | Admitting: Family Medicine

## 2018-07-25 ENCOUNTER — Other Ambulatory Visit: Payer: Self-pay

## 2018-07-25 DIAGNOSIS — N63 Unspecified lump in unspecified breast: Secondary | ICD-10-CM | POA: Insufficient documentation

## 2018-07-25 DIAGNOSIS — E049 Nontoxic goiter, unspecified: Secondary | ICD-10-CM | POA: Diagnosis not present

## 2018-07-25 DIAGNOSIS — N6311 Unspecified lump in the right breast, upper outer quadrant: Secondary | ICD-10-CM | POA: Diagnosis not present

## 2018-07-25 NOTE — Assessment & Plan Note (Signed)
Worsened.  Now has medicaid will refer to general surgery for consideration of thyroidectomy

## 2018-07-25 NOTE — Patient Instructions (Signed)
Good to see you today!  Thanks for coming in.  Congrats on your son  Use warm compresses on the breast lump - if gets worse or is not gone in 3 months come back  I will put in another referral and they will contact you.  Come back in 2 months for a pap smear

## 2018-07-25 NOTE — Progress Notes (Signed)
Subjective  Shalawn LEEASIA SECRIST is a 27 y.o. female is presenting with the following  BREAST LUMP For about 2 weeks not changing.  Mildly tender.  No change in breast feeding.  No other lumps or masses under her arms.  No immediate fhx of breast cancer  THYROMEGALY Continues to have feelings of not being able to breath or snoring type sounds when moves next in certain way.  Does not interfere with exercise.  Is swallowing food ok.  Does feel her thyroid is enlarging.  Had negative bx in Oct 2018  Chief Complaint noted Review of Symptoms - see HPI PMH - Smoking status noted.    Objective Vital Signs reviewed BP 108/62   Pulse 68   Temp 98.6 F (37 C) (Oral)   Ht 5\' 4"  (1.626 m)   Wt 187 lb 6.4 oz (85 kg)   LMP 09/13/2017 (Exact Date)   SpO2 98%   BMI 32.17 kg/m   R breast - at 11 oclock a 1 cm firm circular area without fluctuance, mildly tender.  No other axially or breast masses  Thyroid - noticeably enlarged and irregular  Assessments/Plans  See after visit summary for details of patient instuctions  Enlarged thyroid gland Worsened.  Now has medicaid will refer to general surgery for consideration of thyroidectomy   Breast mass Consistent with obstructed milk duct in breast feeding mother.  See after visit summary, No signs of cancer or infection

## 2018-07-25 NOTE — Assessment & Plan Note (Signed)
Consistent with obstructed milk duct in breast feeding mother.  See after visit summary, No signs of cancer or infection

## 2018-09-15 ENCOUNTER — Ambulatory Visit: Payer: Medicaid Other | Admitting: Family Medicine

## 2018-09-21 ENCOUNTER — Other Ambulatory Visit (HOSPITAL_COMMUNITY)
Admission: RE | Admit: 2018-09-21 | Discharge: 2018-09-21 | Disposition: A | Discharge status: Home / Self Care | Payer: Medicaid Other | Source: Ambulatory Visit | Attending: Family Medicine | Admitting: Family Medicine

## 2018-09-21 ENCOUNTER — Ambulatory Visit: Payer: Self-pay | Admitting: Surgery

## 2018-09-21 ENCOUNTER — Ambulatory Visit: Payer: Medicaid Other | Admitting: Family Medicine

## 2018-09-21 ENCOUNTER — Encounter: Payer: Self-pay | Admitting: Family Medicine

## 2018-09-21 VITALS — BP 102/62 | HR 66 | Temp 98.7°F | Ht 64.0 in | Wt 200.4 lb

## 2018-09-21 DIAGNOSIS — F329 Major depressive disorder, single episode, unspecified: Secondary | ICD-10-CM | POA: Diagnosis not present

## 2018-09-21 DIAGNOSIS — N63 Unspecified lump in unspecified breast: Secondary | ICD-10-CM

## 2018-09-21 DIAGNOSIS — Z124 Encounter for screening for malignant neoplasm of cervix: Secondary | ICD-10-CM | POA: Diagnosis not present

## 2018-09-21 DIAGNOSIS — Z309 Encounter for contraceptive management, unspecified: Secondary | ICD-10-CM | POA: Diagnosis not present

## 2018-09-21 DIAGNOSIS — Z3042 Encounter for surveillance of injectable contraceptive: Secondary | ICD-10-CM | POA: Diagnosis not present

## 2018-09-21 DIAGNOSIS — E049 Nontoxic goiter, unspecified: Secondary | ICD-10-CM

## 2018-09-21 DIAGNOSIS — N898 Other specified noninflammatory disorders of vagina: Secondary | ICD-10-CM | POA: Diagnosis not present

## 2018-09-21 DIAGNOSIS — Z3202 Encounter for pregnancy test, result negative: Secondary | ICD-10-CM | POA: Diagnosis not present

## 2018-09-21 DIAGNOSIS — B373 Candidiasis of vulva and vagina: Secondary | ICD-10-CM | POA: Diagnosis not present

## 2018-09-21 DIAGNOSIS — E042 Nontoxic multinodular goiter: Secondary | ICD-10-CM | POA: Diagnosis not present

## 2018-09-21 LAB — POCT WET PREP (WET MOUNT)
CLUE CELLS WET PREP WHIFF POC: NEGATIVE
Trichomonas Wet Prep HPF POC: ABSENT

## 2018-09-21 LAB — POCT URINE PREGNANCY: Preg Test, Ur: NEGATIVE

## 2018-09-21 MED ORDER — FLUCONAZOLE 150 MG PO TABS: 150.0000 mg | ORAL_TABLET | Freq: Once | ORAL | 1 refills | Status: AC

## 2018-09-21 MED ORDER — MEDROXYPROGESTERONE ACETATE 150 MG/ML IM SUSY
150.0000 mg | PREFILLED_SYRINGE | Freq: Once | INTRAMUSCULAR | Status: AC
  Administered 2018-09-21: 150 mg via INTRAMUSCULAR

## 2018-09-21 NOTE — Patient Instructions (Addendum)
Good to see you today!  Thanks for coming in.  We will let you know about your Pap smear  You have a mild yeast infection - use the diflucan once   [facility]  Happy Holidays

## 2018-09-21 NOTE — Assessment & Plan Note (Signed)
Stable.  Being evaluated for surgery

## 2018-09-21 NOTE — Assessment & Plan Note (Signed)
Well controlled off medications.

## 2018-09-21 NOTE — Assessment & Plan Note (Signed)
Resolved

## 2018-09-21 NOTE — Progress Notes (Signed)
Subjective  Taylor Carson is a 27 y.o. female is presenting with the following  THYROMEGALY Saw Dr Gerrit FriendsGerkin.  Has follow up scheduled.   Is gaining weight  BREAST MASS  Has resolved.  No pain or mass or discharge  CONTRACEPTION LMP - mid November.  Wishes to restart Depo.  Had not problems before  VAGINAL DC Since gave birth.  No sores or itching or bleeding outside of menstrual periods  DEPRESSION Feels well.  No depressive thoughts   Chief Complaint noted Review of Symptoms - see HPI PMH - Smoking status noted.    Objective Vital Signs reviewed BP 102/62   Pulse 66   Temp 98.7 F (37.1 C) (Oral)   Ht 5\' 4"  (1.626 m)   Wt 200 lb 6.4 oz (90.9 kg)   SpO2 98%   BMI 34.40 kg/m  Genitalia:  Normal introitus for age, no external lesions, white scant vaginal discharge, mucosa pink and moist, no vaginal or cervical lesions, no vaginal atrophy, no friaility or hemorrhage,   Assessments/Plans  See after visit summary for details of patient instuctions  Breast mass Resolved  Depression Well controlled off medications   Enlarged thyroid gland Stable.  Being evaluated for surgery   Vaginal discharge - mild yeast infection

## 2018-09-22 LAB — CYTOLOGY - PAP
ADEQUACY: ABSENT
DIAGNOSIS: NEGATIVE

## 2018-10-31 NOTE — Patient Instructions (Signed)
JERRILYNN SMALLWOOD  1991-01-18    Your procedure is scheduled on:  11-08-2018   Report to [facility] Main  Entrance, Report to admitting at  5:30 AM    Call this number if you have problems the morning of surgery [phone]    Remember: Do not eat food or drink liquids :After Midnight. BRUSH YOUR TEETH MORNING OF SURGERY AND RINSE YOUR MOUTH OUT, NO CHEWING GUM CANDY OR MINTS.     Take these medicines the morning of surgery with A SIP OF WATER:   None                                  You may not have any metal on your body including hair pins and               piercings  Do not wear jewelry, make-up, lotions, powders or perfumes, deodorant              Do not wear nail polish.  Do not shave  48 hours prior to surgery.                  Do not bring valuables to the hospital. [facility] IS NOT             RESPONSIBLE   FOR VALUABLES.  Contacts, dentures or bridgework may not be worn into surgery.  Leave suitcase in the car. After surgery it may be brought to your room.             _____________________________________________________________________             [facility] - Preparing for Surgery Before surgery, you can play an important role.  Because skin is not sterile, your skin needs to be as free of germs as possible.  You can reduce the number of germs on your skin by washing with CHG (chlorahexidine gluconate) soap before surgery.  CHG is an antiseptic cleaner which kills germs and bonds with the skin to continue killing germs even after washing. Please DO NOT use if you have an allergy to CHG or antibacterial soaps.  If your skin becomes reddened/irritated stop using the CHG and inform your nurse when you arrive at Short Stay. Do not shave (including legs and underarms) for at least 48 hours prior to the first CHG shower.  You may shave your face/neck. Please follow these instructions carefully:  1.  Shower with CHG Soap the night before  surgery and the  morning of Surgery.  2.  If you choose to wash your hair, wash your hair first as usual with your  normal  shampoo.  3.  After you shampoo, rinse your hair and body thoroughly to remove the  shampoo.                            4.  Use CHG as you would any other liquid soap.  You can apply chg directly  to the skin and wash                       Gently with a scrungie or clean washcloth.  5.  Apply the CHG Soap to your body ONLY FROM THE NECK DOWN.   Do not use on face/ open  Wound or open sores. Avoid contact with eyes, ears mouth and genitals (private parts).                       Wash face,  Genitals (private parts) with your normal soap.             6.  Wash thoroughly, paying special attention to the area where your surgery  will be performed.  7.  Thoroughly rinse your body with warm water from the neck down.  8.  DO NOT shower/wash with your normal soap after using and rinsing off  the CHG Soap.             9.  Pat yourself dry with a clean towel.            10.  Wear clean pajamas.            11.  Place clean sheets on your bed the night of your first shower and do not  sleep with pets. Day of Surgery : Do not apply any lotions/deodorants the morning of surgery.  Please wear clean clothes to the hospital/surgery center.  FAILURE TO FOLLOW THESE INSTRUCTIONS MAY RESULT IN THE CANCELLATION OF YOUR SURGERY PATIENT SIGNATURE_________________________________  NURSE SIGNATURE__________________________________  ________________________________________________________________________

## 2018-11-01 ENCOUNTER — Encounter (HOSPITAL_COMMUNITY): Payer: Self-pay

## 2018-11-01 ENCOUNTER — Encounter (HOSPITAL_COMMUNITY)
Admission: RE | Admit: 2018-11-01 | Discharge: 2018-11-01 | Disposition: A | Discharge status: Home / Self Care | Payer: Medicaid Other | Source: Ambulatory Visit | Attending: Surgery | Admitting: Surgery

## 2018-11-01 ENCOUNTER — Ambulatory Visit (HOSPITAL_COMMUNITY)
Admission: RE | Admit: 2018-11-01 | Discharge: 2018-11-01 | Disposition: A | Discharge status: Home / Self Care | Payer: Medicaid Other | Source: Ambulatory Visit | Attending: Anesthesiology | Admitting: Anesthesiology

## 2018-11-01 ENCOUNTER — Other Ambulatory Visit: Payer: Self-pay

## 2018-11-01 DIAGNOSIS — Z01818 Encounter for other preprocedural examination: Secondary | ICD-10-CM

## 2018-11-01 DIAGNOSIS — E049 Nontoxic goiter, unspecified: Secondary | ICD-10-CM | POA: Diagnosis not present

## 2018-11-01 HISTORY — DX: Personal history of traumatic brain injury: Z87.820

## 2018-11-01 HISTORY — DX: Dyspnea, unspecified: R06.00

## 2018-11-01 HISTORY — DX: Hypothyroidism, unspecified: E03.9

## 2018-11-01 HISTORY — DX: Nontoxic multinodular goiter: E04.2

## 2018-11-01 HISTORY — DX: Personal history of other complications of pregnancy, childbirth and the puerperium: Z87.59

## 2018-11-01 LAB — CBC
HEMATOCRIT: 45 % (ref 36.0–46.0)
Hemoglobin: 14.1 g/dL (ref 12.0–15.0)
MCH: 27.5 pg (ref 26.0–34.0)
MCHC: 31.3 g/dL (ref 30.0–36.0)
MCV: 87.7 fL (ref 80.0–100.0)
NRBC: 0 % (ref 0.0–0.2)
PLATELETS: 310 10*3/uL (ref 150–400)
RBC: 5.13 MIL/uL — ABNORMAL HIGH (ref 3.87–5.11)
RDW: 12.9 % (ref 11.5–15.5)
WBC: 4.1 10*3/uL (ref 4.0–10.5)

## 2018-11-01 LAB — HCG, SERUM, QUALITATIVE: PREG SERUM: NEGATIVE

## 2018-11-01 MED ORDER — CHLORHEXIDINE GLUCONATE CLOTH 2 % EX PADS
6.0000 | MEDICATED_PAD | Freq: Once | CUTANEOUS | Status: DC
  Filled 2018-11-01: qty 6

## 2018-11-07 ENCOUNTER — Encounter (HOSPITAL_COMMUNITY): Payer: Self-pay | Admitting: Surgery

## 2018-11-07 DIAGNOSIS — E042 Nontoxic multinodular goiter: Secondary | ICD-10-CM | POA: Diagnosis present

## 2018-11-07 NOTE — H&P (Signed)
General Surgery [facility]  Taylor Carson DOB: 11/28/1990 Single / Language: [place] / Race: Black or African American Female   History of Present Illness   The patient is a 28 year old female who presents with a complaint of Enlarged thyroid.  CHIEF COMPLAINT: multinodular thyroid goiter with compressive symptoms  Patient is referred by Dr. Pearlean BrownieMarshall Chambliss for surgical evaluation and management of enlarging multinodular thyroid goiter with compressive symptoms. Patient was initially diagnosed approximately 2 years ago. This was an incidental finding following a CT scan for concussion. Subsequently the thyroid has continued to enlarge. In 2018 she underwent thyroid ultrasound demonstrated an enlarged thyroid gland with the right lobe measuring 6.7 cm and left lobe measuring 8.3 cm. There were multiple nodules bilaterally. Patient underwent ultrasound-guided fine-needle aspiration biopsy of the dominant nodules on the left and the right in October 2018. Both of these samples were benign. TSH level is normal at 1.394 in September 2019. Patient has continued to note enlargement of the thyroid gland. She is developing mild compressive symptoms including airway compression and shortness of breath. She denies any significant pain but does have occasional discomfort. She denies any dysphagia. She denies tremors or palpitations. There is a family history of thyroid disease in the patient's paternal grandmother. She takes thyroid medication. Ears no family history of thyroid malignancy or other endocrine neoplasm. Patient is accompanied today by her husband and young baby.   Past Surgical History  Cesarean Section - 1   Diagnostic Studies History  Mammogram  >3 years ago Pap Smear  1-5 years ago  Allergies No Known Drug Allergies [09/21/2018]: Allergies Reconciled   Medication History  No Current Medications Medications Reconciled  Social  History Alcohol use  Occasional alcohol use. Illicit drug use  Remotely quit drug use. No caffeine use  Tobacco use  Current some day smoker.  Family History Heart disease in female family member before age 26   Pregnancy / Birth History Age at menarche  13 years. Contraceptive History  Depo-provera. Gravida  2 Length (months) of breastfeeding  3-6 Maternal age  26-25 Para  1 Regular periods   Other Problems Anxiety Disorder  Depression  Thyroid Disease   Review of Systems General Not Present- Appetite Loss, Chills, Fatigue, Fever, Night Sweats, Weight Gain and Weight Loss. Skin Not Present- Change in Wart/Mole, Dryness, Hives, Jaundice, New Lesions, Non-Healing Wounds, Rash and Ulcer. HEENT Not Present- Earache, Hearing Loss, Hoarseness, Nose Bleed, Oral Ulcers, Ringing in the Ears, Seasonal Allergies, Sinus Pain, Sore Throat, Visual Disturbances, Wears glasses/contact lenses and Yellow Eyes. Respiratory Present- Difficulty Breathing and Wheezing. Not Present- Bloody sputum, Chronic Cough and Snoring. Cardiovascular Present- Difficulty Breathing Lying Down and Shortness of Breath. Not Present- Chest Pain, Leg Cramps, Palpitations, Rapid Heart Rate and Swelling of Extremities. Gastrointestinal Not Present- Abdominal Pain, Bloating, Bloody Stool, Change in Bowel Habits, Chronic diarrhea, Constipation, Difficulty Swallowing, Excessive gas, Gets full quickly at meals, Hemorrhoids, Indigestion, Nausea, Rectal Pain and Vomiting. Female Genitourinary Not Present- Frequency, Nocturia, Painful Urination, Pelvic Pain and Urgency. Psychiatric Present- Anxiety. Not Present- Bipolar, Change in Sleep Pattern, Depression, Fearful and Frequent crying.  Vitals Weight: 199.13 lb Height: 64in Body Surface Area: 1.95 m Body Mass Index: 34.18 kg/m  Temp.: 97.52F  Pulse: 76 (Regular)  P.OX: 99% (Room air) BP: 102/68 (Sitting, Left Arm, Standard)  Physical Exam  See  vital signs recorded above  GENERAL APPEARANCE Development: normal Nutritional status: normal Gross deformities: none  SKIN Rash, lesions,  ulcers: none Induration, erythema: none Nodules: none palpable  EYES Conjunctiva and lids: normal Pupils: equal and reactive Iris: normal bilaterally  EARS, NOSE, MOUTH, THROAT External ears: no lesion or deformity External nose: no lesion or deformity Hearing: grossly normal Lips: no lesion or deformity Dentition: normal for age Oral mucosa: moist  NECK Symmetric: no Trachea: midline Thyroid: Moderately to markedly enlarged thyroid, left lobe greater than right; entire thyroid gland is nodular without discrete or dominant mass. Thyroid parenchyma is relatively soft. No palpable lymphadenopathy. Voice quality is normal.  CHEST Respiratory effort: normal Retraction or accessory muscle use: no Breath sounds: normal bilaterally Rales, rhonchi, wheeze: none  CARDIOVASCULAR Auscultation: regular rhythm, normal rate Murmurs: none Pulses: carotid and radial pulse 2+ palpable Lower extremity edema: none Lower extremity varicosities: none  MUSCULOSKELETAL Station and gait: normal Digits and nails: no clubbing or cyanosis Muscle strength: grossly normal all extremities Range of motion: grossly normal all extremities Deformity: none  LYMPHATIC Cervical: none palpable Supraclavicular: none palpable  PSYCHIATRIC Oriented to person, place, and time: yes Mood and affect: normal for situation Judgment and insight: appropriate for situation    Assessment & Plan  ENLARGED THYROID (E04.9) MULTIPLE THYROID NODULES (E04.2)  Pt Education - Pamphlet Given - The Thyroid Book: discussed with patient and provided information.  Patient presents with an enlarging multinodular thyroid goiter which is causing early compressive symptoms. She is accompanied by her husband. They are provided with written literature on thyroid surgery to review  at home.  After review of her clinical history, her ultrasound, her biopsy results, and her laboratory studies, I have recommended proceeding with total thyroidectomy. We have discussed the procedure at length. We have discussed other options for management. We have discussed potential complications from surgery including recurrent laryngeal nerve injury and injury to parathyroid glands. We have discussed the location and size of the surgical incision. We have discussed the hospital stay to be anticipated. We have discussed her postoperative recovery and return to activities. We have discussed the fact that she is breast-feeding and we'll need to pump and discard breast milk for approximately 24 hours following surgery. Patient understands and wishes to proceed with surgery in the near future.  The risks and benefits of the procedure have been discussed at length with the patient. The patient understands the proposed procedure, potential alternative treatments, and the course of recovery to be expected. All of the patient's questions have been answered at this time. The patient wishes to proceed with surgery.   [name], MD [facility] Surgery Office: [phone]

## 2018-11-08 ENCOUNTER — Ambulatory Visit (HOSPITAL_COMMUNITY): Payer: Medicaid Other | Admitting: Certified Registered Nurse Anesthetist

## 2018-11-08 ENCOUNTER — Encounter (HOSPITAL_COMMUNITY): Payer: Self-pay | Admitting: Emergency Medicine

## 2018-11-08 ENCOUNTER — Other Ambulatory Visit: Payer: Self-pay

## 2018-11-08 ENCOUNTER — Observation Stay (HOSPITAL_COMMUNITY)
Admission: RE | Admit: 2018-11-08 | Discharge: 2018-11-09 | Disposition: A | Discharge status: Home / Self Care | Payer: Medicaid Other | Attending: Surgery | Admitting: Surgery

## 2018-11-08 ENCOUNTER — Ambulatory Visit (HOSPITAL_COMMUNITY): Payer: Medicaid Other | Admitting: Physician Assistant

## 2018-11-08 ENCOUNTER — Encounter (HOSPITAL_COMMUNITY)
Admission: RE | Disposition: A | Discharge status: Home / Self Care | Payer: Self-pay | Source: Home / Self Care | Attending: Surgery

## 2018-11-08 DIAGNOSIS — E042 Nontoxic multinodular goiter: Secondary | ICD-10-CM | POA: Diagnosis not present

## 2018-11-08 DIAGNOSIS — E049 Nontoxic goiter, unspecified: Secondary | ICD-10-CM | POA: Diagnosis present

## 2018-11-08 DIAGNOSIS — E063 Autoimmune thyroiditis: Secondary | ICD-10-CM | POA: Diagnosis not present

## 2018-11-08 HISTORY — PX: THYROIDECTOMY: SHX17

## 2018-11-08 SURGERY — THYROIDECTOMY
Anesthesia: General

## 2018-11-08 MED ORDER — LEVOTHYROXINE SODIUM 88 MCG PO TABS
88.0000 ug | ORAL_TABLET | Freq: Every day | ORAL | Status: DC
  Administered 2018-11-09: 88 ug via ORAL
  Filled 2018-11-08: qty 1

## 2018-11-08 MED ORDER — CEFAZOLIN SODIUM-DEXTROSE 2-4 GM/100ML-% IV SOLN
2.0000 g | INTRAVENOUS | Status: AC
  Administered 2018-11-08: 2 g via INTRAVENOUS
  Filled 2018-11-08: qty 100

## 2018-11-08 MED ORDER — FENTANYL CITRATE (PF) 250 MCG/5ML IJ SOLN
INTRAMUSCULAR | Status: AC
  Filled 2018-11-08: qty 5

## 2018-11-08 MED ORDER — PROPOFOL 10 MG/ML IV BOLUS
INTRAVENOUS | Status: DC | PRN
  Administered 2018-11-08: 200 mg via INTRAVENOUS

## 2018-11-08 MED ORDER — ONDANSETRON 4 MG PO TBDP: 4.0000 mg | ORAL_TABLET | Freq: Four times a day (QID) | ORAL | Status: DC | PRN

## 2018-11-08 MED ORDER — HYDROCODONE-ACETAMINOPHEN 5-325 MG PO TABS
1.0000 | ORAL_TABLET | ORAL | Status: DC | PRN
  Administered 2018-11-08 – 2018-11-09 (×4): 2 via ORAL
  Filled 2018-11-08 (×4): qty 2

## 2018-11-08 MED ORDER — CALCIUM CARBONATE 1250 (500 CA) MG PO TABS
2.0000 | ORAL_TABLET | Freq: Three times a day (TID) | ORAL | Status: DC
  Administered 2018-11-08 – 2018-11-09 (×3): 1000 mg via ORAL
  Filled 2018-11-08 (×3): qty 1

## 2018-11-08 MED ORDER — SUGAMMADEX SODIUM 200 MG/2ML IV SOLN
INTRAVENOUS | Status: AC
  Filled 2018-11-08: qty 2

## 2018-11-08 MED ORDER — TRAMADOL HCL 50 MG PO TABS: 50.0000 mg | ORAL_TABLET | Freq: Four times a day (QID) | ORAL | Status: DC | PRN

## 2018-11-08 MED ORDER — LIDOCAINE 2% (20 MG/ML) 5 ML SYRINGE
INTRAMUSCULAR | Status: DC | PRN
  Administered 2018-11-08: 80 mg via INTRAVENOUS
  Administered 2018-11-08: 40 mg via INTRAVENOUS

## 2018-11-08 MED ORDER — FENTANYL CITRATE (PF) 250 MCG/5ML IJ SOLN
INTRAMUSCULAR | Status: DC | PRN
  Administered 2018-11-08 (×2): 50 ug via INTRAVENOUS
  Administered 2018-11-08: 100 ug via INTRAVENOUS
  Administered 2018-11-08: 50 ug via INTRAVENOUS

## 2018-11-08 MED ORDER — PROPOFOL 10 MG/ML IV BOLUS
INTRAVENOUS | Status: AC
  Filled 2018-11-08: qty 20

## 2018-11-08 MED ORDER — MIDAZOLAM HCL 2 MG/2ML IJ SOLN
INTRAMUSCULAR | Status: DC | PRN
  Administered 2018-11-08: 2 mg via INTRAVENOUS

## 2018-11-08 MED ORDER — ACETAMINOPHEN 325 MG PO TABS: 650.0000 mg | ORAL_TABLET | Freq: Four times a day (QID) | ORAL | Status: DC | PRN

## 2018-11-08 MED ORDER — ROCURONIUM BROMIDE 100 MG/10ML IV SOLN
INTRAVENOUS | Status: AC
  Filled 2018-11-08: qty 1

## 2018-11-08 MED ORDER — ACETAMINOPHEN 10 MG/ML IV SOLN
1000.0000 mg | Freq: Once | INTRAVENOUS | Status: DC | PRN
  Administered 2018-11-08: 1000 mg via INTRAVENOUS

## 2018-11-08 MED ORDER — HYDROMORPHONE HCL 1 MG/ML IJ SOLN
0.2500 mg | INTRAMUSCULAR | Status: DC | PRN
  Administered 2018-11-08 (×2): 0.5 mg via INTRAVENOUS

## 2018-11-08 MED ORDER — 0.9 % SODIUM CHLORIDE (POUR BTL) OPTIME
TOPICAL | Status: DC | PRN
  Administered 2018-11-08: 1000 mL

## 2018-11-08 MED ORDER — PHENYLEPHRINE 40 MCG/ML (10ML) SYRINGE FOR IV PUSH (FOR BLOOD PRESSURE SUPPORT)
PREFILLED_SYRINGE | INTRAVENOUS | Status: AC
  Filled 2018-11-08: qty 10

## 2018-11-08 MED ORDER — ONDANSETRON HCL 4 MG/2ML IJ SOLN
INTRAMUSCULAR | Status: DC | PRN
  Administered 2018-11-08: 4 mg via INTRAVENOUS

## 2018-11-08 MED ORDER — SUGAMMADEX SODIUM 500 MG/5ML IV SOLN
INTRAVENOUS | Status: AC
  Filled 2018-11-08: qty 5

## 2018-11-08 MED ORDER — KCL IN DEXTROSE-NACL 20-5-0.45 MEQ/L-%-% IV SOLN
INTRAVENOUS | Status: DC
  Administered 2018-11-08 – 2018-11-09 (×2): via INTRAVENOUS
  Filled 2018-11-08 (×2): qty 1000

## 2018-11-08 MED ORDER — ONDANSETRON HCL 4 MG/2ML IJ SOLN
INTRAMUSCULAR | Status: AC
  Filled 2018-11-08: qty 2

## 2018-11-08 MED ORDER — ROCURONIUM BROMIDE 10 MG/ML (PF) SYRINGE
PREFILLED_SYRINGE | INTRAVENOUS | Status: DC | PRN
  Administered 2018-11-08 (×2): 10 mg via INTRAVENOUS
  Administered 2018-11-08: 50 mg via INTRAVENOUS

## 2018-11-08 MED ORDER — PHENYLEPHRINE 40 MCG/ML (10ML) SYRINGE FOR IV PUSH (FOR BLOOD PRESSURE SUPPORT)
PREFILLED_SYRINGE | INTRAVENOUS | Status: DC | PRN
  Administered 2018-11-08: 80 ug via INTRAVENOUS
  Administered 2018-11-08: 40 ug via INTRAVENOUS
  Administered 2018-11-08 (×2): 80 ug via INTRAVENOUS

## 2018-11-08 MED ORDER — DEXAMETHASONE SODIUM PHOSPHATE 10 MG/ML IJ SOLN
INTRAMUSCULAR | Status: AC
  Filled 2018-11-08: qty 1

## 2018-11-08 MED ORDER — ACETAMINOPHEN 650 MG RE SUPP: 650.0000 mg | Freq: Four times a day (QID) | RECTAL | Status: DC | PRN

## 2018-11-08 MED ORDER — DEXAMETHASONE SODIUM PHOSPHATE 10 MG/ML IJ SOLN
INTRAMUSCULAR | Status: DC | PRN
  Administered 2018-11-08: 8 mg via INTRAVENOUS

## 2018-11-08 MED ORDER — PROMETHAZINE HCL 25 MG/ML IJ SOLN: 6.2500 mg | INTRAMUSCULAR | Status: DC | PRN

## 2018-11-08 MED ORDER — ONDANSETRON HCL 4 MG/2ML IJ SOLN
4.0000 mg | Freq: Four times a day (QID) | INTRAMUSCULAR | Status: DC | PRN
  Administered 2018-11-08: 4 mg via INTRAVENOUS
  Filled 2018-11-08: qty 2

## 2018-11-08 MED ORDER — LIDOCAINE 2% (20 MG/ML) 5 ML SYRINGE
INTRAMUSCULAR | Status: AC
  Filled 2018-11-08: qty 5

## 2018-11-08 MED ORDER — ACETAMINOPHEN 10 MG/ML IV SOLN
INTRAVENOUS | Status: AC
  Filled 2018-11-08: qty 100

## 2018-11-08 MED ORDER — HYDROMORPHONE HCL 1 MG/ML IJ SOLN
INTRAMUSCULAR | Status: AC
  Administered 2018-11-08: 0.5 mg via INTRAVENOUS
  Filled 2018-11-08: qty 1

## 2018-11-08 MED ORDER — SUGAMMADEX SODIUM 200 MG/2ML IV SOLN
INTRAVENOUS | Status: DC | PRN
  Administered 2018-11-08: 200 mg via INTRAVENOUS

## 2018-11-08 MED ORDER — LACTATED RINGERS IV SOLN
INTRAVENOUS | Status: DC
  Administered 2018-11-08: 06:00:00 via INTRAVENOUS

## 2018-11-08 MED ORDER — MIDAZOLAM HCL 2 MG/2ML IJ SOLN
INTRAMUSCULAR | Status: AC
  Filled 2018-11-08: qty 2

## 2018-11-08 MED ORDER — HYDROMORPHONE HCL 1 MG/ML IJ SOLN: 1.0000 mg | INTRAMUSCULAR | Status: DC | PRN

## 2018-11-08 SURGICAL SUPPLY — 34 items
ADH SKN CLS APL DERMABOND .7 (GAUZE/BANDAGES/DRESSINGS) ×1
ATTRACTOMAT 16X20 MAGNETIC DRP (DRAPES) ×3 IMPLANT
BLADE SURG 15 STRL LF DISP TIS (BLADE) ×1 IMPLANT
BLADE SURG 15 STRL SS (BLADE) ×3
CHLORAPREP W/TINT 26ML (MISCELLANEOUS) ×6 IMPLANT
CLIP VESOCCLUDE MED 6/CT (CLIP) ×10 IMPLANT
CLIP VESOCCLUDE SM WIDE 6/CT (CLIP) ×6 IMPLANT
COVER SURGICAL LIGHT HANDLE (MISCELLANEOUS) ×3 IMPLANT
COVER WAND RF STERILE (DRAPES) ×3 IMPLANT
DERMABOND ADVANCED (GAUZE/BANDAGES/DRESSINGS) ×2
DERMABOND ADVANCED .7 DNX12 (GAUZE/BANDAGES/DRESSINGS) IMPLANT
DRAPE LAPAROTOMY T 98X78 PEDS (DRAPES) ×3 IMPLANT
ELECT PENCIL ROCKER SW 15FT (MISCELLANEOUS) ×3 IMPLANT
ELECT REM PT RETURN 15FT ADLT (MISCELLANEOUS) ×3 IMPLANT
GAUZE 4X4 16PLY RFD (DISPOSABLE) ×5 IMPLANT
GLOVE BIOGEL PI IND STRL 7.0 (GLOVE) IMPLANT
GLOVE BIOGEL PI IND STRL 7.5 (GLOVE) IMPLANT
GLOVE BIOGEL PI INDICATOR 7.0 (GLOVE) ×4
GLOVE BIOGEL PI INDICATOR 7.5 (GLOVE) ×8
GLOVE SURG ORTHO 8.0 STRL STRW (GLOVE) ×3 IMPLANT
GOWN STRL REUS W/ TWL LRG LVL3 (GOWN DISPOSABLE) IMPLANT
GOWN STRL REUS W/TWL LRG LVL3 (GOWN DISPOSABLE) ×3
GOWN STRL REUS W/TWL XL LVL3 (GOWN DISPOSABLE) ×8 IMPLANT
HEMOSTAT SURGICEL 2X4 FIBR (HEMOSTASIS) ×2 IMPLANT
ILLUMINATOR WAVEGUIDE N/F (MISCELLANEOUS) ×2 IMPLANT
KIT BASIN OR (CUSTOM PROCEDURE TRAY) ×3 IMPLANT
PACK BASIC VI WITH GOWN DISP (CUSTOM PROCEDURE TRAY) ×3 IMPLANT
SHEARS HARMONIC 9CM CVD (BLADE) ×3 IMPLANT
SUT MNCRL AB 4-0 PS2 18 (SUTURE) ×3 IMPLANT
SUT VIC AB 3-0 SH 18 (SUTURE) ×6 IMPLANT
SYR BULB IRRIGATION 50ML (SYRINGE) ×3 IMPLANT
TOWEL OR 17X26 10 PK STRL BLUE (TOWEL DISPOSABLE) ×3 IMPLANT
TOWEL OR NON WOVEN STRL DISP B (DISPOSABLE) ×3 IMPLANT
YANKAUER SUCT BULB TIP 10FT TU (MISCELLANEOUS) ×3 IMPLANT

## 2018-11-08 NOTE — Anesthesia Procedure Notes (Signed)
Procedure Name: Intubation Date/Time: 11/08/2018 7:36 AM Performed by: Niel Hummer, CRNA Pre-anesthesia Checklist: Patient being monitored, Suction available, Emergency Drugs available and Patient identified Patient Re-evaluated:Patient Re-evaluated prior to induction Oxygen Delivery Method: Circle system utilized Preoxygenation: Pre-oxygenation with 100% oxygen Induction Type: IV induction Ventilation: Mask ventilation without difficulty and Oral airway inserted - appropriate to patient size Laryngoscope Size: Mac and 4 Grade View: Grade I Tube type: Oral Tube size: 7.0 mm Number of attempts: 2 Airway Equipment and Method: Bougie stylet Placement Confirmation: ETT inserted through vocal cords under direct vision,  positive ETCO2 and breath sounds checked- equal and bilateral Secured at: 23 cm Tube secured with: Tape Dental Injury: Teeth and Oropharynx as per pre-operative assessment  Comments: DL attempt x1, grade 2b view, unable to place tube with bougie. Attempt by MDA, successful placement with bougie.

## 2018-11-08 NOTE — Interval H&P Note (Signed)
History and Physical Interval Note:  11/08/2018 7:10 AM  Taylor Carson  has presented today for surgery, with the diagnosis of MULTINODULER THYROID GOITER  The various methods of treatment have been discussed with the patient and family. After consideration of risks, benefits and other options for treatment, the patient has consented to  Procedure(s): TOTAL THYROIDECTOMY (N/A) as a surgical intervention .  The patient's history has been reviewed, patient examined, no change in status, stable for surgery.  I have reviewed the patient's chart and labs.  Questions were answered to the patient's satisfaction.     [name]

## 2018-11-08 NOTE — Anesthesia Preprocedure Evaluation (Signed)
Anesthesia Evaluation  Patient identified by MRN, date of birth, ID band Patient awake    Reviewed: Allergy & Precautions, NPO status , Patient's Chart, lab work & pertinent test results  Airway Mallampati: II  TM Distance: >3 FB Neck ROM: Full    Dental no notable dental hx.    Pulmonary neg pulmonary ROS, former smoker,    Pulmonary exam normal breath sounds clear to auscultation       Cardiovascular negative cardio ROS Normal cardiovascular exam Rhythm:Regular Rate:Normal     Neuro/Psych negative neurological ROS  negative psych ROS   GI/Hepatic negative GI ROS, Neg liver ROS,   Endo/Other  Hypothyroidism   Renal/GU negative Renal ROS  negative genitourinary   Musculoskeletal negative musculoskeletal ROS (+)   Abdominal   Peds negative pediatric ROS (+)  Hematology negative hematology ROS (+)   Anesthesia Other Findings   Reproductive/Obstetrics negative OB ROS                             Anesthesia Physical Anesthesia Plan  ASA: II  Anesthesia Plan: General   Post-op Pain Management:    Induction: Intravenous  PONV Risk Score and Plan: 3 and Ondansetron, Dexamethasone, Treatment may vary due to age or medical condition and Midazolam  Airway Management Planned: Oral ETT  Additional Equipment:   Intra-op Plan:   Post-operative Plan: Extubation in OR  Informed Consent: I have reviewed the patients History and Physical, chart, labs and discussed the procedure including the risks, benefits and alternatives for the proposed anesthesia with the patient or authorized representative who has indicated his/her understanding and acceptance.     Dental advisory given  Plan Discussed with: CRNA and Surgeon  Anesthesia Plan Comments:         Anesthesia Quick Evaluation

## 2018-11-08 NOTE — Transfer of Care (Signed)
Immediate Anesthesia Transfer of Care Note  Patient: Taylor Carson  Procedure(s) Performed: TOTAL THYROIDECTOMY (N/A )  Patient Location: PACU  Anesthesia Type:General  Level of Consciousness: awake, alert  and oriented  Airway & Oxygen Therapy: Patient Spontanous Breathing and Patient connected to face mask oxygen  Post-op Assessment: Report given to RN and Post -op Vital signs reviewed and stable  Post vital signs: Reviewed and stable  Last Vitals:  Vitals Value Taken Time  BP    Temp    Pulse 95 11/08/2018  9:39 AM  Resp 11 11/08/2018  9:39 AM  SpO2 99 % 11/08/2018  9:39 AM  Vitals shown include unvalidated device data.  Last Pain:  Vitals:   11/08/18 0550  TempSrc:   PainSc: 0-No pain      Patients Stated Pain Goal: 4 (11/08/18 0550)  Complications: No apparent anesthesia complications

## 2018-11-08 NOTE — Plan of Care (Signed)
Pt alert and oriented, doing well post op.  Pain well controlled.  Ice applied to neck. RN will monitor.

## 2018-11-08 NOTE — Anesthesia Postprocedure Evaluation (Signed)
Anesthesia Post Note  Patient: Taylor Carson  Procedure(s) Performed: TOTAL THYROIDECTOMY (N/A )     Patient location during evaluation: PACU Anesthesia Type: General Level of consciousness: awake and alert Pain management: pain level controlled Vital Signs Assessment: post-procedure vital signs reviewed and stable Respiratory status: spontaneous breathing, nonlabored ventilation, respiratory function stable and patient connected to nasal cannula oxygen Cardiovascular status: blood pressure returned to baseline and stable Postop Assessment: no apparent nausea or vomiting Anesthetic complications: no    Last Vitals:  Vitals:   11/08/18 1156 11/08/18 1239  BP: 117/77 120/77  Pulse: 83 87  Resp: 14 16  Temp: 36.6 C 36.7 C  SpO2: 100% 99%    Last Pain:  Vitals:   11/08/18 1239  TempSrc: Oral  PainSc:                  Jaiveer Panas S

## 2018-11-08 NOTE — Op Note (Signed)
Procedure Note  Pre-operative Diagnosis:  Multinodular thyroid goiter with compressive symptoms  Post-operative Diagnosis:  same  Surgeon:  Darnell Levelodd Everlina Gotts, MD  Assistant:  George HughMisty Tuttle   Procedure:  Total thyroidectomy  Anesthesia:  General  Estimated Blood Loss:  minimal  Drains: none         Specimen: thyroid to pathology  Indications:  Patient is referred by Dr. Pearlean BrownieMarshall Chambliss for surgical evaluation and management of enlarging multinodular thyroid goiter with compressive symptoms. Patient was initially diagnosed approximately 2 years ago. This was an incidental finding following a CT scan for concussion. Subsequently the thyroid has continued to enlarge. In 2018 she underwent thyroid ultrasound demonstrated an enlarged thyroid gland with the right lobe measuring 6.7 cm and left lobe measuring 8.3 cm. There were multiple nodules bilaterally. Patient underwent ultrasound-guided fine-needle aspiration biopsy of the dominant nodules on the left and the right in October 2018. Both of these samples were benign. TSH level is normal at 1.394 in September 2019. Patient has continued to note enlargement of the thyroid gland. She is developing mild compressive symptoms including airway compression and shortness of breath.  Procedure Details: [facility] The patient was brought to the operating room and placed in a supine position on the operating room table. Following administration of general anesthesia, the patient was positioned and then prepped and draped in the usual aseptic fashion. After ascertaining that an adequate level of anesthesia had been achieved, a small Kocher incision was made with #15 blade. Dissection was carried through subcutaneous tissues and platysma.Hemostasis was achieved with the electrocautery. Skin flaps were elevated cephalad and caudad from the thyroid notch to the sternal notch. A Mahorner self-retaining retractor  was placed for exposure. Strap muscles were incised in the midline and dissection was begun on the left side.  Strap muscles were reflected laterally.  Left thyroid lobe was markedly enlarged and multinodular.  It extended into the anterior mediastinum.  The left lobe was gently mobilized with blunt dissection. Superior pole vessels were dissected out and divided individually between small and medium ligaclips with the harmonic scalpel. The thyroid lobe was rolled anteriorly. Branches of the inferior thyroid artery were divided between small ligaclips with the harmonic scalpel. Inferior venous tributaries were divided between ligaclips. Both the superior and inferior parathyroid glands were identified and preserved on their vascular pedicles. The recurrent laryngeal nerve was identified and preserved along its course. The ligament of Allyson SabalBerry was released with the electrocautery and the gland was mobilized onto the anterior trachea. Isthmus was mobilized across the midline. There was a large pyramidal lobe present which was dissected off of the thyroid cartilage and resected en bloc with the isthmus. Dry pack was placed in the left neck.  The right thyroid lobe was gently mobilized with blunt dissection. Right thyroid lobe was moderately enlarged and multinodular. Superior pole vessels were dissected out and divided between small and medium ligaclips with the Harmonic scalpel. Superior parathyroid was identified and preserved. Inferior venous tributaries were divided between medium ligaclips with the harmonic scalpel. The right thyroid lobe was rolled anteriorly and the branches of the inferior thyroid artery divided between small ligaclips. The right recurrent laryngeal nerve was identified and preserved along its course. The ligament of Allyson SabalBerry was released with the electrocautery. The right thyroid lobe was mobilized onto the anterior trachea and the remainder of the thyroid was dissected off the anterior trachea  and the thyroid was completely excised. A  suture was used to mark the left lobe. The entire thyroid gland was submitted to pathology for review.  The neck was irrigated with warm saline. Fibrillar was placed throughout the operative field. Strap muscles were approximated in the midline with interrupted 3-0 Vicryl sutures. Platysma was closed with interrupted 3-0 Vicryl sutures. Skin was closed with a running 4-0 Monocryl subcuticular suture. Wound was washed and Dermabond was applied. The patient was awakened from anesthesia and brought to the recovery room. The patient tolerated the procedure well.   [name], MD [facility] Office: [phone]

## 2018-11-09 ENCOUNTER — Encounter (HOSPITAL_COMMUNITY): Payer: Self-pay | Admitting: Surgery

## 2018-11-09 DIAGNOSIS — E063 Autoimmune thyroiditis: Secondary | ICD-10-CM | POA: Diagnosis not present

## 2018-11-09 DIAGNOSIS — E042 Nontoxic multinodular goiter: Secondary | ICD-10-CM | POA: Diagnosis not present

## 2018-11-09 LAB — BASIC METABOLIC PANEL
Anion gap: 9 (ref 5–15)
BUN: 8 mg/dL (ref 6–20)
CO2: 21 mmol/L — ABNORMAL LOW (ref 22–32)
Calcium: 9.1 mg/dL (ref 8.9–10.3)
Chloride: 108 mmol/L (ref 98–111)
Creatinine, Ser: 0.94 mg/dL (ref 0.44–1.00)
GFR calc Af Amer: >60 mL/min (ref >60)
GFR calc non Af Amer: >60 mL/min (ref >60)
Glucose, Bld: 112 mg/dL — ABNORMAL HIGH (ref 70–99)
POTASSIUM: 3.7 mmol/L (ref 3.5–5.1)
Sodium: 138 mmol/L (ref 135–145)

## 2018-11-09 MED ORDER — HYDROCODONE-ACETAMINOPHEN 5-325 MG PO TABS: 1.0000 | ORAL_TABLET | ORAL | 0 refills | Status: DC | PRN

## 2018-11-09 MED ORDER — LEVOTHYROXINE SODIUM 88 MCG PO TABS: 88.0000 ug | ORAL_TABLET | Freq: Every day | ORAL | 3 refills | Status: DC

## 2018-11-09 MED ORDER — CALCIUM CARBONATE ANTACID 500 MG PO CHEW: 2.0000 | CHEWABLE_TABLET | Freq: Three times a day (TID) | ORAL | 1 refills | Status: DC

## 2018-11-09 NOTE — Discharge Instructions (Signed)
[facility]  THYROID & PARATHYROID SURGERY:  POST-OP INSTRUCTIONS  Always review your discharge instruction sheet from the facility where your surgery was performed.  A prescription for pain medication may be given to you upon discharge.  Take your pain medication as prescribed.  If narcotic pain medicine is not needed, then you may take acetaminophen (Tylenol) or ibuprofen (Advil) as needed.  Take your usually prescribed medications unless otherwise directed.  If you need a refill on your pain medication, please contact our office during regular business hours.  Prescriptions cannot be processed by our office after 5 pm or on weekends.  Start with a light diet upon arrival home, such as soup and crackers or toast.  Be sure to drink plenty of fluids daily.  Resume your normal diet the day after surgery.  Most patients will experience some swelling and bruising on the chest and neck area.  Ice packs will help.  Swelling and bruising can take several days to resolve.   It is common to experience some constipation after surgery.  Increasing fluid intake and taking a stool softener (Colace) will usually help or prevent this problem.  A mild laxative (Milk of Magnesia or Miralax) should be taken according to package directions if there has been no bowel movement after 48 hours.  You have steri-strips and a gauze dressing over your incision.  You may remove the gauze bandage on the second day after surgery, and you may shower at that time.  Leave your steri-strips (small skin tapes) in place directly over the incision.  These strips should remain on the skin for 5-7 days and then be removed.  You may get them wet in the shower and pat them dry.  You may resume regular (light) daily activities beginning the next day (such as daily self-care, walking, climbing stairs) gradually increasing activities as tolerated.  You may have sexual intercourse when it is comfortable.  Refrain from  any heavy lifting or straining until approved by your doctor.  You may drive when you no longer are taking prescription pain medication, you can comfortably wear a seatbelt, and you can safely maneuver your car and apply brakes.  You should see your doctor in the office for a follow-up appointment approximately three weeks after your surgery.  Make sure that you call for this appointment within a day or two after you arrive home to insure a convenient appointment time.  WHEN TO CALL YOUR DOCTOR: -- Fever greater than 101.5 -- Inability to urinate -- Nausea and/or vomiting - persistent -- Extreme swelling or bruising -- Continued bleeding from incision -- Increased pain, redness, or drainage from the incision -- Difficulty swallowing or breathing -- Muscle cramping or spasms -- Numbness or tingling in hands or around lips  The clinic staff is available to answer your questions during regular business hours.  Please don't hesitate to call and ask to speak to one of the nurses if you have concerns.  Yoceline Bazar, MD [facility] Office: [phone] 

## 2018-11-09 NOTE — Progress Notes (Signed)
Patient ambulating, vitals stable, pain controlled. Ice to neck overnight.

## 2018-11-09 NOTE — Discharge Summary (Signed)
Physician Discharge Summary [facility]  Patient ID: Taylor Carson MRN: [id number] DOB/AGE: 03/14/1991 27 y.o.  Admit date: 11/08/2018 Discharge date: 11/09/2018  Admission Diagnoses:  Multinodular thyroid goiter  Discharge Diagnoses:  Principal Problem:   Multiple thyroid nodules Active Problems:   Enlarged thyroid gland   Multinodular goiter (nontoxic)   Discharged Condition: good  Hospital Course: Patient was admitted for observation following thyroid surgery.  Post op course was uncomplicated.  Pain was well controlled.  Tolerated diet.  Post op calcium level on morning following surgery was 9.1 mg/dl.  Patient was prepared for discharge home on POD#1.  Consults: None  Treatments: surgery: total thyroidectomy  Discharge Exam: Blood pressure 120/74, pulse 88, temperature 98.7 F (37.1 C), temperature source Oral, resp. rate 16, height 5\' 4"  (1.626 m), weight 91.3 kg, last menstrual period 11/01/2018, SpO2 100 %, currently breastfeeding. HEENT - clear Neck - wound dry and intact with Dermabond in place; minimal STS; voice near normal Chest - clear bilaterally Cor - RRR   Disposition: Home  Discharge Instructions    Diet - low sodium heart healthy   Complete by:  As directed    Discharge instructions   Complete by:  As directed    [facility]  THYROID & PARATHYROID SURGERY:  POST-OP INSTRUCTIONS  Always review your discharge instruction sheet from the facility where your surgery was performed.  A prescription for pain medication may be given to you upon discharge.  Take your pain medication as prescribed.  If narcotic pain medicine is not needed, then you may take acetaminophen (Tylenol) or ibuprofen (Advil) as needed.  Take your usually prescribed medications unless otherwise directed.  If you need a refill on your pain medication, please contact our office during regular business hours.  Prescriptions cannot be  processed by our office after 5 pm or on weekends.  Start with a light diet upon arrival home, such as soup and crackers or toast.  Be sure to drink plenty of fluids daily.  Resume your normal diet the day after surgery.  Most patients will experience some swelling and bruising on the chest and neck area.  Ice packs will help.  Swelling and bruising can take several days to resolve.   It is common to experience some constipation after surgery.  Increasing fluid intake and taking a stool softener (Colace) will usually help or prevent this problem.  A mild laxative (Milk of Magnesia or Miralax) should be taken according to package directions if there has been no bowel movement after 48 hours.  You have steri-strips and a gauze dressing over your incision.  You may remove the gauze bandage on the second day after surgery, and you may shower at that time.  Leave your steri-strips (small skin tapes) in place directly over the incision.  These strips should remain on the skin for 5-7 days and then be removed.  You may get them wet in the shower and pat them dry.  You may resume regular (light) daily activities beginning the next day (such as daily self-care, walking, climbing stairs) gradually increasing activities as tolerated.  You may have sexual intercourse when it is comfortable.  Refrain from any heavy lifting or straining until approved by your doctor.  You may drive when you no longer are taking prescription pain medication, you can comfortably wear a seatbelt, and you can safely maneuver your car and apply brakes.  You should see your doctor in the office  for a follow-up appointment approximately three weeks after your surgery.  Make sure that you call for this appointment within a day or two after you arrive home to insure a convenient appointment time.  WHEN TO CALL YOUR DOCTOR: -- Fever greater than 101.5 -- Inability to urinate -- Nausea and/or vomiting - persistent -- Extreme swelling or  bruising -- Continued bleeding from incision -- Increased pain, redness, or drainage from the incision -- Difficulty swallowing or breathing -- Muscle cramping or spasms -- Numbness or tingling in hands or around lips  The clinic staff is available to answer your questions during regular business hours.  Please don't hesitate to call and ask to speak to one of the nurses if you have concerns.  [name], MD [facility] Office: [phone]   Increase activity slowly   Complete by:  As directed    No dressing needed   Complete by:  As directed      Allergies as of 11/09/2018   No Known Allergies     Medication List    TAKE these medications   acetaminophen 500 MG tablet Commonly known as:  TYLENOL Take 500 mg by mouth every 6 (six) hours as needed.   calcium carbonate 500 MG chewable tablet Commonly known as:  TUMS Chew 2 tablets (400 mg of elemental calcium total) by mouth 3 (three) times daily.   HYDROcodone-acetaminophen 5-325 MG tablet Commonly known as:  NORCO/VICODIN Take 1-2 tablets by mouth every 4 (four) hours as needed for moderate pain.   ibuprofen 200 MG tablet Commonly known as:  ADVIL,MOTRIN Take 800 mg by mouth every 6 (six) hours as needed.   levothyroxine 88 MCG tablet Commonly known as:  SYNTHROID Take 1 tablet (88 mcg total) by mouth daily before breakfast.   medroxyPROGESTERone 150 MG/ML injection Commonly known as:  DEPO-PROVERA Inject 150 mg into the muscle every 3 (three) months.   prenatal multivitamin Tabs tablet Take 1 tablet by mouth daily at 12 noon.        Velora Heckler, MD, [facility] Office: [phone]   Signed: [name] 11/09/2018, 7:30 AM

## 2018-11-10 NOTE — Progress Notes (Signed)
Please contact patient and notify of benign pathology results.  Delcie Ruppert M. Lasaro Primm, MD, FACS [facility] Office: [phone]   

## 2018-11-23 DIAGNOSIS — E890 Postprocedural hypothyroidism: Secondary | ICD-10-CM | POA: Diagnosis not present

## 2018-11-23 DIAGNOSIS — E042 Nontoxic multinodular goiter: Secondary | ICD-10-CM | POA: Diagnosis not present

## 2018-11-23 DIAGNOSIS — E049 Nontoxic goiter, unspecified: Secondary | ICD-10-CM | POA: Diagnosis not present

## 2019-02-16 IMAGING — DX DG CHEST 2V
2 series · 2 of 2 positions shown · non-contrast
Comparison: 05/26/2017

CLINICAL DATA: Substernal goiter.  Pre-op respiratory exam

EXAM:
CHEST - 2 VIEW

[chest pa]
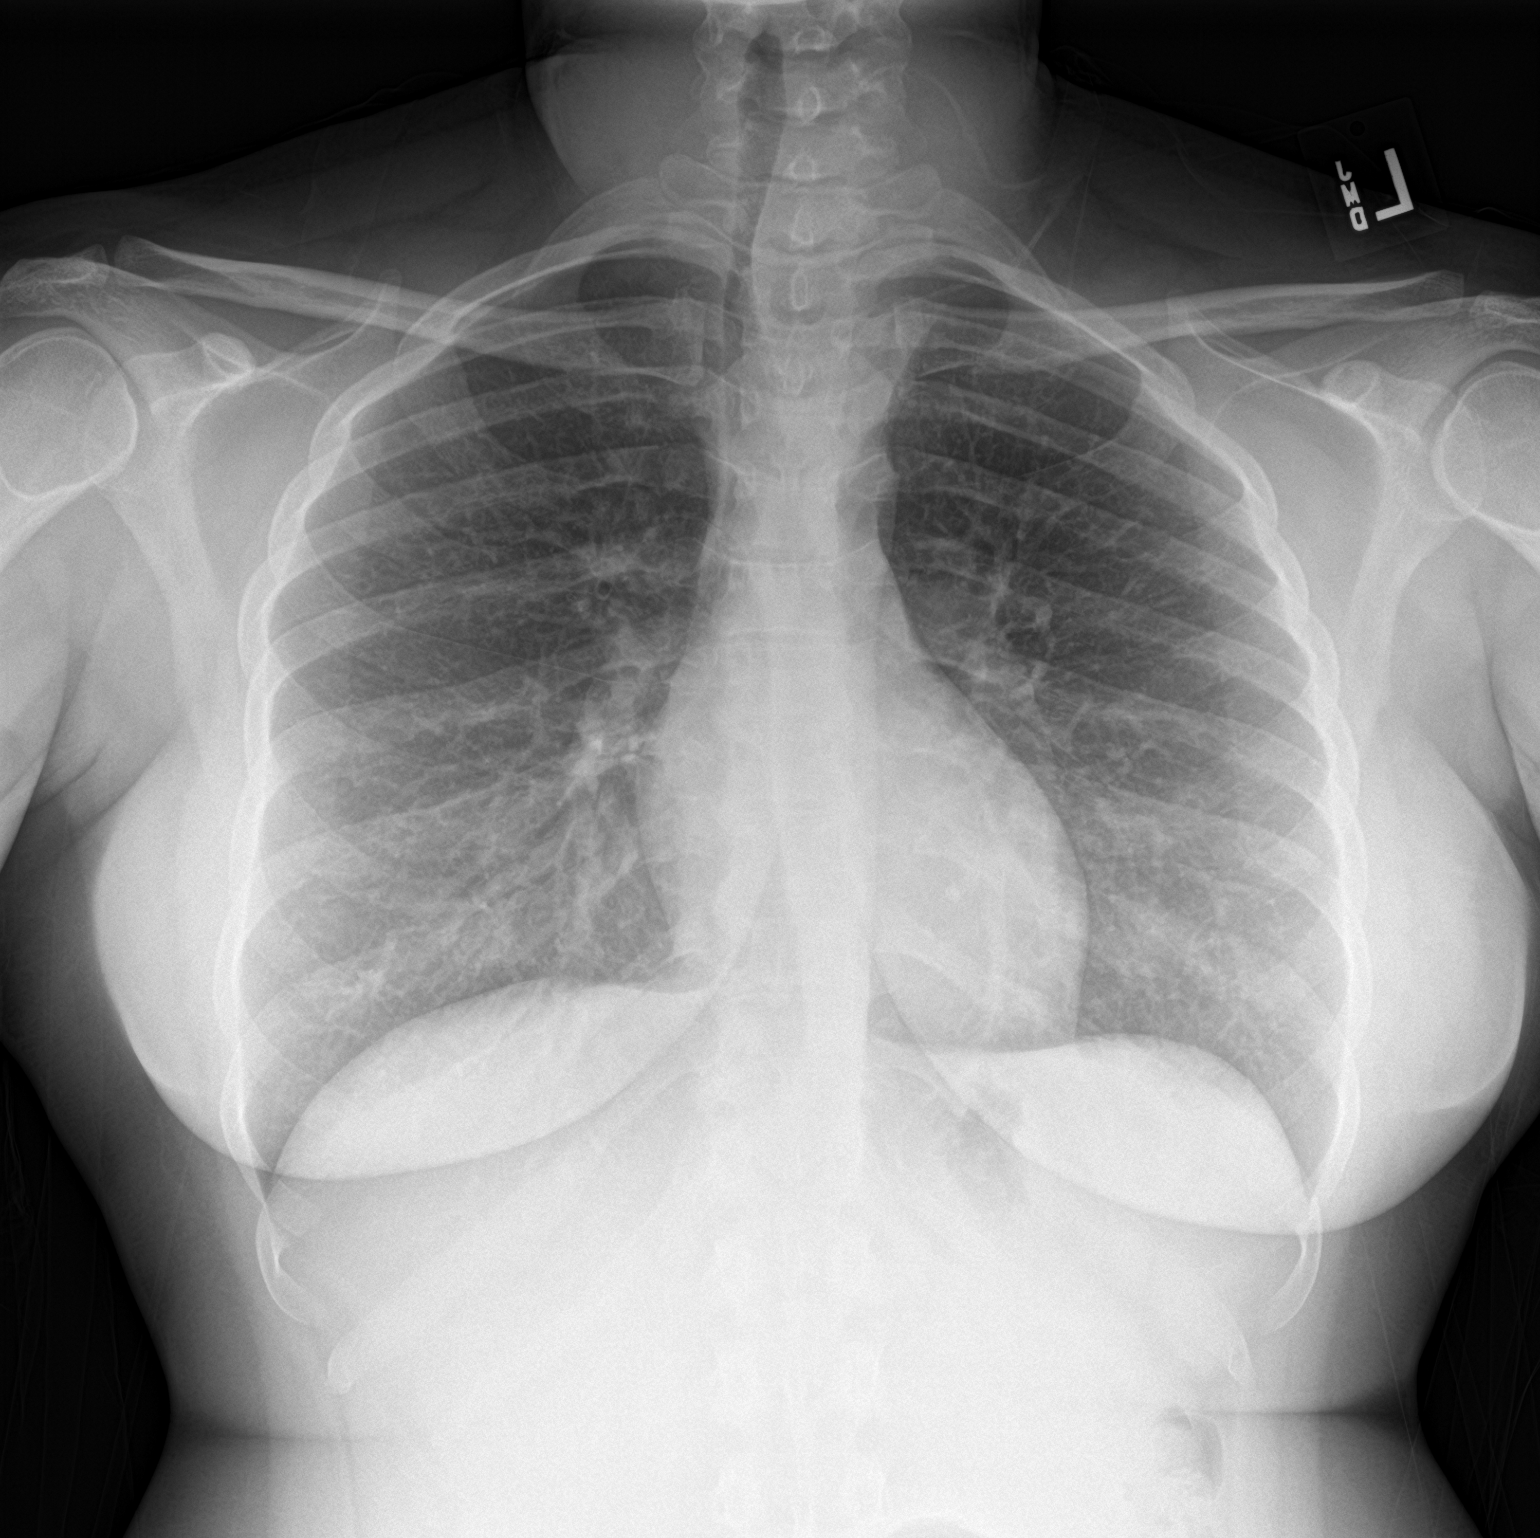

[chest lat]
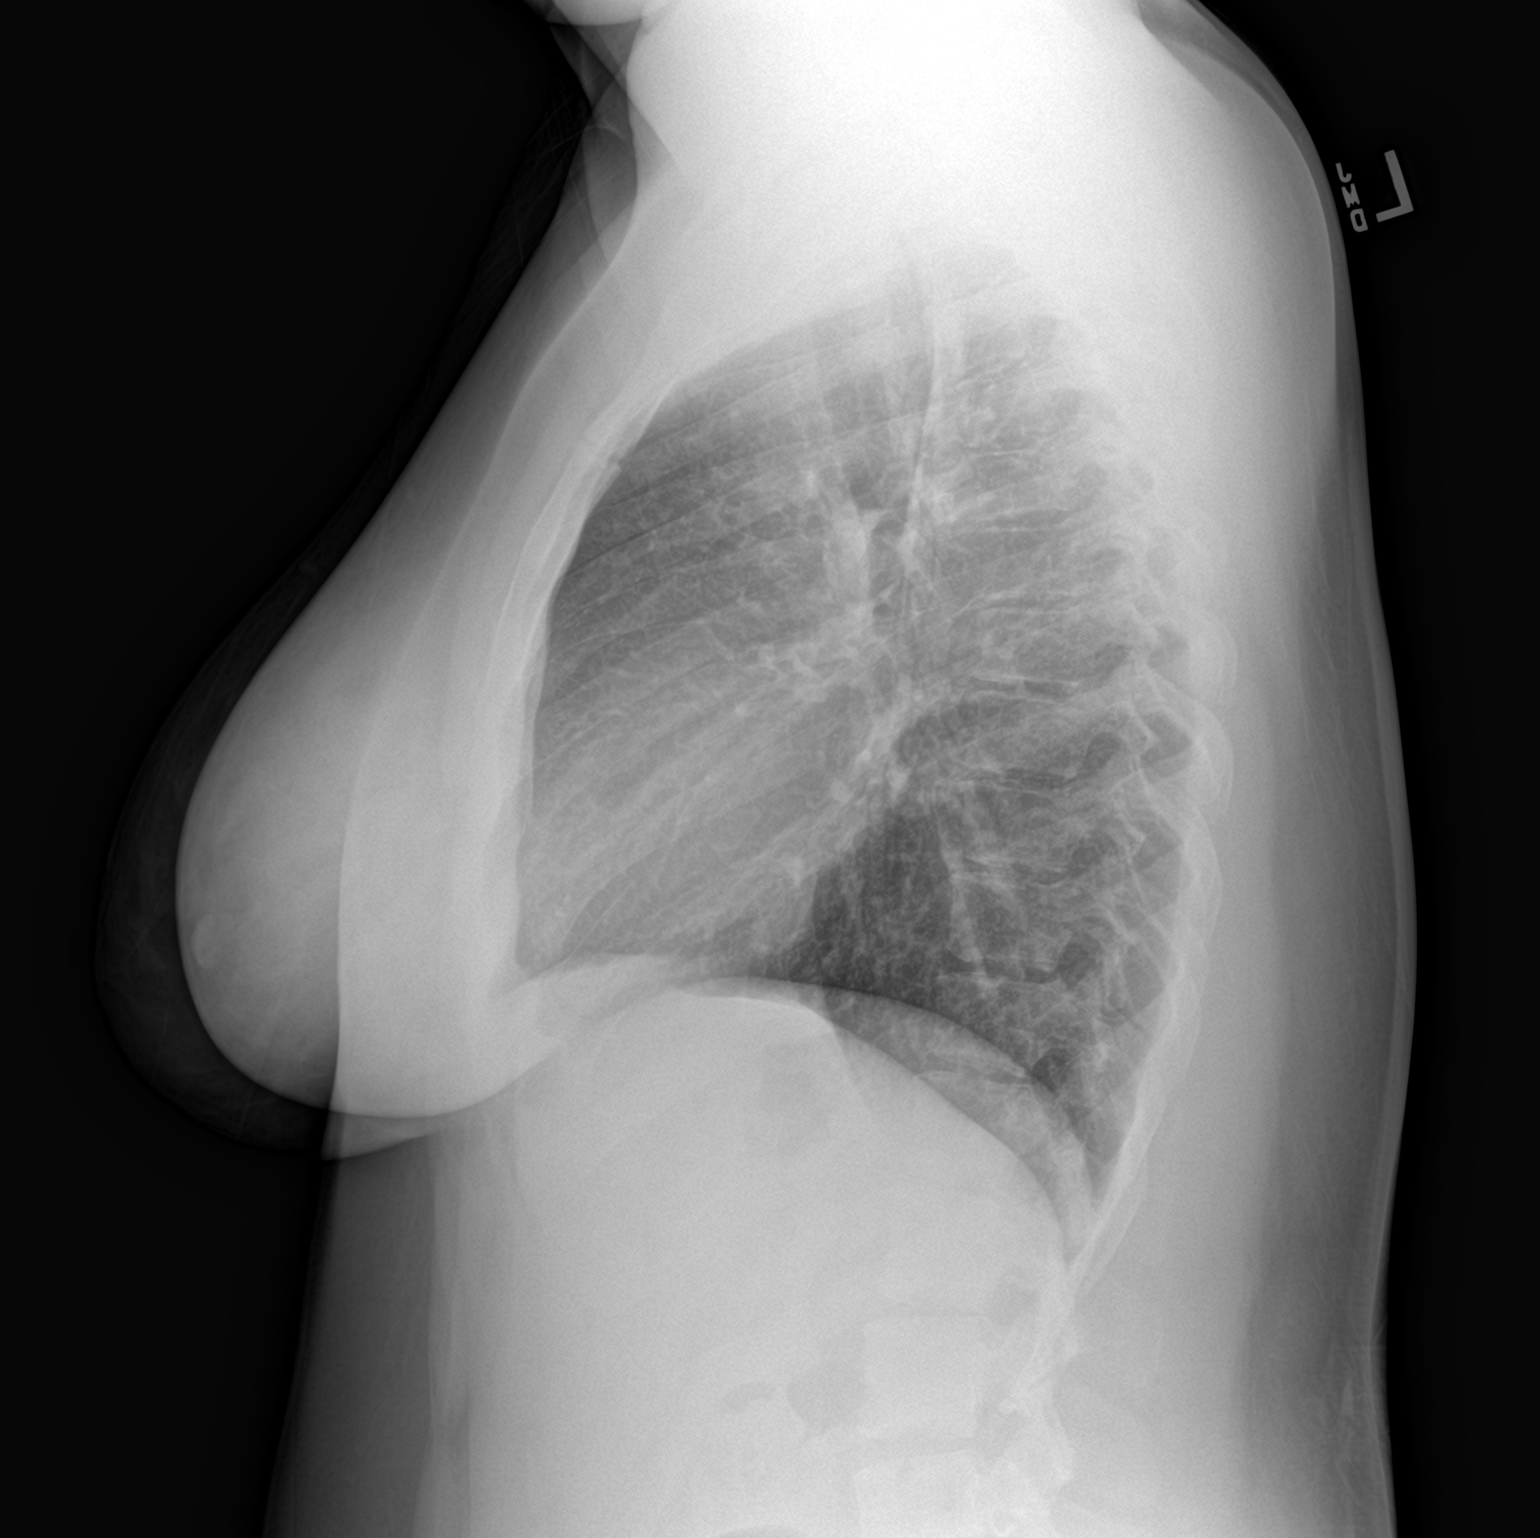

[2 of 2 positions shown; findings below may reference images not displayed]

FINDINGS: Heart size is normal. Left superior mediastinal mass is again seen
causing tracheal deviation to the right, without significant change.

Both lungs are clear. No evidence of pleural effusion.
IMPRESSION: Left superior mediastinal mass without significant change, and
consistent with known substernal goiter.

No active lung disease.

## 2019-02-20 ENCOUNTER — Other Ambulatory Visit: Payer: Self-pay

## 2019-02-20 ENCOUNTER — Telehealth (INDEPENDENT_AMBULATORY_CARE_PROVIDER_SITE_OTHER): Payer: Medicaid Other | Admitting: Family Medicine

## 2019-02-20 DIAGNOSIS — J069 Acute upper respiratory infection, unspecified: Secondary | ICD-10-CM | POA: Insufficient documentation

## 2019-02-20 MED ORDER — GUAIFENESIN-CODEINE 100-10 MG/5ML PO SOLN: 10.0000 mL | Freq: Three times a day (TID) | ORAL | 0 refills | Status: DC | PRN

## 2019-02-20 NOTE — Progress Notes (Signed)
[facility] Telemedicine Visit  Patient consented to have virtual visit. Method of visit: Telephone  Encounter participants: Patient: Taylor Carson - located at home Provider: Myrene Buddy - located at [facility] Others (if applicable): N/A  Chief Complaint: Cough, Fever  HPI: 28 year old female who called for 1 week history of cough.  States initially she had a dry cough, slightly sore throat, and a lot of sneezing.  She does not think that these were related to allergies, she typically has red watery eyes and runny nose but that.  She states that her symptoms slowly got better over the next day or 2, resolved for 2 days, and then resumed after that time.  She states that her symptoms resumed she had high-grade fever up to 106, mucus production from her coughing, and did not feel well.  The resumption of the symptoms coincided with her running out of her levothyroxine getting that refilled for roughly 3 days.  She did get it refilled and is feeling much better.  Her fevers have resolved, her main complaint now is the cough.  Says that she has had a few streaks in her sputum.  She has taken several over-the-counter medications including herbal remedies, NyQuil, DayQuil, Sudafed.  Did not seem to help her.  She is unaware of any sick contacts, but does state that her live-in boyfriend is a Music therapist that he may have been in contact.  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  She does not believe she has had any close contacts with anybody who has been COVID-19 positive.   ROS: per HPI  Pertinent PMHx: Multinodular goiter, status post thyroidectomy in January  Exam: General: No distress, able to talk on phone and for complete sentences with no difficulty Respiratory: Nonlabored, no wheezing, no trouble.  Intermittent hacking cough noted, no distress Psych: Pleasant, appropriate  Assessment/Plan:  Upper respiratory infection Symptoms consistent with viral  upper respiratory infection.  Her not taking her Synthroid for 3 to 4 days likely exacerbated and created new symptoms including fever, fatigue.  Symptoms have greatly improved since restarting her Synthroid.  Her cough persists and is her main problem at this point.  Can take Robitussin-DM.  Will send in prescription for Guaifenasin/codeine as has been going on for over 1 week and has had streaky hemoptysis likely secondary to tracheal and lung irritation.  Gave return precautions, patient can follow-up either with phone visit or in person in 1 week if not better.    Time spent during visit with patient: 15 minutes

## 2019-02-20 NOTE — Assessment & Plan Note (Signed)
Symptoms consistent with viral upper respiratory infection.  Her not taking her Synthroid for 3 to 4 days likely exacerbated and created new symptoms including fever, fatigue.  Symptoms have greatly improved since restarting her Synthroid.  Her cough persists and is her main problem at this point.  Can take Robitussin-DM.  Will send in prescription for Guaifenasin/codeine as has been going on for over 1 week and has had streaky hemoptysis likely secondary to tracheal and lung irritation.  Gave return precautions, patient can follow-up either with phone visit or in person in 1 week if not better.

## 2019-02-23 ENCOUNTER — Encounter: Payer: Self-pay | Admitting: *Deleted

## 2019-03-30 ENCOUNTER — Inpatient Hospital Stay (HOSPITAL_COMMUNITY)
Admission: AD | Admit: 2019-03-30 | Discharge: 2019-03-30 | Disposition: A | Discharge status: Home / Self Care | Payer: Medicaid Other | Attending: Obstetrics and Gynecology | Admitting: Obstetrics and Gynecology

## 2019-03-30 ENCOUNTER — Other Ambulatory Visit: Payer: Self-pay

## 2019-03-30 DIAGNOSIS — Z3202 Encounter for pregnancy test, result negative: Secondary | ICD-10-CM | POA: Diagnosis not present

## 2019-03-30 LAB — POCT PREGNANCY, URINE: Preg Test, Ur: NEGATIVE

## 2019-03-30 NOTE — MAU Note (Signed)
Pt advised by N.Nugent,NP on where to go for her symptoms since she was not pregnant.

## 2019-03-30 NOTE — MAU Provider Note (Signed)
S [name] is a 28 y.o. G28P1011 non-pregnant female who presents to MAU today with complaint of pelvic pain.   O BP 121/78   Pulse 85   Temp 98.4 F (36.9 C)   Resp 18   Ht 5\' 4"  (1.626 m)   Wt 96.6 kg   LMP 03/19/2019   BMI 36.56 kg/m  Physical Exam  Constitutional: She is oriented to person, place, and time. She appears well-developed and well-nourished. No distress.  HENT:  Head: Normocephalic and atraumatic.  Respiratory: Effort normal.  Neurological: She is alert and oriented to person, place, and time.  Skin: She is not diaphoretic.  Psychiatric: She has a normal mood and affect. Her behavior is normal.   A Non pregnant female Medical screening exam complete  P Discharge from MAU in stable condition Patient given the option of transfer to [facility] for further evaluation or seek care in outpatient facility of choice List of options for follow-up given  Warning signs for worsening condition that would warrant emergency follow-up discussed Patient may return to MAU as needed for pregnancy related complaints  Creig Landin, Gerrie Nordmann, NP 03/30/2019 6:10 PM

## 2019-03-30 NOTE — MAU Note (Signed)
C/O severe pelvic pain since last night. Pain is like when she had her ectopic pregnancy. Had 2 days of bleeding on 6/7-6/9 but unusually short for a period.

## 2019-04-19 ENCOUNTER — Other Ambulatory Visit: Payer: Self-pay

## 2019-04-19 MED ORDER — LEVOTHYROXINE SODIUM 88 MCG PO TABS: 88.0000 ug | ORAL_TABLET | Freq: Every day | ORAL | 3 refills | Status: DC

## 2019-05-12 ENCOUNTER — Ambulatory Visit: Payer: Medicaid Other | Admitting: Family Medicine

## 2019-05-15 ENCOUNTER — Ambulatory Visit: Payer: Medicaid Other | Admitting: Family Medicine

## 2019-07-18 ENCOUNTER — Other Ambulatory Visit: Payer: Self-pay

## 2019-07-18 ENCOUNTER — Telehealth (INDEPENDENT_AMBULATORY_CARE_PROVIDER_SITE_OTHER): Payer: Medicaid Other | Admitting: Family Medicine

## 2019-07-18 DIAGNOSIS — E890 Postprocedural hypothyroidism: Secondary | ICD-10-CM | POA: Diagnosis not present

## 2019-07-18 DIAGNOSIS — E039 Hypothyroidism, unspecified: Secondary | ICD-10-CM | POA: Insufficient documentation

## 2019-07-18 NOTE — Assessment & Plan Note (Signed)
Due to thyroidectomy.  Will check TSH in 3 weeks once is on levothryorine regularly

## 2019-07-18 NOTE — Progress Notes (Signed)
Subjective  Taylor Carson is a 28 y.o. female is presenting with the following   Chief Complaint noted Review of Symptoms - see HPI PMH - Smoking status noted.    Objective Vital Signs reviewed There were no vitals taken for this visit.  Assessments/Plans  Hypothyroid Due to thyroidectomy.  Will check TSH in 3 weeks once is on levothryorine regularly   STOMACH PAIN Sounds consistent with diaphragm muscle cramp.  No symptoms of GI disease or pulmonary problems - Will observe  See after visit summary for details of patient instructions

## 2019-07-18 NOTE — Progress Notes (Signed)
[facility] Telemedicine Visit  Patient consented to have virtual visit. Method of visit: Telephone  Encounter participants: Patient: Taylor Carson - located at home Provider: Lind Covert - located at work Others (if applicable): no  Chief Complaint: Thyroid  HPI:  HYPOTHYROIDISM Had total thyroidectomy early 2020.  Started on thyroid replacement but never follow up for TSH.  Has been taking 88 mcg except missed 3 days about a week ago Disease Monitoring Weight changes: no  Skin Changes: no Palpitations: no Heat/Cold intolerance: no Duration - years  Timing - continuous  Severity - controlled  Medication Monitoring   Last TSH:   Lab Results  Component Value Date   TSH 1.394 06/25/2018    STOMACH PAIN Brief seconds of abdomen cramp like sensation when coughs a lot or laughs No change in bowel movement or bleeding  ROS: per HPI   Exam:  Respiratory: sounds nasally congested no shortness of breath   Assessment/Plan:    Hypothyroid Due to thyroidectomy.  Will check TSH in 3 weeks once is on levothryorine regularly     Time spent during visit with patient: 12 minutes

## 2019-09-11 ENCOUNTER — Other Ambulatory Visit: Payer: Self-pay | Admitting: Family Medicine

## 2019-09-14 ENCOUNTER — Telehealth (INDEPENDENT_AMBULATORY_CARE_PROVIDER_SITE_OTHER): Payer: Medicaid Other | Admitting: Family Medicine

## 2019-09-14 ENCOUNTER — Other Ambulatory Visit: Payer: Self-pay

## 2019-09-14 DIAGNOSIS — E890 Postprocedural hypothyroidism: Secondary | ICD-10-CM | POA: Diagnosis not present

## 2019-09-14 DIAGNOSIS — M5442 Lumbago with sciatica, left side: Secondary | ICD-10-CM | POA: Insufficient documentation

## 2019-09-14 NOTE — Assessment & Plan Note (Signed)
Advised patient to take her levothyroxine 1 hour before breakfast and we will recheck her TSH after about 6 weeks.

## 2019-09-14 NOTE — Progress Notes (Signed)
[place] Telemedicine Visit  Patient consented to have virtual visit. Method of visit: Video  Encounter participants: Patient: Taylor Carson - located at home Provider: Kathrene Alu - located at [facility] Others (if applicable): none  Chief Complaint: lower back pain, hypothyroidism  HPI: Lower back pain Has had pain in her buttocks and lower back, which started in April.  The pain initially was located in her left buttock area and radiated down to her left foot but now is more in the lower back area.  She has tried yoga and stretching exercises, which does seem to help somewhat.  Twisting movements seem to exacerbate the pain.  She denies numbness, tingling, or incontinence.  She has tried ibuprofen and Tylenol without much relief.  She cannot identify any inciting event.  Hypothyroidism She has been out of her Synthroid for about a week, but a new prescription was called in on 11/30 and she has been taking it since then.  She is worried that she may be experiencing symptoms of hypothyroidism since she did not have her medicine for a week.  She says that she was very fatigued at the end of the day yesterday.  ROS: per HPI  Pertinent PMHx: Hypothyroidism  Exam:  Respiratory: Able to speak in complete sentences without shortness of breath, no cough during exam  Assessment/Plan:  Hypothyroid Advised patient to take her levothyroxine 1 hour before breakfast and we will recheck her TSH after about 6 weeks.  Acute left-sided low back pain with left-sided sciatica Advised patient to continue practicing yoga and other stretches and exercises for her back.  Also encouraged her to continue using a heat pad and Tylenol for pain relief.  Reassured her that this pain sounds muscular in nature and will likely resolve with time.  However, she is welcome to be seen in person for a more detailed exam if she has further questions or concerns.    Time spent during visit  with patient: 8 minutes

## 2019-09-14 NOTE — Assessment & Plan Note (Signed)
Advised patient to continue practicing yoga and other stretches and exercises for her back.  Also encouraged her to continue using a heat pad and Tylenol for pain relief.  Reassured her that this pain sounds muscular in nature and will likely resolve with time.  However, she is welcome to be seen in person for a more detailed exam if she has further questions or concerns.

## 2019-10-16 ENCOUNTER — Telehealth: Payer: Self-pay

## 2019-10-16 NOTE — Telephone Encounter (Signed)
Patient LVM on nurse line stating, "I think I am having an ectopic pregnancy." I tried calling her back x2, no answer or able to leave mail due to her mailbox being full.

## 2019-11-01 ENCOUNTER — Ambulatory Visit: Payer: Medicaid Other | Admitting: Family Medicine

## 2019-12-18 ENCOUNTER — Other Ambulatory Visit: Payer: Self-pay | Admitting: Family Medicine

## 2019-12-18 MED ORDER — LEVOTHYROXINE SODIUM 88 MCG PO TABS: 88.0000 ug | ORAL_TABLET | Freq: Every day | ORAL | 0 refills | Status: DC

## 2020-01-09 ENCOUNTER — Encounter: Payer: Self-pay | Admitting: Family Medicine

## 2020-01-09 ENCOUNTER — Other Ambulatory Visit: Payer: Self-pay

## 2020-01-09 ENCOUNTER — Ambulatory Visit: Payer: Medicaid Other | Admitting: Family Medicine

## 2020-01-09 VITALS — BP 100/78 | HR 65 | Ht 64.0 in | Wt 229.0 lb

## 2020-01-09 DIAGNOSIS — E890 Postprocedural hypothyroidism: Secondary | ICD-10-CM | POA: Diagnosis not present

## 2020-01-09 DIAGNOSIS — F3289 Other specified depressive episodes: Secondary | ICD-10-CM | POA: Diagnosis not present

## 2020-01-09 DIAGNOSIS — N926 Irregular menstruation, unspecified: Secondary | ICD-10-CM

## 2020-01-09 LAB — POCT URINE PREGNANCY: Preg Test, Ur: NEGATIVE

## 2020-01-09 NOTE — Progress Notes (Signed)
    SUBJECTIVE:   CHIEF COMPLAINT / HPI:   MOOD Reports feeling anxious and hearing voices for the last few weeks.  Also feels gets angry easier.  Has not been violent physically.  Hears conversations that she can't really understand.  Voices do not talk to her.  Has seen a figure once or twice.   Never had hallucinations like this before.  Does smoke about 6 blunts a day has increased to this since hearing voices.  No alcohol or other drugs.  Only medication is thyroid and gummy melatonin.   No suicidal ideation or HI  PERTINENT  PMH / PSH: Was hospitalized briefly a few years ago after OD of sleeping pills and was treated for depression with medication during her pregnancy. Apparently mental illness is common in her family.    HYPOTHYROID Taking levothyroxine daily.    OBJECTIVE:   BP 100/78   Pulse 65   Ht 5\' 4"  (1.626 m)   Wt 229 lb (103.9 kg)   SpO2 98%   BMI 39.31 kg/m   Psych:  Cognition and judgment appear intact. Alert, communicative  and cooperative with normal attention span and concentration. No apparent delusions, illusions, hallucinations   ASSESSMENT/PLAN:   Mood disorder (HCC) With auditory hallucinations.  No suicidal ideation.  Asked her to urgently set up visit with psychiatry. (see after visit summary) Also to rapidly wean off of marijuana.  Monitor closely    Hypothyroid Will check labs.  Continue current medications    I spent 40 minutes reviewing her chart, discussing with our psychologist and in face to face patient time    , MD [facility]

## 2020-01-09 NOTE — Assessment & Plan Note (Signed)
Will check labs.  Continue current medications

## 2020-01-09 NOTE — Assessment & Plan Note (Signed)
With auditory hallucinations.  No suicidal ideation.  Asked her to urgently set up visit with psychiatry. (see after visit summary) Also to rapidly wean off of marijuana.  Monitor closely

## 2020-01-09 NOTE — Patient Instructions (Addendum)
For your Mental Health - we will check blood tests - if they are abnormal I will call you  -  You should make an appointment with one of the organizations below ASAP  - Stop using all substances   - If you are feeling suicidal or homicidal call the emergency line below  - Make an appointment to see me in 2 weeks.     - Call if you are having any problems getting help - [phone] and ask me to contact you.   Psychiatry Resource List (Adults and Children) . When calling to make an appointment have your insurance information available to confirm you are covered.  [place]:   [place]: [address]     [phone]   [place]: [place],        [phone] : [place],    [place]: [place],                   [phone] [place]: [place] [address]         [phone]  [place]  (Psychiatry & counseling ; adults & children ; will take Medicaid) [place], [place], [place]        [phone]   [place]  (Psychiatry only; Adults only, will take Medicaid)  [place], [place], [place] [address]       [phone]   [place] (Psychiatry & counseling ; adults & children ; will take Medicaid [address]  [address]  [place] [address]   Go on-line to complete referral ( [email or web address] [phone]    (Spanish therapist)  [facility]  Psychiatry & counseling; Adults and children;  Call Registration prior to scheduling an appointment [phone] [address]    [place], [place] [address]    [phone]  [facility] (Psychiatry & counseling; adults & children; Medicare no Medicaid)  [place], [place]  [address]      [phone]     [facility] (up to age 34)  Psychiatry & counseling ,will take Medicaid, must do counseling to receive psychiatry services  [address] [place] [address]        [phone]   [facility]---  Walk-in Mon-Fri, 8:30-5:00 (will take Medicaid)  [address] [place], [place]  [phone]  To schedule an appointment call [phone]  [facility] --- Walk-In Mon-Friday 8am-3pm ( will take Medicaid, Psychiatry, Adults & children,  [address], [place], [place]   [phone]  [facility] [place]--, Walk-in M-F 8am-12pm and 1pm -3pm   (Counseling, Psychiatry, will take Medicaid, adults & children)  [address], [place], [place]  [phone]    If you are feeling suicidal or depression symptoms worsen please immediately go to:   24 Hour Availability [facility]  [address] [place], [place] [address]  [phone] or [phone]  . If you are thinking about harming yourself or having thoughts of suicide, or if you know someone who is, seek help right away. . Call your doctor or mental health care provider. . Call 911 or go to a hospital emergency room to get immediate help, or ask a friend or family member to help you do these  things. . Call the [place] National Suicide Prevention Lifeline's toll-free, 24-hour hotline at 1-800-273-TALK 914-679-4023) or TTY: 1-800-799-4 TTY (539)341-8382) to talk to a trained counselor. . If you are in crisis, make sure you are not left alone.

## 2020-01-10 LAB — COMPREHENSIVE METABOLIC PANEL
ALT: 13 IU/L (ref 0–32)
AST: 17 IU/L (ref 0–40)
Albumin/Globulin Ratio: 1.5 (ref 1.2–2.2)
Albumin: 4.4 g/dL (ref 3.9–5.0)
Alkaline Phosphatase: 61 IU/L (ref 39–117)
BUN/Creatinine Ratio: 9 (ref 9–23)
BUN: 9 mg/dL (ref 6–20)
Bilirubin Total: <0.2 mg/dL (ref 0.0–1.2)
CO2: 20 mmol/L (ref 20–29)
Calcium: 9.4 mg/dL (ref 8.7–10.2)
Chloride: 106 mmol/L (ref 96–106)
Creatinine, Ser: 0.97 mg/dL (ref 0.57–1.00)
GFR calc Af Amer: 93 mL/min/{1.73_m2} (ref >59)
GFR calc non Af Amer: 81 mL/min/{1.73_m2} (ref >59)
Globulin, Total: 3 g/dL (ref 1.5–4.5)
Glucose: 94 mg/dL (ref 65–99)
Potassium: 4.6 mmol/L (ref 3.5–5.2)
Sodium: 139 mmol/L (ref 134–144)
Total Protein: 7.4 g/dL (ref 6.0–8.5)

## 2020-01-10 LAB — TSH: TSH: 16.2 u[IU]/mL — ABNORMAL HIGH (ref 0.450–4.500)

## 2020-01-10 LAB — T4, FREE: Free T4: 0.8 ng/dL — ABNORMAL LOW (ref 0.82–1.77)

## 2020-01-11 ENCOUNTER — Telehealth: Payer: Self-pay | Admitting: Family Medicine

## 2020-01-11 MED ORDER — LEVOTHYROXINE SODIUM 100 MCG PO TABS: 100.0000 ug | ORAL_TABLET | Freq: Every day | ORAL | 1 refills | Status: DC

## 2020-01-11 NOTE — Telephone Encounter (Signed)
Spoke with her  She is doing ok Has called for appointment with psychiatry and waiting a call back  Recommend she take a higher 100 mcg levothyroxine dose  Also to call if feeling worse  She agrees

## 2020-04-30 ENCOUNTER — Encounter: Payer: Self-pay | Admitting: Family Medicine

## 2020-04-30 ENCOUNTER — Ambulatory Visit (INDEPENDENT_AMBULATORY_CARE_PROVIDER_SITE_OTHER): Payer: Medicaid Other | Admitting: Family Medicine

## 2020-04-30 ENCOUNTER — Other Ambulatory Visit: Payer: Self-pay

## 2020-04-30 DIAGNOSIS — F39 Unspecified mood [affective] disorder: Secondary | ICD-10-CM | POA: Diagnosis not present

## 2020-04-30 DIAGNOSIS — E890 Postprocedural hypothyroidism: Secondary | ICD-10-CM

## 2020-04-30 DIAGNOSIS — R519 Headache, unspecified: Secondary | ICD-10-CM | POA: Diagnosis not present

## 2020-04-30 NOTE — Progress Notes (Addendum)
    SUBJECTIVE:   CHIEF COMPLAINT / HPI:   Facial , eye, neck pain ; and headache Patient reports feeling of left sided throat swelling starting last night (~10pm); with additional feeling of left sided facial, eye, and neck pain; and congruent unilateral frontal headache starting this morning. Feels facial and neck pain better described as discomfort. Eye pain minimal at rest, but worsens with movement of the eyeball and with rubbing. Additionally, patient with eye watering. Denies changes in vision, or ability to move limbs. Denies recent trauma and sick contacts. Denies grinding of teeth.    Mood Patient reports mood as stable and impaired; with notable hallucinations (seeing things out of the corner of her eyes). Periodically with dimished interest in daily activities. Enjoys baking and playing with son. Describes infrequent episodes of passive suicidal ideation with no plan; no homicidal ideation. Frequently journals. Concentration has been poor. Reports insomnia: she gets about six hours of disrupted sleep per night (wakes up in between, unsure why). Patient endorses sustained marijuana use with no change in frequency, and has been pivoting between an edible formulation and smoking. Patient lives with sister. Father of child noted as supportive, though not currently in a relationship. Patient has called psychiatrist, and is waiting on a response.    PERTINENT  PMH / PSH: hypothyroidism--on levothyroxine  OBJECTIVE:  General: No acute distress. Pleasant and cooperative. Intermittently tearful.  HEENT: Asymmetric feeling of pressure to left forehead touch, pain on palpation of TMJ and upon opening jaw. Mild pain with palpation under eye, and with movement of left eye. L eye - PERRL, mild photophobia with light in left eye but not when shined in R eye.  Before became tearful was mild amount of conjunctival injection in L eye neither definitely peripheral or peri iris.  No discharge  visible TMs L tm with wax R tm normal No lesions around ears or facial skin  Neuro: Normal finger to nose, appropriate gait, CN II-XII  Intact (note: EOM intact)  Psych: Mood-congruent affect. Well groomed. Speech of normal rate and rythymn. Does not appear to be responding to internal stimuli   BP 122/84   Pulse 66   Wt 219 lb 8 oz (99.6 kg)   SpO2 97%   BMI 37.68 kg/m     ASSESSMENT/PLAN:    Face pain Unclear etiology.  Does not seem to be acute sinusitis or iritis or shingles or cva.  Most consistent with viral conjunctivitis with sinus congestion.   Given uncertainty will have her reevaluated in 48 hours   cool wash cloth on eye and face -visine in eye if irritated -set eye exam appointment -Seek care if vision changes, sores on face or ear, or high fevers     Mood disorder (HCC) Not clearly depression or psychosis.  Symptoms seem to wax and wane and has not followed up consistently. Significant MJ use likely clouding the picture No SI or HI  Would want psychiatric evaluation for diagnosis. Suggested she walk in to [facility] [place] today or tomorrow    I was present during history physical and decision making  Carney Living, MD [facility]

## 2020-04-30 NOTE — Patient Instructions (Addendum)
Good to see you today - Thanks for coming in  For the Left Side Pain  Use cool wash cloth on eye and face  Use Visine in your eye if irritated  If any change in vision or high fever or sores on your face or ear should see you back right away  For the Mood  Call to set up a psychiatry appointment  If you are feeling worse then call us  Make an appointment to check your eye on Access to Care in 2 days   Please always bring your medication bottles  Come back to see me in 2-4 weeks  Psychiatry Resource List (Adults and Children) Most of these providers will take Medicaid. please consult your insurance for a complete and updated list of available providers. When calling to make an appointment have your insurance information available to confirm you are covered.  [facility]:   [place]: [address]     [phone]   [place]: [address] [place],        [phone] [place]: [address],    [phone] [place]: [phone] [place],                   [phone] Children: [facility] [address]         [phone]   [facility]  (Psychiatry only; Adults /children 12 and over, will take Medicaid)  [address] Laurell Josephs [address], [place], [place] [address]       [phone]   [facility] (Psychiatry & counseling ; adults & children ; will take Medicaid [address]  [place] [address]   Go on-line to complete referral ( [email or web address] [phone]    (Spanish therapist)  [facility]  Psychiatry & counseling; Adults and children;  Call Registration prior to scheduling an appointment [phone] [address]    [place], [place] [address]    [phone]  [facility] (Psychiatry & counseling; adults & children; Medicare no Medicaid)  [address]   [place], [place]  [address]      [phone]    [facility] (up to age 22)  Psychiatry & counseling ,will take Medicaid, must do counseling to receive psychiatry services  [address] [place] [address]        [phone]  [facility] (Psychiatry & counseling; adults & children; will take Medicaid) Will need a referral from provider [address],  [place], [place]  [phone]  [facility]---  Walk-in Mon-Fri, 8:30-5:00 (will take Medicaid)  [address] [place], [place]  [phone]  To schedule an appointment call [phone]  [facility] --- Walk-In Mon-Friday 8am-3pm ( will take Medicaid, Psychiatry, Adults & children,  [address], [place], [place]   [phone]   [facility] [place]--, Walk-in M-F 8am-12pm and 1pm -3pm   (Counseling, Psychiatry, will take Medicaid, adults & children)  [address], [place], [place]  [phone]

## 2020-05-01 DIAGNOSIS — R519 Headache, unspecified: Secondary | ICD-10-CM | POA: Insufficient documentation

## 2020-05-01 LAB — CMP14+EGFR
ALT: 11 IU/L (ref 0–32)
AST: 18 IU/L (ref 0–40)
Albumin/Globulin Ratio: 1.5 (ref 1.2–2.2)
Albumin: 4.4 g/dL (ref 3.9–5.0)
Alkaline Phosphatase: 61 IU/L (ref 48–121)
BUN/Creatinine Ratio: 9 (ref 9–23)
BUN: 7 mg/dL (ref 6–20)
Bilirubin Total: 0.2 mg/dL (ref 0.0–1.2)
CO2: 21 mmol/L (ref 20–29)
Calcium: 9.2 mg/dL (ref 8.7–10.2)
Chloride: 107 mmol/L — ABNORMAL HIGH (ref 96–106)
Creatinine, Ser: 0.8 mg/dL (ref 0.57–1.00)
GFR calc Af Amer: 117 mL/min/{1.73_m2} (ref >59)
GFR calc non Af Amer: 101 mL/min/{1.73_m2} (ref >59)
Globulin, Total: 3 g/dL (ref 1.5–4.5)
Glucose: 85 mg/dL (ref 65–99)
Potassium: 4.3 mmol/L (ref 3.5–5.2)
Sodium: 140 mmol/L (ref 134–144)
Total Protein: 7.4 g/dL (ref 6.0–8.5)

## 2020-05-01 LAB — T4: T4, Total: 6 ug/dL (ref 4.5–12.0)

## 2020-05-01 LAB — TSH: TSH: 5.41 u[IU]/mL — ABNORMAL HIGH (ref 0.450–4.500)

## 2020-05-01 NOTE — Assessment & Plan Note (Signed)
Not clearly depression or psychosis.  Symptoms seem to wax and wane and has not followed up consistently. Significant MJ use likely clouding the picture No SI or HI  Would want psychiatric evaluation for diagnosis. Suggested she walk in to [facility] [place] today or tomorrow

## 2020-05-01 NOTE — Assessment & Plan Note (Signed)
Unclear etiology.  Does not seem to be acute sinusitis or iritis or shingles or cva.  Most consistent with viral conjunctivitis with sinus congestion.   Given uncertainty will have her reevaluated in 48 hours   cool wash cloth on eye and face -visine in eye if irritated -set eye exam appointment -Seek care if vision changes, sores on face or ear, or high fevers

## 2020-05-02 ENCOUNTER — Ambulatory Visit (INDEPENDENT_AMBULATORY_CARE_PROVIDER_SITE_OTHER): Payer: Medicaid Other | Admitting: Family Medicine

## 2020-05-02 ENCOUNTER — Other Ambulatory Visit: Payer: Self-pay

## 2020-05-02 VITALS — BP 110/70 | HR 71 | Ht 64.0 in | Wt 219.4 lb

## 2020-05-02 DIAGNOSIS — H5712 Ocular pain, left eye: Secondary | ICD-10-CM

## 2020-05-02 DIAGNOSIS — R519 Headache, unspecified: Secondary | ICD-10-CM

## 2020-05-02 NOTE — Patient Instructions (Signed)
I am glad that he came back in today. I am glad that many of your symptoms seem to be improving. We have put in a referral to ophthalmology today. You should get a call in the next 1-2 days about setting up an appointment with the ophthalmologist (that is a special eye doctor). If you notice that your eye pain is worsening, your headache is worsening or you start having fevers, I recommend that you be seen the same day here in our clinic or the ED if you cannot be seen in our clinic.

## 2020-05-02 NOTE — Progress Notes (Signed)
    SUBJECTIVE:   CHIEF COMPLAINT / HPI:   Eye pain Last seen by Dr. Deirdre Priest on 7/20 for eye pain and encouraged to follow-up after several days.  Today, she presents to clinic with significant improvement in her symptoms but without total resolution.  She continues to have headaches and mild sensitivity to light.  She did not appreciate her sensitivity to light until she went to bed last night and noticed that when she turned her lights off and closed her eyes she suddenly felt much more comfortable.  She had previously noted some sensation of fullness around the left side of her face/nose/maxilla which has improved.  She denies fevers, nausea, vomiting.  She has never had this particular issue before.  PERTINENT  PMH / PSH: Noncontributory previous medical history  OBJECTIVE:   BP 110/70   Pulse 71   Ht 5\' 4"  (1.626 m)   Wt 219 lb 6 oz (99.5 kg)   LMP 04/28/2020   SpO2 99%   BMI 37.66 kg/m    General: Seated comfortably in the chair in the exam room.  No acute distress. HEENT: Extraocular muscles intact.  Pain with eye movement with leftward gaze and downward gaze.  No evidence of nystagmus.  Pupils equal, round and reactive to light.  Sensitivity to light in the left eye no significant light sensitivity in the right eye.  No significant vision changes.  No notable swelling of her maxilla.  No tenderness over sinuses.  Oropharynx normal and unremarkable.  Right TM easily visualized.  Left TM not visualized due to impacted cerumen.  No pain with exam of the left ear.  Erythema, injection, trauma to the sclera. Respiratory: Breathing comfortably on room air.  No respiratory distress. Skin: Warm, dry  ASSESSMENT/PLAN:   Face pain Her discomfort has largely resolved but she does have sensitivity to light in the left eye and left eye pain with leftward gaze and downward gaze.  The differential includes: Preseptal cellulitis, migraine, MS, eye trauma.  She is otherwise well-appearing and  has minor improvement in her symptoms therefore lower suspicion for infection. -Urgent referral to ophthalmology -Red flag symptoms discussed for return visits/ED visits     04/30/2020, MD [facility]

## 2020-05-02 NOTE — Assessment & Plan Note (Signed)
Her discomfort has largely resolved but she does have sensitivity to light in the left eye and left eye pain with leftward gaze and downward gaze.  The differential includes: Preseptal cellulitis, migraine, MS, eye trauma.  She is otherwise well-appearing and has minor improvement in her symptoms therefore lower suspicion for infection. -Urgent referral to ophthalmology -Red flag symptoms discussed for return visits/ED visits

## 2020-05-21 ENCOUNTER — Ambulatory Visit: Payer: Medicaid Other | Admitting: Family Medicine

## 2020-07-31 ENCOUNTER — Encounter: Payer: Self-pay | Admitting: Family Medicine

## 2020-07-31 ENCOUNTER — Other Ambulatory Visit: Payer: Self-pay

## 2020-07-31 ENCOUNTER — Ambulatory Visit (INDEPENDENT_AMBULATORY_CARE_PROVIDER_SITE_OTHER): Payer: Medicaid Other | Admitting: Family Medicine

## 2020-07-31 VITALS — BP 136/82 | HR 100 | Wt 208.8 lb

## 2020-07-31 DIAGNOSIS — R519 Headache, unspecified: Secondary | ICD-10-CM | POA: Diagnosis not present

## 2020-07-31 MED ORDER — KETOROLAC TROMETHAMINE 30 MG/ML IJ SOLN
30.0000 mg | Freq: Once | INTRAMUSCULAR | Status: AC
  Administered 2020-07-31: 30 mg via INTRAMUSCULAR

## 2020-07-31 NOTE — Patient Instructions (Addendum)
Good to see you today!  Thanks for coming in.  We will give you a Toradol shot now to help with the pain   I would remove your braids  Use the migraine medication as needed but should not take regularly  If the headache does not improve or you are having a lot of them then please come back to discuss other treatments   Continue exercise  [facility] your son gets better soon

## 2020-07-31 NOTE — Progress Notes (Signed)
    SUBJECTIVE:   CHIEF COMPLAINT / HPI:   HEADACHE  Headache started about one week ago on the left side.  Has improved some with OTC migraine medication but seems to recur  Pain is achy throbbing Keeps from doing:  Can function  Medications tried: migraine medication and ibuprofen  Head trauma: no Sudden onset: no Previous similar headaches: like migraines in past Taking blood thinners: no History of cancer: no  Symptoms Nose congestion stuffiness:  no Nausea vomiting: mild nausea Photophobia: yes with sunlight Noise sensitivity: mild Double vision or loss of vision: no Fever: no Neck Stiffness: no Trouble walking or speaking: no   PERTINENT  PMH / PSH: tasking thyroid replacement regularly  OBJECTIVE:   BP 136/82   Pulse 100   Wt 208 lb 12.8 oz (94.7 kg)   SpO2 99%   BMI 35.84 kg/m   Alert nad Mobility:able to get up and down from exam table without assistance or distress Neurologic exam : Cn 2-7 intact Strength equal & normal in upper & lower extremities Able to walk on heels and toes.   Balance normal  Romberg normal Eye - Pupils Equal Round Reactive to light, Extraocular movements intact, Fundi without hemorrhage or visible lesions, Conjunctiva without redness or discharge   ASSESSMENT/PLAN:   Headache Seems to be an atypical migraine vs tension headache.   I have considered and concluded the patient has a very low likelihood of having a CNS bleed (subdural or subarachnoid): Infection (meningitis/encephalitis):  Space occupying lesion (tumor or abscess): Temporal Arteritis: Glaucoma: or CO poisoning.  Treat with IM toradol today and as needed otc migraine medication       Carney Living, MD [facility]

## 2020-07-31 NOTE — Assessment & Plan Note (Signed)
Seems to be an atypical migraine vs tension headache.   I have considered and concluded the patient has a very low likelihood of having a CNS bleed (subdural or subarachnoid): Infection (meningitis/encephalitis):  Space occupying lesion (tumor or abscess): Temporal Arteritis: Glaucoma: or CO poisoning.  Treat with IM toradol today and as needed otc migraine medication

## 2020-09-09 ENCOUNTER — Other Ambulatory Visit: Payer: Self-pay | Admitting: Family Medicine

## 2020-11-11 ENCOUNTER — Telehealth (INDEPENDENT_AMBULATORY_CARE_PROVIDER_SITE_OTHER): Payer: Medicaid Other | Admitting: Family Medicine

## 2020-11-11 ENCOUNTER — Other Ambulatory Visit: Payer: Self-pay

## 2020-11-11 DIAGNOSIS — R197 Diarrhea, unspecified: Secondary | ICD-10-CM

## 2020-11-11 NOTE — Progress Notes (Signed)
[facility] Telemedicine Visit  Patient consented to have virtual visit and was identified by name and date of birth. Method of visit: Video  Encounter participants: Patient: Taylor Carson - located at home Provider: Lavonda Jumbo - located at home office Others (if applicable): N/A  Chief Complaint: Diarrhea  HPI: Experiencing diarrhea for 3 days. Started Saturday at 4 am x3 episodes and continued to occur until today with 2 episodes on Sunday and 2 today. They range from watery to formed pellets and appears to look like a burnt orange. She endorses diarrhea surrounding her menstrual cycle which has begun today but diarrhea usually happens with her cycle not before. She is concerned due to low urine output and dry skin. She endorses intake of water and tea. She denies nausea, vomiting, fever, sick contacts, and new/undercooked foods.    ROS: per HPI  Pertinent PMHx: Hypothyroidism  Exam:  There were no vitals taken for this visit.  General: Appears well, no acute distress. Age appropriate. Respiratory: normal effort  Assessment/Plan:  Diarrhea Acute x3 days ranging from watery to formed; non bloody. Prior hx of diarrhea with menstrual cycle although patient reporting diarrhea started prior to menstrual cycle this month. Endorsing associated symptoms of urine output and dry skin which is most concerning to her. Associated symptoms are likely due to dehydration but could consider repeating labs during office visit such as CBC, CMP, and TSH with hx of hypothyroidism. Last TSH elevated 04/30/20 at 5. Diarrhea does not sound infectious as patient denies recent use of antibiotics, uncooked food, or known sick contacts. Does not have fever, nausea, or vomiting. Would not treat with antibiotics a this time. If it is viral gastroenteritis and not hormonal related management remains the same as supportive care. Discussed continued hydration and removing tea form diet  as this could cause further dehydration. Pepto bismol if desired. Patient instructed to follow up if symptom persist beyond course of cycle and/or worsens.    Time spent during visit with patient: 17 minutes

## 2020-11-11 NOTE — Assessment & Plan Note (Addendum)
Acute x3 days ranging from watery to formed; non bloody. Prior hx of diarrhea with menstrual cycle although patient reporting diarrhea started prior to menstrual cycle this month. Endorsing associated symptoms of urine output and dry skin which is most concerning to her. Associated symptoms are likely due to dehydration but could consider repeating labs during office visit such as CBC, CMP, and TSH with hx of hypothyroidism. Last TSH elevated 04/30/20 at 5. Diarrhea does not sound infectious as patient denies recent use of antibiotics, uncooked food, or known sick contacts. Does not have fever, nausea, or vomiting. Would not treat with antibiotics a this time. If it is viral gastroenteritis and not hormonal related management remains the same as supportive care. Discussed continued hydration and removing tea form diet as this could cause further dehydration. Pepto bismol if desired. Patient instructed to follow up if symptom persist beyond course of cycle and/or worsens.

## 2020-11-14 ENCOUNTER — Ambulatory Visit: Payer: Medicaid Other

## 2020-11-14 NOTE — Progress Notes (Deleted)
    SUBJECTIVE:   CHIEF COMPLAINT / HPI:   ***  PERTINENT  PMH / PSH: Hypothyroid   OBJECTIVE:   There were no vitals taken for this visit.  ***  ASSESSMENT/PLAN:   No problem-specific Assessment & Plan notes found for this encounter.     Unknown Jim, DO [facility] [address] [facility]

## 2020-11-14 NOTE — Progress Notes (Deleted)
    SUBJECTIVE:   CHIEF COMPLAINT / HPI: "arm numbness"  Taylor Carson is a 30 year old female presenting for evaluation of bilateral arm numbness.  PERTINENT  PMH / PSH: Multinodular goiter, mood disorder, headaches  OBJECTIVE:   There were no vitals taken for this visit.  ***  ASSESSMENT/PLAN:   No problem-specific Assessment & Plan notes found for this encounter.     Allayne Stack, DO [place] [facility]

## 2020-11-15 ENCOUNTER — Ambulatory Visit: Payer: Medicaid Other

## 2021-01-07 ENCOUNTER — Ambulatory Visit: Payer: Medicaid Other | Admitting: Family Medicine

## 2021-01-07 ENCOUNTER — Other Ambulatory Visit: Payer: Self-pay

## 2021-01-07 DIAGNOSIS — G44219 Episodic tension-type headache, not intractable: Secondary | ICD-10-CM

## 2021-01-07 DIAGNOSIS — E890 Postprocedural hypothyroidism: Secondary | ICD-10-CM

## 2021-01-07 DIAGNOSIS — Z9889 Other specified postprocedural states: Secondary | ICD-10-CM

## 2021-01-07 DIAGNOSIS — Z720 Tobacco use: Secondary | ICD-10-CM | POA: Diagnosis not present

## 2021-01-07 NOTE — Patient Instructions (Signed)
Good to see you today - Thank you for coming in  Things we discussed today:  Thyroid - we will check blood.  I will let you know in mychart  Shoulder - use Tylenol and continue stretching.  Let me know if not slowly improving  Headache - exercise with walking abuot 20 minutes every day.  Use tylenol as needed.  If worsening or any new symptoms let me know  To keep you healthy - exercise - smoking cessation  - your health weight is around 160   Please always bring your medication bottles  Come back to see me in 12 months for a PAP smear

## 2021-01-07 NOTE — Assessment & Plan Note (Signed)
Stable Check labs 

## 2021-01-07 NOTE — Assessment & Plan Note (Signed)
Smokes 4 cigs per day.  Has plan to stop by very slowly cutting down

## 2021-01-07 NOTE — Assessment & Plan Note (Signed)
Consistent with mild tension type headache. No signs of intracranial lesions.  Continue as needed analgesics and regular exercise

## 2021-01-07 NOTE — Progress Notes (Signed)
    SUBJECTIVE:   CHIEF COMPLAINT / HPI:   THYROID Taking current 100 mcg dose for at least 3 months.  Is slowly losing weight  R Shoulder Neck Pain  Worse after work.  No weakness. Does not take anything.  Using yoga to loosen it  HEADACHE Intermittent mild.  Usually does not take anything.  No weakness or visual changes  Tobacco Smokes about 4 cigs per day and is slowly decreasing   PERTINENT  PMH / PSH: sp Thyroid surgery in 2020.  Her son is 28 yo   OBJECTIVE:   BP 110/80   Pulse 83   Wt 193 lb 3.2 oz (87.6 kg)   SpO2 100%   BMI 33.16 kg/m   Heart - Regular rate and rhythm.  No murmurs, gallops or rubs.    Lungs:  Normal respiratory effort, chest expands symmetrically. Lungs are clear to auscultation, no crackles or wheezes. R shoulder - FROM no weakness Eye - Pupils Equal Round Reactive to light, Extraocular movements intact, Fundi without hemorrhage or visible lesions, Conjunctiva without redness or discharge Neurologic exam : Cn 2-7 intact Strength equal & normal in upper & lower extremities Able to walk on heels and toes.   Balance normal  Romberg normal,   ASSESSMENT/PLAN:   Hypothyroid Stable Check labs   Headache Consistent with mild tension type headache. No signs of intracranial lesions.  Continue as needed analgesics and regular exercise   Tobacco abuse Smokes 4 cigs per day.  Has plan to stop by very slowly cutting down    HM Annual Examination   I reviewed the following patient responses on our Physical Exam Form Tobacco use and candidacy for lung cancer or aneurysm screening Alcohol Use  Weight  Exercise  Risk for STI  Fall risk Advanced Directive Increased family cancer risk  PHQ9 score reviewed  Blood pressure reviewed  I considered the following items based upon USPSTF recommendations: Colon cancer screening HIV testing:  Hepatitis C testing Cholesterol screening STI screening if high risk (Hepatitis B, Syphilis, Gonorrhea,  Chlamydia) Prostate Cancer if female at birth Cervical Cancer if female at birth Immunizations - Influenza, Covid, [place], Pneumonia, Tetanus  See After Visit Summary for recommendations   Carney Living, MD [facility]

## 2021-01-08 LAB — TSH: TSH: 2.15 u[IU]/mL (ref 0.450–4.500)

## 2021-01-08 LAB — T4, FREE: Free T4: 1.38 ng/dL (ref 0.82–1.77)

## 2021-02-21 ENCOUNTER — Other Ambulatory Visit: Payer: Self-pay | Admitting: Family Medicine

## 2021-05-27 ENCOUNTER — Other Ambulatory Visit: Payer: Self-pay | Admitting: Family Medicine

## 2021-08-07 ENCOUNTER — Ambulatory Visit: Payer: Medicaid Other | Admitting: Family Medicine

## 2021-08-07 ENCOUNTER — Other Ambulatory Visit: Payer: Self-pay

## 2021-08-07 VITALS — BP 130/77 | HR 74 | Ht 64.0 in | Wt 220.2 lb

## 2021-08-07 DIAGNOSIS — M25552 Pain in left hip: Secondary | ICD-10-CM

## 2021-08-07 NOTE — Assessment & Plan Note (Signed)
Signs and symptoms consistent with neuropathy versus a strained muscle.  Discussed treatment options and recommended scheduled NSAID use for 5 days followed by NSAID use as needed.  Consider use of gabapentin/muscle relaxers if symptoms do not resolve.  Patient given strict return precautions.  No further questions or concerns.

## 2021-08-07 NOTE — Patient Instructions (Signed)
It was great seeing you today.  Your pain seems like inflammation of the nerve and I want you to try scheduled ibuprofen 400 mg every 6-8 hours for the next 5 days.  You can take it as needed.  I want you to continue stretching but this may take 4 to 6 weeks to completely resolve.  If you have any questions or concerns or your symptoms worsen please call the clinic and schedule follow-up appointment.  I hope you have a wonderful afternoon!

## 2021-08-07 NOTE — Progress Notes (Signed)
    SUBJECTIVE:   CHIEF COMPLAINT / HPI:   Left low back, hip, knee pain Patient reports that she has had intermittent low back pain, hip pain, knee pain over the last month.  She reports that it is a shooting pain that usually starts in her left hip and will travel up to her low back as well as go down to her knee.  She describes the feeling as a sharp pain.  Possibly like electricity.  She reports that she did use a family members muscle relaxer with some relief but she says that she could feel the muscle relaxant that was still having the shooting pain.  Has tried stretching without much relief.  Has not tried any OTC medications.  No red flag symptoms such as urinary, bowel or bladder incontinence.   OBJECTIVE:   BP 130/77   Pulse 74   Ht 5\' 4"  (1.626 m)   Wt 220 lb 3.2 oz (99.9 kg)   SpO2 98%   BMI 37.80 kg/m   General: Well-appearing 30 year old female, no acute distress Cardiac: Regular rate and rhythm Respiratory: Normal work of breathing, speaking in full sentences MSK: Patient has tenderness to palpation to the lateral aspect of her left thigh which she reports the pain radiates down to her knee.  No decreased range of motion, no decreased strength in the lower extremity   ASSESSMENT/PLAN:   Left hip pain Signs and symptoms consistent with neuropathy versus a strained muscle.  Discussed treatment options and recommended scheduled NSAID use for 5 days followed by NSAID use as needed.  Consider use of gabapentin/muscle relaxers if symptoms do not resolve.  Patient given strict return precautions.  No further questions or concerns.     26, MD [facility]

## 2022-02-17 ENCOUNTER — Other Ambulatory Visit: Payer: Self-pay

## 2022-02-17 MED ORDER — LEVOTHYROXINE SODIUM 100 MCG PO TABS: 100.0000 ug | ORAL_TABLET | Freq: Every day | ORAL | 0 refills | Status: DC

## 2022-03-17 ENCOUNTER — Encounter: Payer: Medicaid Other | Admitting: Family Medicine

## 2022-03-17 ENCOUNTER — Encounter: Payer: Self-pay | Admitting: *Deleted

## 2022-03-17 NOTE — Progress Notes (Deleted)
    SUBJECTIVE:   CHIEF COMPLAINT / HPI:   PAP   L Hip pain (Oct 2022)   Hypothyroid Stable Check labs    Headache Consistent with mild tension type headache. No signs of intracranial lesions.  Continue as needed analgesics and regular exercise    Tobacco abuse Smokes 4 cigs per day.  Has plan to stop by very slowly cutting down     PERTINENT  PMH / PSH: ***  OBJECTIVE:   There were no vitals taken for this visit.  ***  ASSESSMENT/PLAN:   No problem-specific Assessment & Plan notes found for this encounter.     Carney Living, MD [facility]

## 2022-07-13 ENCOUNTER — Other Ambulatory Visit: Payer: Self-pay | Admitting: Family Medicine

## 2022-07-19 NOTE — Progress Notes (Deleted)
    SUBJECTIVE:   CHIEF COMPLAINT / HPI:    THYROID Taking current 100 mcg dose for at least 3 months.  Is slowly losing weight  MOOD     Tobacco Smokes about 4 cigs per day and is slowly decreasing    PERTINENT  PMH / PSH: ***  OBJECTIVE:   There were no vitals taken for this visit.  ***  ASSESSMENT/PLAN:   No problem-specific Assessment & Plan notes found for this encounter.     Lurine Imel L Devan Danzer, MD [facility]  

## 2022-07-20 ENCOUNTER — Ambulatory Visit: Payer: Medicaid Other | Admitting: Family Medicine

## 2022-07-20 DIAGNOSIS — E890 Postprocedural hypothyroidism: Secondary | ICD-10-CM

## 2022-07-27 ENCOUNTER — Ambulatory Visit: Payer: Medicaid Other | Admitting: Family Medicine

## 2022-07-27 NOTE — Progress Notes (Deleted)
    SUBJECTIVE:   CHIEF COMPLAINT / HPI:    THYROID Taking current 100 mcg dose for at least 3 months.  Is slowly losing weight  MOOD     Tobacco Smokes about 4 cigs per day and is slowly decreasing    PERTINENT  PMH / PSH: ***  OBJECTIVE:   There were no vitals taken for this visit.  ***  ASSESSMENT/PLAN:   No problem-specific Assessment & Plan notes found for this encounter.     Lind Covert, MD [place]

## 2022-07-28 ENCOUNTER — Inpatient Hospital Stay (HOSPITAL_COMMUNITY)
Admission: AD | Admit: 2022-07-28 | Discharge: 2022-08-01 | DRG: 885 | Disposition: A | Discharge status: Home / Self Care | Payer: Medicaid Other | Source: Intra-hospital | Attending: Psychiatry | Admitting: Psychiatry

## 2022-07-28 ENCOUNTER — Other Ambulatory Visit: Payer: Self-pay

## 2022-07-28 ENCOUNTER — Encounter (HOSPITAL_COMMUNITY): Payer: Self-pay | Admitting: Psychiatry

## 2022-07-28 ENCOUNTER — Ambulatory Visit (HOSPITAL_COMMUNITY)
Admission: EM | Admit: 2022-07-28 | Discharge: 2022-07-28 | Disposition: A | Discharge status: Other Acute Inpatient Hospital | Payer: Medicaid Other | Attending: Psychiatry | Admitting: Psychiatry

## 2022-07-28 DIAGNOSIS — F3181 Bipolar II disorder: Principal | ICD-10-CM

## 2022-07-28 DIAGNOSIS — Z87828 Personal history of other (healed) physical injury and trauma: Secondary | ICD-10-CM | POA: Diagnosis not present

## 2022-07-28 DIAGNOSIS — Z833 Family history of diabetes mellitus: Secondary | ICD-10-CM | POA: Diagnosis not present

## 2022-07-28 DIAGNOSIS — F314 Bipolar disorder, current episode depressed, severe, without psychotic features: Secondary | ICD-10-CM | POA: Diagnosis not present

## 2022-07-28 DIAGNOSIS — F17210 Nicotine dependence, cigarettes, uncomplicated: Secondary | ICD-10-CM | POA: Insufficient documentation

## 2022-07-28 DIAGNOSIS — Z8249 Family history of ischemic heart disease and other diseases of the circulatory system: Secondary | ICD-10-CM | POA: Diagnosis not present

## 2022-07-28 DIAGNOSIS — F332 Major depressive disorder, recurrent severe without psychotic features: Secondary | ICD-10-CM | POA: Insufficient documentation

## 2022-07-28 DIAGNOSIS — E890 Postprocedural hypothyroidism: Secondary | ICD-10-CM | POA: Diagnosis present

## 2022-07-28 DIAGNOSIS — Z79890 Hormone replacement therapy (postmenopausal): Secondary | ICD-10-CM | POA: Diagnosis not present

## 2022-07-28 DIAGNOSIS — F411 Generalized anxiety disorder: Secondary | ICD-10-CM | POA: Diagnosis not present

## 2022-07-28 DIAGNOSIS — G4700 Insomnia, unspecified: Secondary | ICD-10-CM | POA: Diagnosis present

## 2022-07-28 DIAGNOSIS — Z9151 Personal history of suicidal behavior: Secondary | ICD-10-CM | POA: Insufficient documentation

## 2022-07-28 DIAGNOSIS — Z79899 Other long term (current) drug therapy: Secondary | ICD-10-CM

## 2022-07-28 DIAGNOSIS — R45851 Suicidal ideations: Secondary | ICD-10-CM | POA: Diagnosis not present

## 2022-07-28 DIAGNOSIS — F329 Major depressive disorder, single episode, unspecified: Principal | ICD-10-CM | POA: Diagnosis present

## 2022-07-28 DIAGNOSIS — Z809 Family history of malignant neoplasm, unspecified: Secondary | ICD-10-CM

## 2022-07-28 DIAGNOSIS — Z1152 Encounter for screening for COVID-19: Secondary | ICD-10-CM | POA: Diagnosis not present

## 2022-07-28 DIAGNOSIS — E039 Hypothyroidism, unspecified: Secondary | ICD-10-CM | POA: Diagnosis present

## 2022-07-28 LAB — POCT URINE DRUG SCREEN - MANUAL ENTRY (I-SCREEN)

## 2022-07-28 LAB — RESP PANEL BY RT-PCR (FLU A&B, COVID) ARPGX2
Influenza A by PCR: NEGATIVE
Influenza B by PCR: NEGATIVE
SARS Coronavirus 2 by RT PCR: NEGATIVE

## 2022-07-28 LAB — CBC WITH DIFFERENTIAL/PLATELET
Abs Immature Granulocytes: 0.01 10*3/uL (ref 0.00–0.07)
Basophils Absolute: 0 10*3/uL (ref 0.0–0.1)
Basophils Relative: 0 %
Eosinophils Absolute: 0.1 10*3/uL (ref 0.0–0.5)
Eosinophils Relative: 2 %
HCT: 43.1 % (ref 36.0–46.0)
Hemoglobin: 14.4 g/dL (ref 12.0–15.0)
Immature Granulocytes: 0 %
Lymphocytes Relative: 31 %
Lymphs Abs: 2 10*3/uL (ref 0.7–4.0)
MCH: 29.7 pg (ref 26.0–34.0)
MCHC: 33.4 g/dL (ref 30.0–36.0)
MCV: 88.9 fL (ref 80.0–100.0)
Monocytes Absolute: 0.3 10*3/uL (ref 0.1–1.0)
Monocytes Relative: 5 %
Neutro Abs: 3.9 10*3/uL (ref 1.7–7.7)
Neutrophils Relative %: 62 %
Platelets: 295 10*3/uL (ref 150–400)
RBC: 4.85 MIL/uL (ref 3.87–5.11)
RDW: 12.6 % (ref 11.5–15.5)
WBC: 6.3 10*3/uL (ref 4.0–10.5)
nRBC: 0 % (ref 0.0–0.2)

## 2022-07-28 LAB — COMPREHENSIVE METABOLIC PANEL
ALT: 12 U/L (ref 0–44)
AST: 16 U/L (ref 15–41)
Albumin: 3.8 g/dL (ref 3.5–5.0)
Alkaline Phosphatase: 57 U/L (ref 38–126)
Anion gap: 8 (ref 5–15)
BUN: 6 mg/dL (ref 6–20)
CO2: 24 mmol/L (ref 22–32)
Calcium: 9.2 mg/dL (ref 8.9–10.3)
Chloride: 107 mmol/L (ref 98–111)
Creatinine, Ser: 0.95 mg/dL (ref 0.44–1.00)
GFR, Estimated: >60 mL/min (ref >60)
Glucose, Bld: 86 mg/dL (ref 70–99)
Potassium: 3.9 mmol/L (ref 3.5–5.1)
Sodium: 139 mmol/L (ref 135–145)
Total Bilirubin: <0.1 mg/dL — ABNORMAL LOW (ref 0.3–1.2)
Total Protein: 7.6 g/dL (ref 6.5–8.1)

## 2022-07-28 LAB — LIPID PANEL
Cholesterol: 181 mg/dL (ref 0–200)
HDL: 53 mg/dL (ref >40)
LDL Cholesterol: 119 mg/dL — ABNORMAL HIGH (ref 0–99)
Total CHOL/HDL Ratio: 3.4 RATIO
Triglycerides: 43 mg/dL (ref <150)
VLDL: 9 mg/dL (ref 0–40)

## 2022-07-28 LAB — ETHANOL: Alcohol, Ethyl (B): <10 mg/dL (ref <10)

## 2022-07-28 LAB — HEMOGLOBIN A1C
Hgb A1c MFr Bld: 5.4 % (ref 4.8–5.6)
Mean Plasma Glucose: 108.28 mg/dL

## 2022-07-28 LAB — TSH: TSH: 6.596 u[IU]/mL — ABNORMAL HIGH (ref 0.350–4.500)

## 2022-07-28 MED ORDER — ACETAMINOPHEN 325 MG PO TABS: 650.0000 mg | ORAL_TABLET | Freq: Four times a day (QID) | ORAL | Status: DC | PRN

## 2022-07-28 MED ORDER — ALUM & MAG HYDROXIDE-SIMETH 200-200-20 MG/5ML PO SUSP: 30.0000 mL | ORAL | Status: DC | PRN

## 2022-07-28 MED ORDER — TRAZODONE HCL 50 MG PO TABS: 50.0000 mg | ORAL_TABLET | Freq: Every evening | ORAL | Status: DC | PRN

## 2022-07-28 MED ORDER — NICOTINE 21 MG/24HR TD PT24
21.0000 mg | MEDICATED_PATCH | Freq: Every day | TRANSDERMAL | Status: DC
  Filled 2022-07-28 (×6): qty 1

## 2022-07-28 MED ORDER — HYDROXYZINE HCL 25 MG PO TABS: 25.0000 mg | ORAL_TABLET | Freq: Three times a day (TID) | ORAL | Status: DC | PRN

## 2022-07-28 MED ORDER — MAGNESIUM HYDROXIDE 400 MG/5ML PO SUSP: 30.0000 mL | Freq: Every day | ORAL | Status: DC | PRN

## 2022-07-28 MED ORDER — TRAZODONE HCL 50 MG PO TABS
50.0000 mg | ORAL_TABLET | Freq: Every evening | ORAL | Status: DC | PRN
  Administered 2022-07-29: 50 mg via ORAL
  Filled 2022-07-28 (×2): qty 1

## 2022-07-28 MED ORDER — LEVOTHYROXINE SODIUM 100 MCG PO TABS
100.0000 ug | ORAL_TABLET | Freq: Every day | ORAL | Status: DC
  Administered 2022-07-29 – 2022-08-01 (×4): 100 ug via ORAL
  Filled 2022-07-28 (×7): qty 1

## 2022-07-28 MED ORDER — HYDROXYZINE HCL 25 MG PO TABS
25.0000 mg | ORAL_TABLET | Freq: Three times a day (TID) | ORAL | Status: DC | PRN
  Administered 2022-07-29 – 2022-07-30 (×2): 25 mg via ORAL
  Filled 2022-07-28 (×4): qty 1

## 2022-07-28 NOTE — ED Notes (Signed)
Report called and given to nurse Lorriane Shire at [facility], Shortly thereafter Safe Transport was notfied.

## 2022-07-28 NOTE — ED Notes (Signed)
Safe transport arrived to take Taylor Carson to [facility], the necessary paperwork was transported with her and she received her personal belongings.

## 2022-07-28 NOTE — BHH Group Notes (Signed)
Pt did not attend wrap up group this evening. Pt was in their room sleeping. 

## 2022-07-28 NOTE — BH Assessment (Signed)
Comprehensive Clinical Assessment (CCA) Note  07/28/2022 Taylor Carson [id number]  Disposition: Per Lauree Chandler, NP inpatient treatment is recommended.  BHH to review.  Disposition SW to pursue appropriate inpatient options.  The patient demonstrates the following risk factors for suicide: Chronic risk factors for suicide include: psychiatric disorder of Major Depressive Disorder, recurrent, severe and previous suicide attempts x1 by overdose 108yrs ago . Acute risk factors for suicide include: social withdrawal/isolation and loss (financial, interpersonal, professional). Protective factors for this patient include: positive social support and hope for the future. Considering these factors, the overall suicide risk at this point appears to be moderate. Patient is appropriate for outpatient follow up once stabilized.   Patient is a 31 year old female with a history of untreated depression who presents voluntarily via [employer] to Behavioral Health Urgent Care for assessment.  Patient contacted [employer] due to worsening depression symptoms, stating she was feeling overwhelmed.  She informed officers she was having suicidal thoughts with a plan harm herself using a knife. Patient stated, "I wish someone would do it for me". Patient reports increased depression over the past month due to not being able to care for her child at the moment due to financial issues.  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  The center recently closed and patient has been unable to find another option for her son.  Her son's father found out patient was taking him to work with her and he asked that the son live with him for now.  He has been with his father in [place] now for 2-3 mos and patient has been feeling like a "failure."  She indicated he was her purpose for living.  Patient has a hx of a suicide attempt in the past about 5 years ago by overdose.  She was admitted for 24  hour observation.  No other psychiatric treatment history.  She shares depression has been worsening for the past two years.  Patient states she did not feel safe with the suicidal thoughts this morning and decided to call for help.   Pt denies HI and AVH.  She admits to occasional THC use, most recent use was yesterday.  Treatment options were discussed and inpatient treatment has been recommended.  Patient is agreement with this recommendations.   Chief Complaint: Worsening depression, SI with plan  Visit Diagnosis: Major Depressive Disorder, recurrent severe, untreated  Flowsheet Row ED from 07/28/2022 in [facility] Office Visit from 08/07/2021 in [facility] Office Visit from 07/31/2020 in [facility]  Thoughts that you would be better off dead, or of hurting yourself in some way More than half the days Not at all Not at all  PHQ-9 Total Score 19 5 8       Flowsheet Row ED from 07/28/2022 in [facility]  C-SSRS RISK CATEGORY High Risk        CCA Screening, Triage and Referral (STR)  Patient Reported Information How did you hear about [facility]? Legal System  What Is the Reason for Your Visit/Call Today? Pt presents to [facility] escorted by [employer] due to worsening depression symptoms. Pt reports SI with a plan to harm herself using a knife. Pt states "I wish someone would do it for me". Pt reports increased depression over the past month due to not being able to care for her child at  the moment due to financial issues. pt reports suicide attempt in the past about 5 years ago. Pt denies HI and AVH  How Long Has This Been Causing You Problems? 1 wk - 1 month  What Do You Feel Would Help You the Most Today? Treatment for Depression or other mood problem   Have You Recently Had Any Thoughts About Hurting Yourself? Yes  Are You Planning to Commit Suicide/Harm Yourself At This time? Yes   Have you  Recently Had Thoughts About Hurting Someone Taylor Carson? No  Are You Planning to Harm Someone at This Time? No  Explanation: No data recorded  Have You Used Any Alcohol or Drugs in the Past 24 Hours? Yes  How Long Ago Did You Use Drugs or Alcohol? No data recorded What Did You Use and How Much? THC, "1 blunt"   Do You Currently Have a Therapist/Psychiatrist? No  Name of Therapist/Psychiatrist: No data recorded  Have You Been Recently Discharged From Any Office Practice or Programs? No  Explanation of Discharge From Practice/Program: No data recorded    CCA Screening Triage Referral Assessment Type of Contact: Face-to-Face  Telemedicine Service Delivery:   Is this Initial or Reassessment? No data recorded Date Telepsych consult ordered in CHL:  No data recorded Time Telepsych consult ordered in CHL:  No data recorded Location of Assessment: [facility] Assessment Services  Provider Location: [facility] Assessment Services   Collateral Involvement: N/A   Does Patient Have a Stage manager Guardian? No  Legal Guardian Contact Information: No data recorded Copy of Legal Guardianship Form: No data recorded Legal Guardian Notified of Arrival: No data recorded Legal Guardian Notified of Pending Discharge: No data recorded If Minor and Not Living with Parent(s), Who has Custody? No data recorded Is CPS involved or ever been involved? Never  Is APS involved or ever been involved? Never   Patient Determined To Be At Risk for Harm To Self or Others Based on Review of Patient Reported Information or Presenting Complaint? Yes, for Self-Harm  Method: No data recorded Availability of Means: No data recorded Intent: No data recorded Notification Required: No data recorded Additional Information for Danger to Others Potential: No data recorded Additional Comments for Danger to Others Potential: No data recorded Are There Guns or Other Weapons in Your Home? No data recorded Types of  Guns/Weapons: No data recorded Are These Weapons Safely Secured?                            No data recorded Who Could Verify You Are Able To Have These Secured: No data recorded Do You Have any Outstanding Charges, Pending Court Dates, Parole/Probation? No data recorded Contacted To Inform of Risk of Harm To Self or Others: Other: Comment [facility] provider aware)    Does Patient Present under Involuntary Commitment? No  IVC Papers Initial File Date: No data recorded  [place] of Residence: [place]   Patient Currently Receiving the Following Services: Not Receiving Services   Determination of Need: Emergent (2 hours)   Options For Referral: Inpatient Hospitalization     CCA Biopsychosocial Patient Reported Schizophrenia/Schizoaffective Diagnosis in Past: No   Strengths: Patient is seeking treatment. Patient has support   Mental Health Symptoms Depression:   Difficulty Concentrating; Hopelessness; Tearfulness; Worthlessness   Duration of Depressive symptoms:  Duration of Depressive Symptoms: Greater than two weeks   Mania:   None   Anxiety:    Worrying; Tension  Psychosis:   None   Duration of Psychotic symptoms:    Trauma:   None   Obsessions:   None   Compulsions:   None   Inattention:   N/A   Hyperactivity/Impulsivity:   N/A   Oppositional/Defiant Behaviors:   N/A   Emotional Irregularity:   Chronic feelings of emptiness   Other Mood/Personality Symptoms:  No data recorded   Mental Status Exam Appearance and self-care  Stature:   Average   Weight:   Overweight   Clothing:   Casual   Grooming:   Normal   Cosmetic use:   Age appropriate   Posture/gait:   Normal   Motor activity:   Not Remarkable   Sensorium  Attention:   Normal   Concentration:   Focuses on irrelevancies   Orientation:   X5   Recall/memory:   Normal   Affect and Mood  Affect:   Depressed; Flat   Mood:   Depressed   Relating  Eye contact:    Normal   Facial expression:   Depressed   Attitude toward examiner:   Cooperative   Thought and Language  Speech flow:  Clear and Coherent   Thought content:   Appropriate to Mood and Circumstances   Preoccupation:   None   Hallucinations:   None   Organization:   Intact   Company secretary of Knowledge:   Average   Intelligence:   Average   Abstraction:   Normal   Judgement:   Impaired   Reality Testing:   Adequate   Insight:   Gaps   Decision Making:   Normal   Social Functioning  Social Maturity:   Responsible   Social Judgement:   Normal   Stress  Stressors:   Grief/losses; Financial   Coping Ability:   Exhausted   Skill Deficits:   Decision making; Interpersonal; Self-control   Supports:   Family; Friends/Service system     Religion: Religion/Spirituality Are You A Religious Person?: No  Leisure/Recreation: Leisure / Recreation Do You Have Hobbies?: No  Exercise/Diet: Exercise/Diet Do You Exercise?: No Have You Gained or Lost A Significant Amount of Weight in the Past Six Months?: No Do You Follow a Special Diet?: No Do You Have Any Trouble Sleeping?: Yes Explanation of Sleeping Difficulties: sleeps, however still feels tired upon waking   CCA Employment/Education Employment/Work Situation: Employment / Work Situation Employment Situation: Employed Work Stressors: Pt reports lots of turn over at [employer] where she works full time. Patient's Job has Been Impacted by Current Illness: No Has Patient ever Been in the [employer]?: No  Education: Education Is Patient Currently Attending School?: No Last Grade Completed: 12 Did You Attend College?: No Did You Have An Individualized Education Program (IIEP): No Did You Have Any Difficulty At School?: No Patient's Education Has Been Impacted by Current Illness: No   CCA Family/Childhood History Family and Relationship History: Family history Marital  status: Single Does patient have children?: Yes How many children?: 1 How is patient's relationship with their children?: 4 y.o. son, no concerns noted  Childhood History:  Childhood History By whom was/is the patient raised?: Mother, Father Did patient suffer any verbal/emotional/physical/sexual abuse as a child?: No Did patient suffer from severe childhood neglect?: No Has patient ever been sexually abused/assaulted/raped as an adolescent or adult?: No Was the patient ever a victim of a crime or a disaster?: No Witnessed domestic violence?: No Has patient been affected by domestic violence as an adult?: No  Child/Adolescent Assessment:     CCA Substance Use Alcohol/Drug Use: Alcohol / Drug Use Pain Medications: See MAR Prescriptions: See MAR Over the Counter: See MAR History of alcohol / drug use?: No history of alcohol / drug abuse Longest period of sobriety (when/how long): THC - occasional use Withdrawal Symptoms: None                         ASAM's:  Six Dimensions of Multidimensional Assessment  Dimension 1:  Acute Intoxication and/or Withdrawal Potential:      Dimension 2:  Biomedical Conditions and Complications:      Dimension 3:  Emotional, Behavioral, or Cognitive Conditions and Complications:     Dimension 4:  Readiness to Change:     Dimension 5:  Relapse, Continued use, or Continued Problem Potential:     Dimension 6:  Recovery/Living Environment:     ASAM Severity Score:    ASAM Recommended Level of Treatment:     Substance use Disorder (SUD)    Recommendations for Services/Supports/Treatments:    Discharge Disposition:    DSM5 Diagnoses: Patient Active Problem List   Diagnosis Date Noted   Left hip pain 08/07/2021   S/P thyroidectomy 01/07/2021   Tobacco abuse 01/07/2021   Headache 05/01/2020   Hypothyroid 07/18/2019   Mood disorder (HCC) 07/28/2017     Referrals to Alternative Service(s): Referred to Alternative Service(s):    Place:   Date:   Time:    Referred to Alternative Service(s):   Place:   Date:   Time:    Referred to Alternative Service(s):   Place:   Date:   Time:    Referred to Alternative Service(s):   Place:   Date:   Time:     Yetta Glassman, [facility]
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
## 2022-07-28 NOTE — Progress Notes (Signed)
Pt was voluntarily admitted to [facility] on 07/28/22 at 1700. Pt was admitted due to SI with a plan to cut herself with a knife. Pt's main stressors are her socioeconomic status and her son. Pt reports "My son is living with his dad in [place] while I am getting my feet in the ground. I am very sad being apart from my son but I am unable to support myself without his father. I am 31 and feel like I am getting nowhere in life". Pt reports that she lives with her mother in an apartment. Pt reported passive SI and stated "I wish someone else would just do it for me". Pt verbally contracted for safety. Pt denied HI/AVH. Pt was tearful throughout the admission. Pt reports that her main support is her father. Pt states she is feeling helpless and wants to learn coping mechanisms while she is here to prevent herself from getting to this point in the future. Pt oriented to unit and provided with toiletries and linens. Pt's belongings locked in locker and brought to beside. Pt is safe on the unit at this time.

## 2022-07-28 NOTE — Progress Notes (Signed)
Pt was accepted to [facility] 07/28/22; Bed Assignment 301-1 PENDING labs, covid PCR, EKG, UDS,VS, and voluntary consent signed and faxed to [phone]. Once resulted report can be called to [phone].  Pt meets inpatient criteria per Tharon Aquas,   Attending Physician will be Dr. Janine Limbo  Report can be called to: -Adult unit: [phone]  Pt can arrive after  Care Team notified: [facility] Lynnda Shields, RN, Tharon Aquas, NP,Eric [place], RN  [place], [place] 07/28/2022 @ 12:46 PM

## 2022-07-28 NOTE — Progress Notes (Signed)
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     07/28/22 2200  Psych Admission Type (Psych Patients Only)  Admission Status Voluntary  Psychosocial Assessment  Patient Complaints Anxiety;Depression  Eye Contact Fair  Facial Expression Sad  Affect Anxious;Depressed  Speech Soft  Interaction Assertive  Motor Activity Slow  Appearance/Hygiene Unremarkable  Behavior Characteristics Cooperative;Anxious  Mood Depressed;Anxious  Aggressive Behavior  Effect No apparent injury  Thought Process  Coherency WDL  Content WDL  Delusions WDL  Perception WDL  Hallucination None reported or observed  Judgment Impaired  Confusion WDL  Danger to Self  Current suicidal ideation? Passive  Self-Injurious Behavior Some self-injurious ideation observed or expressed.  No lethal plan expressed   Agreement Not to Harm Self Yes  Description of Agreement verbal contract for safety

## 2022-07-28 NOTE — Progress Notes (Signed)
Received Taylor Carson in the assessment room, she was cooperative with the admission process. She endorsed feeling depressed, anxious and suicidal ideations with thoughts of cutting her wrist. She was relocated to the OBS area, oriented to her new environment and given nourishments per her request. She was tearful at times. She was informed of her impending transfer to [facility] after  her covid and labs results.

## 2022-07-28 NOTE — Tx Team (Signed)
Initial Treatment Plan 07/28/2022 6:02 PM Taylor Carson [id number]    PATIENT STRESSORS: Financial difficulties   Marital or family conflict     PATIENT STRENGTHS: Ability for insight  Hydrographic surveyor for treatment/growth  Physical Health    PATIENT IDENTIFIED PROBLEMS:   Suicidal Ideation    Family Conflict    Depression    Anxiety    Lack of Coping Skills   DISCHARGE CRITERIA:  Improved stabilization in mood, thinking, and/or behavior Motivation to continue treatment in a less acute level of care Safe-care adequate arrangements made  PRELIMINARY DISCHARGE PLAN: Attend aftercare/continuing care group Participate in family therapy Return to previous living arrangement  PATIENT/FAMILY INVOLVEMENT: This treatment plan has been presented to and reviewed with the patient, Taylor Carson. The patient has been given the opportunity to ask questions and make suggestions.  Lurline Del Alexee Delsanto, RN 07/28/2022, 6:02 PM

## 2022-07-29 ENCOUNTER — Telehealth (HOSPITAL_COMMUNITY): Payer: Self-pay | Admitting: Family Medicine

## 2022-07-29 ENCOUNTER — Encounter (HOSPITAL_COMMUNITY): Payer: Self-pay

## 2022-07-29 DIAGNOSIS — F3181 Bipolar II disorder: Principal | ICD-10-CM

## 2022-07-29 DIAGNOSIS — F411 Generalized anxiety disorder: Secondary | ICD-10-CM

## 2022-07-29 DIAGNOSIS — F332 Major depressive disorder, recurrent severe without psychotic features: Secondary | ICD-10-CM | POA: Diagnosis not present

## 2022-07-29 MED ORDER — ARIPIPRAZOLE 5 MG PO TABS
5.0000 mg | ORAL_TABLET | Freq: Every day | ORAL | Status: DC
  Administered 2022-07-29: 5 mg via ORAL
  Filled 2022-07-29 (×4): qty 1

## 2022-07-29 NOTE — Group Note (Signed)
Recreation Therapy Group Note   Group Topic:Stress Management  Group Date: 07/29/2022 Start Time: 0930 End Time: 0950 Facilitators: Raenette Sakata-McCall, LRT,CTRS Location: [address] Dayroom   Goal Area(s) Addresses:  Patient will identify positive stress management techniques. Patient will identify benefits of using stress management post d/c.  Group Description:  Meditation.  LRT played a meditation that focused on having a mindful morning.  Patients were to listen and follow along as meditation played to fully engage in the group session.  Patients were to relax and get as comfortable as possible to get the full experience of the meditation.   Affect/Mood: N/A   Participation Level: Did not attend    Clinical Observations/Individualized Feedback:     Plan: Continue to engage patient in RT group sessions 2-3x/week.   Taylor Carson, LRT,CTRS 07/29/2022 11:43 AM

## 2022-07-29 NOTE — [facility] Suicide Risk Assessment (Signed)
[place] INPATIENT:  Family/Significant Other Suicide Prevention Education  Suicide Prevention Education:  Education Completed; Lolita Rieger, mother, [phone] (name of family member/significant other) has been identified by the patient as the family member/significant other with whom the patient will be residing, and identified as the person(s) who will aid the patient in the event of a mental health crisis (suicidal ideations/suicide attempt).  With written consent from the patient, the family member/significant other has been provided the following suicide prevention education, prior to the and/or following the discharge of the patient.  In addition to the precautions below, patient's support agrees to monitor patient for safety, ensure treatment compliance, and alert emergency services should patient require such services. Mother states she has no specific concerns about patient's safety.   The suicide prevention education provided includes the following: Suicide risk factors Suicide prevention and interventions National Suicide Hotline telephone number [facility] assessment telephone number [facility] Emergency Assistance [place] and/or Residential Mobile Crisis Unit telephone number  Request made of family/significant other to: Remove weapons (e.g., guns, rifles, knives), all items previously/currently identified as safety concern.   Remove drugs/medications (over-the-counter, prescriptions, illicit drugs), all items previously/currently identified as a safety concern.  The family member/significant other verbalizes understanding of the suicide prevention education information provided.  The family member/significant other agrees to remove the items of safety concern listed above.  Durenda Hurt 07/29/2022, 1:59 PM
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
## 2022-07-29 NOTE — Plan of Care (Signed)
  Problem: Education: Goal: Ability to state activities that reduce stress will improve Outcome: Progressing   Problem: Coping: Goal: Ability to identify and develop effective coping behavior will improve Outcome: Progressing   Problem: Self-Concept: Goal: Level of anxiety will decrease Outcome: Progressing   Problem: Education: Goal: Emotional status will improve Outcome: Progressing   Problem: Education: Goal: Mental status will improve Outcome: Progressing   Problem: Coping: Goal: Ability to demonstrate self-control will improve Outcome: Progressing

## 2022-07-29 NOTE — BH Assessment (Signed)
Care Management - Follow Up [facility] Discharges   Patient has been placed in an inpatient psychiatric hospital ([facility]) on 07-28-2022. 

## 2022-07-29 NOTE — Group Note (Signed)
LCSW Group Therapy Note  Group Date: 07/29/2022 Start Time: 1300 End Time: 1400   Type of Therapy and Topic:  Group Therapy: Positive Affirmations  Participation Level:  Did Not Attend   Description of Group:   This group addressed positive affirmation towards self and others.  Patients went around the room and identified two positive things about themselves and two positive things about a peer in the room.  Patients reflected on how it felt to share something positive with others, to identify positive things about themselves, and to hear positive things from others/ Patients were encouraged to have a daily reflection of positive characteristics or circumstances.   Therapeutic Goals: Patients will verbalize two of their positive qualities Patients will demonstrate empathy for others by stating two positive qualities about a peer in the group Patients will verbalize their feelings when voicing positive self affirmations and when voicing positive affirmations of others Patients will discuss the potential positive impact on their wellness/recovery of focusing on positive traits of self and others  Therapeutic Modalities:   Cognitive Behavioral Therapy Motivational Interviewing    Windle Guard, LCSW 07/29/2022  3:32 PM
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
## 2022-07-29 NOTE — BH IP Treatment Plan (Signed)
Interdisciplinary Treatment and Diagnostic Plan Update  07/29/2022 Time of Session: [place] MRN: [id number]  Principal Diagnosis: MDD (major depressive disorder)  Secondary Diagnoses: Principal Problem:   MDD (major depressive disorder)   Current Medications:  Current Facility-Administered Medications  Medication Dose Route Frequency Provider Last Rate Last Admin   acetaminophen (TYLENOL) tablet 650 mg  650 mg Oral Q6H PRN Tharon Aquas, NP       alum & mag hydroxide-simeth (MAALOX/MYLANTA) 200-200-20 MG/5ML suspension 30 mL  30 mL Oral Q4H PRN Tharon Aquas, NP       hydrOXYzine (ATARAX) tablet 25 mg  25 mg Oral TID PRN Tharon Aquas, NP       levothyroxine (SYNTHROID) tablet 100 mcg  100 mcg Oral QAC breakfast Tharon Aquas, NP   100 mcg at 07/29/22 0973   magnesium hydroxide (MILK OF MAGNESIA) suspension 30 mL  30 mL Oral Daily PRN Tharon Aquas, NP       nicotine (NICODERM CQ - dosed in mg/24 hours) patch 21 mg  21 mg Transdermal Q0600 Tharon Aquas, NP       traZODone (DESYREL) tablet 50 mg  50 mg Oral QHS PRN Tharon Aquas, NP       PTA Medications: Medications Prior to Admission  Medication Sig Dispense Refill Last Dose   levothyroxine (SYNTHROID) 100 MCG tablet Take 1 tablet (100 mcg total) by mouth daily before breakfast. 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   Physician Treatment Plan for Primary Diagnosis: MDD (major depressive disorder) Long Term Goal(s):     Short Term Goals:    Medication Management: Evaluate patient's response, side effects, and tolerance  of medication regimen.  Therapeutic Interventions: 1 to 1 sessions, Unit Group sessions and Medication administration.  Evaluation of Outcomes: Progressing  Physician Treatment Plan for Secondary Diagnosis: Principal Problem:   MDD (major depressive disorder)  Long Term Goal(s):     Short Term Goals:       Medication Management: Evaluate patient's response, side effects, and tolerance of medication regimen.  Therapeutic Interventions: 1 to 1 sessions, Unit Group sessions and Medication administration.  Evaluation of Outcomes: Progressing   RN Treatment Plan for Primary Diagnosis: MDD (major depressive disorder) Long Term Goal(s): Knowledge of disease and therapeutic regimen to maintain health will improve  Short Term Goals: Ability to remain free from injury will improve, Ability to verbalize frustration and anger appropriately will improve, Ability to demonstrate self-control, Ability to participate in decision making will improve, Ability to verbalize feelings will improve, Ability to disclose and discuss suicidal ideas, Ability to identify and develop effective coping behaviors will improve, and Compliance with prescribed medications will improve  Medication Management: RN will administer medications as ordered by provider, will assess and evaluate patient's response and provide education to patient for prescribed medication. RN will report any adverse and/or side effects to prescribing provider.  Therapeutic Interventions: 1 on 1 counseling sessions, Psychoeducation, Medication administration, Evaluate responses to treatment, Monitor vital signs and CBGs as ordered, Perform/monitor CIWA, COWS, AIMS and Fall Risk screenings as ordered, Perform wound care treatments as ordered.  Evaluation of Outcomes: Progressing  LCSW Treatment Plan for Primary Diagnosis: MDD (major depressive disorder) Long Term Goal(s): Safe transition to appropriate next level of care at discharge, Engage  patient in therapeutic group addressing interpersonal concerns.  Short Term Goals: Engage patient in aftercare planning with referrals and resources, Increase social support, Increase ability to appropriately verbalize feelings, Increase emotional regulation, Facilitate acceptance of mental health diagnosis and concerns, Facilitate patient progression through stages of change regarding substance use diagnoses and concerns, Identify triggers associated with mental health/substance abuse issues, and Increase skills for wellness and recovery  Therapeutic Interventions: Assess for all discharge needs, 1 to 1 time with Social worker, Explore available resources and support systems, Assess for adequacy in community support network, Educate family and significant other(s) on suicide prevention, Complete Psychosocial Assessment, Interpersonal group therapy.  Evaluation of Outcomes: Progressing   Progress in Treatment: Attending groups: No. Participating in groups: No. Taking medication as prescribed: Yes. Toleration medication: Yes. Family/Significant other contact made: No, will contact:  CSW to obtain consent to reach family/friend Patient understands diagnosis: Yes. Discussing patient identified problems/goals with staff: Yes. Medical problems stabilized or resolved: Yes. Denies suicidal/homicidal ideation: No. Issues/concerns per patient self-inventory: Yes. Other: none  New problem(s) identified: No, Describe:  none  New Short Term/Long Term Goal(s): Patient to work towards medication management for mood stabilization; elimination of SI thoughts; development of comprehensive mental wellness plan.  Patient Goals:  Patient states their goal for treatment is to "find a better way to cope with my emotions."  Discharge Plan or Barriers: No psychosocial barriers identified at this time, patient to return to place of residence when appropriate for discharge.   Reason for Continuation of  Hospitalization: Depression  Estimated Length of Stay: 197 days  Last 3 [place] Suicide Severity Risk Score: Flowsheet Row [facility]Current[facility] Most recent reading at 07/28/2022  5:52 PM ED from 07/28/2022 in [facility] Most recent reading at 07/28/2022  1:31 PM  C-SSRS RISK CATEGORY High Risk High Risk       Last PHQ 2/9 Scores:    07/28/2022   12:41 PM 08/07/2021   11:18 AM 07/31/2020   10:02 AM  Depression screen PHQ 2/9  Decreased Interest 2 2 2   Down, Depressed, Hopeless 2 0 1  PHQ - 2 Score 4 2 3   Altered sleeping 1 2 2   Tired, decreased energy 2 0 1  Change in appetite 3 0 2  Feeling bad or failure about yourself  3 0 0  Trouble concentrating 3 1 0  Moving slowly or fidgety/restless 1 0 0  Suicidal thoughts 2 0 0  PHQ-9 Score 19 5 8   Difficult doing work/chores Very difficult  Somewhat difficult    Scribe for Treatment Team: 07/29/2022 12:48 PM

## 2022-07-29 NOTE — Progress Notes (Signed)
   07/29/22 0500  Sleep  Number of Hours 9.25

## 2022-07-29 NOTE — Progress Notes (Signed)
   07/29/22 1239  Psych Admission Type (Psych Patients Only)  Admission Status Voluntary  Psychosocial Assessment  Patient Complaints Anxiety;Depression  Eye Contact Fair  Facial Expression Flat  Affect Anxious  Speech Soft  Interaction Assertive  Motor Activity Other (Comment) (WNL)  Appearance/Hygiene Unremarkable  Behavior Characteristics Cooperative;Calm  Mood Depressed  Thought Process  Coherency WDL  Content WDL  Delusions None reported or observed  Perception WDL  Hallucination None reported or observed  Judgment Impaired  Confusion WDL  Danger to Self  Current suicidal ideation? Denies  Self-Injurious Behavior No self-injurious ideation or behavior indicators observed or expressed   Agreement Not to Harm Self Yes  Description of Agreement verbal  Danger to Others  Danger to Others None reported or observed

## 2022-07-29 NOTE — H&P (Signed)
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 verbalize feelings will improve, Ability to disclose and discuss suicidal ideas, Ability to demonstrate self-control will improve, Ability to identify and develop effective coping behaviors will improve, Ability to maintain clinical measurements within normal limits will improve, Compliance with prescribed medications will improve, and Ability to identify triggers associated with substance abuse/mental health issues will improve  Plan:  Safety and  Monitoring: Voluntary admission to inpatient psychiatric unit for safety, stabilization and treatment Daily contact with patient to assess and evaluate symptoms and progress in treatment Patient's case to be discussed in multi-disciplinary team meeting Observation Level : q15 minute checks Vital signs: q12 hours Precautions: suicide, elopement, and assault   Diagnosis:  Principal Problem:   MDD (major depressive disorder), recurrent severe, without psychosis (HCC)  #Major depressive disorder, recurrent severe, without psychosis -Start Abilify 5 mg daily for depression and mood stability  As needed medications: -Patient to start taking Tylenol 650 mg every 6 hours as needed for mild pain -Patient to start taking Maalox/Mylanta 30 mL every 4 hours as needed for indigestion -Patient to start taking Milk of Magnesia 30 mL as needed for mild constipation -Patient to start hydroxyzine 25 mg 3 times daily as needed for anxiety -Patient to start trazodone 50 mg at bedtime as needed for insomnia   #Hypothyroidism -Continue levothyroxine 100 mcg daily before breakfast  #Tobacco dependence -Start NicoDerm CQ 21 mg / 24-hour patch   Discharge Planning: Social work and case management to assist with discharge planning and identification of hospital follow-up needs prior to discharge Estimated LOS: 5-7 days Discharge Concerns: Need to establish a safety plan; Medication compliance and effectiveness Discharge Goals: Return home with outpatient referrals for mental health follow-up including medication management/psychotherapy   I certify that inpatient services furnished can reasonably be expected to improve the patient's condition.    Meta Hatchet, PA 10/18/20233:48 PM

## 2022-07-29 NOTE — [facility] Group Notes (Signed)
Patient did not attend NA meeting.  

## 2022-07-29 NOTE — Plan of Care (Signed)
  Problem: Activity: Goal: Interest or engagement in activities will improve Outcome: Progressing   Problem: Coping: Goal: Coping ability will improve Outcome: Progressing   Problem: Self-Concept: Goal: Ability to disclose and discuss suicidal ideas will improve Outcome: Progressing

## 2022-07-29 NOTE — Progress Notes (Signed)
Pt stated she had ok day, pt stated she had issue sleeping last night, pt educated on coming to staff if she has issue sleeping tonight    07/29/22 2015  Psych Admission Type (Psych Patients Only)  Admission Status Voluntary  Psychosocial Assessment  Patient Complaints Anxiety  Eye Contact Fair  Facial Expression Sad  Affect Anxious;Depressed  Speech Soft  Interaction Assertive  Motor Activity Slow  Appearance/Hygiene Unremarkable  Behavior Characteristics Cooperative  Mood Depressed  Aggressive Behavior  Effect No apparent injury  Thought Process  Coherency WDL  Content WDL  Delusions WDL  Perception WDL  Hallucination None reported or observed  Judgment Impaired  Confusion WDL  Danger to Self  Current suicidal ideation? Denies  Self-Injurious Behavior Some self-injurious ideation observed or expressed.  No lethal plan expressed   Agreement Not to Harm Self Yes  Description of Agreement verbal contract for safety

## 2022-07-29 NOTE — [facility] Group Notes (Signed)
Patients were given education on signs and symptoms of behavioral dysregulation. Patients were then asked to identify using reflection signs they were not coping well, and identify healthy support systems.  Patient participated and attended group.  

## 2022-07-29 NOTE — [facility] Group Notes (Signed)
Adult Psychoeducational Group Note  Date:  07/29/2022 Time:  11:59 AM  Group Topic/Focus:  Goals Group:   The focus of this group is to help patients establish daily goals to achieve during treatment and discuss how the patient can incorporate goal setting into their daily lives to aide in recovery.  Participation Level:  Did Not Attend  Participation Quality:   Did not attend  Affect:   Did not attend  Cognitive:   Did  not attend  Insight: None  Engagement in Group:   Did not attend  Modes of Intervention:   Did not attend  Additional Comments:  Pt did not attend Goal group.  Harue Pribble, Georgiann Mccoy 07/29/2022, 11:59 AM

## 2022-07-30 DIAGNOSIS — F332 Major depressive disorder, recurrent severe without psychotic features: Secondary | ICD-10-CM | POA: Diagnosis not present

## 2022-07-30 LAB — T4, FREE: Free T4: 0.75 ng/dL (ref 0.61–1.12)

## 2022-07-30 MED ORDER — ARIPIPRAZOLE 5 MG PO TABS
5.0000 mg | ORAL_TABLET | Freq: Every day | ORAL | Status: DC
  Filled 2022-07-30: qty 1

## 2022-07-30 MED ORDER — TRAZODONE HCL 100 MG PO TABS
100.0000 mg | ORAL_TABLET | Freq: Every day | ORAL | Status: DC
  Administered 2022-07-30: 100 mg via ORAL
  Filled 2022-07-30 (×3): qty 1

## 2022-07-30 NOTE — Progress Notes (Signed)
Pt stated the Abilify made her sleepy. Pt would like to take it at night

## 2022-07-30 NOTE — Plan of Care (Signed)
  Problem: Coping: Goal: Ability to identify and develop effective coping behavior will improve Outcome: Progressing   Problem: Education: Goal: Emotional status will improve Outcome: Progressing Goal: Mental status will improve Outcome: Progressing   Problem: Activity: Goal: Interest or engagement in activities will improve Outcome: Progressing   Problem: Self-Concept: Goal: Level of anxiety will decrease Outcome: Not Progressing

## 2022-07-30 NOTE — Progress Notes (Signed)
D:  Patient's self inventory sheet, patient has fair sleep, sleep medication not helpful.  Fair appetite, normal energy level, good concentration.  Rated depression 6, hopeless 5, anxiety 4.  Denied withdrawals.  Denied SI.  Denied physical problems.  Denied physical pain.  No pain medicine.  Goal is come out today and interact more.  Plans to participate in groups.  No discharge plans. A:  Medications administered per MD orders.  Emotional support and encouragement given patient. R:  Denied SI and HI, contracts for safety.  Denied A/V hallucinations.  Safety maintained with 15 minute checks.  Patient stated she felt very tired and sleepy this morning.    Requested to take abilify at night  so she will not be sleepy during the day.

## 2022-07-30 NOTE — Progress Notes (Signed)
Adult Psychoeducational Group Note  Date:  07/30/2022 Time:  10:59 AM  Group Topic/Focus:  Orientation:   The focus of this group is to educate the patient on the purpose and policies of crisis stabilization and provide a format to answer questions about their admission.  The group details unit policies and expectations of patients while admitted.  Pt did not attend orientation group.

## 2022-07-30 NOTE — Progress Notes (Signed)
   07/30/22 0545  Sleep  Number of Hours 7.5

## 2022-07-30 NOTE — Progress Notes (Signed)
[facility] MD Progress Note  07/30/2022 12:28 PM Taylor Carson  MRN:  [id number] Subjective:    Taylor Carson is a 31 year old female with a past psychiatric history significant for major depressive disorder and anxiety who was admitted to [facility] from [facility] due to suicidal ideations in the presence of worsening depression.  Patient was assessed by Dominga Ferry for reevaluation. Patient is currently being managed on the following psychiatric medications:  Abilify 5 mg daily Trazodone 50 mg at bedtime as needed Hydroxyzine 25 mg 3 times daily as needed  Patient reports that she is irritated due to not getting any sleep.  She reports that her Abilify makes her drowsy but denies any other side effects from her medication.  Patient describes her mood as isolative stating that she does not want to be bothered by people.  She does report venturing outside of her room and attending group sessions.  She reports that the group sessions have been helpful getting her more aware of her mental health.  Patient still continues to endorse lingering depression.  She endorses anxiety and rates her anxiety at 5 out of 10 but denies any new stressors at this time.  Patient denies suicidal or homicidal ideations.  She further denies auditory or visual hallucinations and does not appear to be responding to internal/external stimuli.  Patient denies paranoia or delusional thoughts.  Patient endorses poor sleep stating that she receives on average 3 to 4 hours of sleep total.  Patient endorses fair appetite.  Patient discussed with provider about adjusting her sleep aid medications.  She also requested for her Abilify to be scheduled for the night instead of the morning.  Principal Problem: Bipolar 2 disorder, major depressive episode (HCC) Diagnosis: Principal Problem:   Bipolar 2 disorder, major depressive episode (HCC) Active Problems:    Hypothyroid   Severe episode of recurrent major depressive disorder, without psychotic features (HCC)   GAD (generalized anxiety disorder)  Total Time spent with patient: 15 minutes  Past Psychiatric History:  Patient reports a past psychiatric history significant for major depressive disorder and anxiety.  Patient reports that she was diagnosed with major depressive disorder back in 2016.  Past Medical History:  Past Medical History:  Diagnosis Date   Anxiety    Depression    Dyspnea    11-01-2018 per pt "due to my thyroid"   History of concussion    8/ 2018  per pt residual resolved   History of ectopic pregnancy 03/2017   treated medically   Hypothyroidism    Multinodular thyroid 05/2017   incidental finding on CT imaging     Past Surgical History:  Procedure Laterality Date   CESAREAN SECTION N/A 06/24/2018   Procedure: CESAREAN SECTION;  Surgeon: Adam Phenix, MD;  Location: [facility] BIRTHING SUIT[facility];  Service: Obstetrics;  Laterality: N/A;   THYROIDECTOMY N/A 11/08/2018   Procedure: TOTAL THYROIDECTOMY;  Surgeon: [name], MD;  Location: [facility] ORS;  Service: General;  Laterality: N/A;   Family History:  Family History  Problem Relation Age of Onset   Miscarriages / [place] Mother    Cancer Maternal Grandfather    Diabetes Paternal Grandmother    Heart disease Paternal Grandmother    Family Psychiatric  History:  Patient denies a family history of psychiatric illness and further denies anyone else in her family attempting suicide.  Social History:  Social History   Substance and Sexual Activity  Alcohol Use No  Social History   Substance and Sexual Activity  Drug Use Not Currently   Frequency: 1.0 times per week   Types: Marijuana, Cocaine   Comment: 11-01-2018  per pt last cocaine 2018, last Marijuana use 12/12/17    Social History   Socioeconomic History   Marital status: Single    Spouse name: Not on file   Number of children: Not on file   Years  of education: Not on file   Highest education level: Not on file  Occupational History   Not on file  Tobacco Use   Smoking status: Former    Packs/day: 0.25    Years: 4.00    Total pack years: 1.00    Types: Cigarettes    Quit date: 09/11/2017    Years since quitting: 4.8   Smokeless tobacco: Never  Vaping Use   Vaping Use: Never used  Substance and Sexual Activity   Alcohol use: No   Drug use: Not Currently    Frequency: 1.0 times per week    Types: Marijuana, Cocaine    Comment: 11-01-2018  per pt last cocaine 2018, last Marijuana use 12/12/17   Sexual activity: Yes    Partners: Male    Birth control/protection: Injection    Comment: Depo injection  Other Topics Concern   Not on file  Social History Narrative   Not on file   Social Determinants of Health   Financial Resource Strain: Low Risk  (06/20/2018)   Overall Financial Resource Strain (CARDIA)    Difficulty of Paying Living Expenses: Not hard at all  [place] Present (07/28/2022)   Hunger Vital Sign    Worried About Charity fundraiser in the Last Year: Often true    Ran Out of Food in the Last Year: Often true  Transportation Needs: Unmet Transportation Needs (07/28/2022)   PRAPARE - Hydrologist (Medical): No    Lack of Transportation (Non-Medical): Yes  Physical Activity: Sufficiently Active (06/20/2018)   Exercise Vital Sign    Days of Exercise per Week: 7 days    Minutes of Exercise per Session: 30 min  Stress: No Stress Concern Present (06/22/2018)   [employer] [place]    Feeling of Stress : Not at all  Social Connections: Not on file   Additional Social History:   Sleep: Poor  Appetite:  Good  Current Medications: Current Facility-Administered Medications  Medication Dose Route Frequency Provider Last Rate Last Admin   acetaminophen (TYLENOL) tablet 650 mg  650 mg Oral Q6H PRN Tharon Aquas, NP       alum & mag hydroxide-simeth (MAALOX/MYLANTA) 200-200-20 MG/5ML suspension 30 mL  30 mL Oral Q4H PRN Tharon Aquas, NP       Derrill Memo ON 07/31/2022] ARIPiprazole (ABILIFY) tablet 5 mg  5 mg Oral QHS Massengill, Nathan, MD       hydrOXYzine (ATARAX) tablet 25 mg  25 mg Oral TID PRN Tharon Aquas, NP   25 mg at 07/29/22 2130   levothyroxine (SYNTHROID) tablet 100 mcg  100 mcg Oral QAC breakfast Tharon Aquas, NP   100 mcg at 07/30/22 5681   magnesium hydroxide (MILK OF MAGNESIA) suspension 30 mL  30 mL Oral Daily PRN Tharon Aquas, NP       nicotine (NICODERM CQ - dosed in mg/24 hours) patch 21 mg  21 mg Transdermal Q0600 Tharon Aquas, NP  traZODone (DESYREL) tablet 100 mg  100 mg Oral QHS Massengill, Nathan, MD       traZODone (DESYREL) tablet 50 mg  50 mg Oral QHS PRN Lauree Chandler, NP   50 mg at 07/29/22 2130    Lab Results:  Results for orders placed or performed during the hospital encounter of 07/28/22 (from the past 48 hour(s))  Resp Panel by RT-PCR (Flu A&B, Covid) Anterior Nasal Swab     Status: None   Collection Time: 07/28/22  1:04 PM   Specimen: Anterior Nasal Swab  Result Value Ref Range   SARS Coronavirus 2 by RT PCR NEGATIVE NEGATIVE    Comment: (NOTE) SARS-CoV-2 target nucleic acids are NOT DETECTED.  The SARS-CoV-2 RNA is generally detectable in upper respiratory specimens during the acute phase of infection. The lowest concentration of SARS-CoV-2 viral copies this assay can detect is 138 copies/mL. A negative result does not preclude SARS-Cov-2 infection and should not be used as the sole basis for treatment or other patient management decisions. A negative result may occur with  improper specimen collection/handling, submission of specimen other than nasopharyngeal swab, presence of viral mutation(s) within the areas targeted by this assay, and inadequate number of viral copies(<138 copies/mL). A negative  result must be combined with clinical observations, patient history, and epidemiological information. The expected result is Negative.  Fact Sheet for Patients:  BloggerCourse.com  Fact Sheet for Healthcare Providers:  SeriousBroker.it  This test is no t yet approved or cleared by the [place] FDA and  has been authorized for detection and/or diagnosis of SARS-CoV-2 by FDA under an Emergency Use Authorization (EUA). This EUA will remain  in effect (meaning this test can be used) for the duration of the COVID-19 declaration under Section 564(b)(1) of the Act, 21 U.S.C.section 360bbb-3(b)(1), unless the authorization is terminated  or revoked sooner.       Influenza A by PCR NEGATIVE NEGATIVE   Influenza B by PCR NEGATIVE NEGATIVE    Comment: (NOTE) The Xpert Xpress SARS-CoV-2/FLU/RSV plus assay is intended as an aid in the diagnosis of influenza from Nasopharyngeal swab specimens and should not be used as a sole basis for treatment. Nasal washings and aspirates are unacceptable for Xpert Xpress SARS-CoV-2/FLU/RSV testing.  Fact Sheet for Patients: BloggerCourse.com  Fact Sheet for Healthcare Providers: SeriousBroker.it  This test is not yet approved or cleared by the [place] FDA and has been authorized for detection and/or diagnosis of SARS-CoV-2 by FDA under an Emergency Use Authorization (EUA). This EUA will remain in effect (meaning this test can be used) for the duration of the COVID-19 declaration under Section 564(b)(1) of the Act, 21 U.S.C. section 360bbb-3(b)(1), unless the authorization is terminated or revoked.  Performed at [facility], [address] [place], [place] [address]   POCT Urine Drug Screen - (I-Screen)     Status: Abnormal   Collection Time: 07/28/22  1:05 PM  Result Value Ref Range   POC Amphetamine UR      Comment: Negative    POC Secobarbital (BAR)      Comment: Negative   POC Buprenorphine (BUP)      Comment: Negative   POC Oxazepam (BZO)      Comment: Negative   POC Cocaine UR      Comment: Positive   POC Methamphetamine UR      Comment: Negative   POC Morphine      Comment: Negative   POC Methadone UR  Comment: Negative   POC Oxycodone UR      Comment: Negative   POC Marijuana UR      Comment: Positive  Ethanol     Status: None   Collection Time: 07/28/22  2:17 PM  Result Value Ref Range   Alcohol, Ethyl (B) <10 <10 mg/dL    Comment: (NOTE) Lowest detectable limit for serum alcohol is 10 mg/dL.  For medical purposes only. Performed at [facility], [address] [place], [place] [address]   CBC with Differential/Platelet     Status: None   Collection Time: 07/28/22  2:18 PM  Result Value Ref Range   WBC 6.3 4.0 - 10.5 K/uL   RBC 4.85 3.87 - 5.11 MIL/uL   Hemoglobin 14.4 12.0 - 15.0 g/dL   HCT 60.443.1 54.036.0 - 98.146.0 %   MCV 88.9 80.0 - 100.0 fL   MCH 29.7 26.0 - 34.0 pg   MCHC 33.4 30.0 - 36.0 g/dL   RDW 19.112.6 47.811.5 - 29.515.5 %   Platelets 295 150 - 400 K/uL   nRBC 0.0 0.0 - 0.2 %   Neutrophils Relative % 62 %   Neutro Abs 3.9 1.7 - 7.7 K/uL   Lymphocytes Relative 31 %   Lymphs Abs 2.0 0.7 - 4.0 K/uL   Monocytes Relative 5 %   Monocytes Absolute 0.3 0.1 - 1.0 K/uL   Eosinophils Relative 2 %   Eosinophils Absolute 0.1 0.0 - 0.5 K/uL   Basophils Relative 0 %   Basophils Absolute 0.0 0.0 - 0.1 K/uL   Immature Granulocytes 0 %   Abs Immature Granulocytes 0.01 0.00 - 0.07 K/uL    Comment: Performed at [facility], [address] [place], [place] [address]  Comprehensive metabolic panel     Status: Abnormal   Collection Time: 07/28/22  2:18 PM  Result Value Ref Range   Sodium 139 135 - 145 mmol/L   Potassium 3.9 3.5 - 5.1 mmol/L   Chloride 107 98 - 111 mmol/L   CO2 24 22 - 32 mmol/L   Glucose, Bld 86 70 - 99 mg/dL    Comment: Glucose reference range applies only to  samples taken after fasting for at least 8 hours.   BUN 6 6 - 20 mg/dL   Creatinine, Ser 8.650.95 0.44 - 1.00 mg/dL   Calcium 9.2 8.9 - 78.410.3 mg/dL   Total Protein 7.6 6.5 - 8.1 g/dL   Albumin 3.8 3.5 - 5.0 g/dL   AST 16 15 - 41 U/L   ALT 12 0 - 44 U/L   Alkaline Phosphatase 57 38 - 126 U/L   Total Bilirubin <0.1 (L) 0.3 - 1.2 mg/dL   GFR, Estimated >69>60 >62>60 mL/min    Comment: (NOTE) Calculated using the CKD-EPI Creatinine Equation (2021)    Anion gap 8 5 - 15    Comment: Performed at [facility], [address] [place], [place] [address]  Lipid panel     Status: Abnormal   Collection Time: 07/28/22  2:18 PM  Result Value Ref Range   Cholesterol 181 0 - 200 mg/dL   Triglycerides 43 <132<150 mg/dL   HDL 53 >44>40 mg/dL   Total CHOL/HDL Ratio 3.4 RATIO   VLDL 9 0 - 40 mg/dL   LDL Cholesterol 010119 (H) 0 - 99 mg/dL    Comment:        Total Cholesterol/HDL:CHD Risk Coronary Heart Disease Risk Table  Men   Women  1/2 Average Risk   3.4   3.3  Average Risk       5.0   4.4  2 X Average Risk   9.6   7.1  3 X Average Risk  23.4   11.0        Use the calculated Patient Ratio above and the CHD Risk Table to determine the patient's CHD Risk.        ATP III CLASSIFICATION (LDL):  <100     mg/dL   Optimal  660-630  mg/dL   Near or Above                    Optimal  130-159  mg/dL   Borderline  160-109  mg/dL   High  >323     mg/dL   Very High Performed at [facility], [address], [place], [place] [address]   TSH     Status: Abnormal   Collection Time: 07/28/22  2:18 PM  Result Value Ref Range   TSH 6.596 (H) 0.350 - 4.500 uIU/mL    Comment: Performed by a 3rd Generation assay with a functional sensitivity of <=0.01 uIU/mL. Performed at [facility], [address], [place], [place] [address]   Hemoglobin A1c     Status: None   Collection Time: 07/28/22  2:20 PM  Result Value Ref Range   Hgb A1c MFr Bld 5.4 4.8 - 5.6 %    Comment: (NOTE) Pre  diabetes:          5.7%-6.4%  Diabetes:              >6.4%  Glycemic control for   <7.0% adults with diabetes    Mean Plasma Glucose 108.28 mg/dL    Comment: Performed at [facility], [address] [place], [place] [address]    Blood Alcohol level:  Lab Results  Component Value Date   [facility] <10 07/28/2022   ETH <11 05/30/2011    Metabolic Disorder Labs: Lab Results  Component Value Date   HGBA1C 5.4 07/28/2022   MPG 108.28 07/28/2022   No results found for: "PROLACTIN" Lab Results  Component Value Date   CHOL 181 07/28/2022   TRIG 43 07/28/2022   HDL 53 07/28/2022   CHOLHDL 3.4 07/28/2022   VLDL 9 07/28/2022   LDLCALC 119 (H) 07/28/2022    Physical Findings: AIMS: Facial and Oral Movements Muscles of Facial Expression: None, normal Lips and Perioral Area: None, normal Jaw: None, normal Tongue: None, normal,Extremity Movements Upper (arms, wrists, hands, fingers): None, normal Lower (legs, knees, ankles, toes): None, normal, Trunk Movements Neck, shoulders, hips: None, normal, Overall Severity Severity of abnormal movements (highest score from questions above): None, normal Incapacitation due to abnormal movements: None, normal Patient's awareness of abnormal movements (rate only patient's report): No Awareness, Dental Status Current problems with teeth and/or dentures?: No Does patient usually wear dentures?: No  CIWA:    COWS:     Musculoskeletal: Strength & Muscle Tone: within normal limits Gait & Station: normal Patient leans: N/A  Psychiatric Specialty Exam:  Presentation  General Appearance:  Appropriate for Environment; Casual  Eye Contact: Fair  Speech: Clear and Coherent; Normal Rate  Speech Volume: Normal  Handedness: Right   Mood and Affect  Mood: Depressed; Anxious  Affect: Congruent   Thought Process  Thought Processes: Coherent; Goal Directed  Descriptions of Associations:Intact  Orientation:Full (Time,  Place and Person)  Thought Content:WDL  History of Schizophrenia/Schizoaffective disorder:No  Duration of Psychotic Symptoms:No data recorded Hallucinations:Hallucinations: None  Ideas of Reference:None  Suicidal Thoughts:Suicidal Thoughts: No  Homicidal Thoughts:Homicidal Thoughts: No   Sensorium  Memory: Immediate Good; Recent Good; Remote Good  Judgment: Intact  Insight: Present   Executive Functions  Concentration: Good  Attention Span: Good  Recall: Fair  Fund of Knowledge: Good  Language: Good   Psychomotor Activity  Psychomotor Activity: Psychomotor Activity: Normal   Assets  Assets: Communication Skills; Desire for Improvement; Social Support; Vocational/Educational   Sleep  Sleep: Sleep: Poor    Physical Exam: Physical Exam Constitutional:      Appearance: Normal appearance.  HENT:     Head: Normocephalic and atraumatic.     Nose: Nose normal.     Mouth/Throat:     Mouth: Mucous membranes are moist.  Eyes:     Extraocular Movements: Extraocular movements intact.     Pupils: Pupils are equal, round, and reactive to light.  Cardiovascular:     Rate and Rhythm: Normal rate and regular rhythm.  Pulmonary:     Effort: Pulmonary effort is normal.     Breath sounds: Normal breath sounds.  Abdominal:     General: Abdomen is flat.  Musculoskeletal:        General: Normal range of motion.     Cervical back: Normal range of motion and neck supple.  Skin:    General: Skin is warm and dry.  Neurological:     General: No focal deficit present.     Mental Status: She is alert and oriented to person, place, and time.  Psychiatric:        Attention and Perception: Attention and perception normal. She does not perceive auditory or visual hallucinations.        Mood and Affect: Affect normal. Mood is anxious and depressed.        Speech: Speech normal.        Behavior: Behavior normal. Behavior is cooperative.        Thought Content:  Thought content normal. Thought content is not paranoid or delusional. Thought content does not include homicidal or suicidal ideation.        Cognition and Memory: Cognition and memory normal.        Judgment: Judgment normal.    Review of Systems  Constitutional: Negative.   HENT: Negative.    Eyes: Negative.   Respiratory: Negative.    Cardiovascular: Negative.   Gastrointestinal: Negative.   Skin: Negative.   Neurological: Negative.   Psychiatric/Behavioral:  Positive for depression. Negative for hallucinations, substance abuse and suicidal ideas. The patient is nervous/anxious and has insomnia.    Blood pressure 107/76, pulse 87, temperature 98.6 F (37 C), temperature source Oral, resp. rate 18, height  (1.626 m), weight 97.1 kg, SpO2 99 %, currently breastfeeding. Body mass index is 36.73 kg/m.   Treatment Plan Summary:  Daily contact with patient to assess and evaluate symptoms and progress in treatment and Medication management  Plan:   Safety and Monitoring: Voluntary admission to inpatient psychiatric unit for safety, stabilization and treatment Daily contact with patient to assess and evaluate symptoms and progress in treatment Patient's case to be discussed in multi-disciplinary team meeting Observation Level : q15 minute checks Vital signs: q12 hours Precautions: suicide, elopement, and assault    Diagnosis:  Principal Problem:   MDD (major depressive disorder), recurrent severe, without psychosis (HCC)   #Major depressive disorder, recurrent severe, without psychosis -Continue  Abilify 5 mg at bedtime for depression and mood stability   #Insomnia -Start trazodone 100 mg at bedtime for insomnia  As needed medications: -Patient to start taking Tylenol 650 mg every 6 hours as needed for mild pain -Patient to start taking Maalox/Mylanta 30 mL every 4 hours as needed for indigestion -Patient to start taking Milk of Magnesia 30 mL as needed for mild  constipation -Patient to start hydroxyzine 25 mg 3 times daily as needed for anxiety -Patient to start trazodone 50 mg at bedtime as needed for insomnia    #Hypothyroidism -Continue levothyroxine 100 mcg daily before breakfast -TSH: 6.596 ulU/mL -Labs to be ordered: T3, free/T4, free   #Tobacco dependence -Continue NicoDerm CQ 21 mg / 24-hour patch    Discharge Planning: Social work and case management to assist with discharge planning and identification of hospital follow-up needs prior to discharge Estimated LOS: 5-7 days Discharge Concerns: Need to establish a safety plan; Medication compliance and effectiveness Discharge Goals: Return home with outpatient referrals for mental health follow-up including medication management/psychotherapy    I certify that inpatient services furnished can reasonably be expected to improve the patient's condition.    Meta Hatchet, PA 07/30/2022, 12:28 PM

## 2022-07-30 NOTE — Plan of Care (Signed)
Nurse discussed coping skills with patient.  

## 2022-07-30 NOTE — Progress Notes (Signed)
[facility] Notes:  (Nursing/MHT/Case Management/Adjunct)  Date:  07/30/2022  Time:  2000 Type of Therapy:   wrap up group  Participation Level:  Active  Participation Quality:  Appropriate, Attentive, Sharing, and Supportive  Affect:  Anxious and Appropriate  Cognitive:  Alert  Insight:  Improving  Engagement in Group:  Engaged  Modes of Intervention:  Clarification, Education, and Support  Summary of Progress/Problems: Positive thinking and positive change were discussed.   Shellia Cleverly 07/30/2022, 8:45 PM

## 2022-07-30 NOTE — Progress Notes (Signed)
D- Patient alert and oriented. Denies SI, HI, AVH, and pain. Patient rated anxiety at an 8/10 and depression as a 5/10. Patient states that she had a "good day" and enjoyed playing basketball during recreation time.  A- Scheduled medications administered to patient, per MD orders. Support and encouragement provided.  Routine safety checks conducted every 15 minutes.  Patient informed to notify staff with problems or concerns.  R-No adverse drug reactions noted. Patient contracts for safety at this time. Patient compliant with medications and treatment plan. Patient receptive, calm, and cooperative. Patient interacts well with others on the unit.  Patient remains safe at this time.

## 2022-07-31 DIAGNOSIS — F332 Major depressive disorder, recurrent severe without psychotic features: Secondary | ICD-10-CM

## 2022-07-31 LAB — T3, FREE: T3, Free: 2.2 pg/mL (ref 2.0–4.4)

## 2022-07-31 MED ORDER — MELATONIN 5 MG PO TABS
5.0000 mg | ORAL_TABLET | Freq: Every day | ORAL | Status: DC
  Administered 2022-07-31: 5 mg via ORAL
  Filled 2022-07-31 (×3): qty 1

## 2022-07-31 MED ORDER — ARIPIPRAZOLE 15 MG PO TABS
7.5000 mg | ORAL_TABLET | Freq: Every day | ORAL | Status: DC
  Administered 2022-07-31: 7.5 mg via ORAL
  Filled 2022-07-31 (×3): qty 1

## 2022-07-31 NOTE — Group Note (Signed)
[facility] LCSW Group Therapy Note    Group Date: 07/31/2022 Start Time: 1300 End Time: 1400  Type of Therapy and Topic:  Group Therapy:  Overcoming Obstacles  Participation Level:  [facility] PARTICIPATION LEVEL: Active  Mood: Pleasant   Description of Group:   In this group patients will be encouraged to explore what they see as obstacles to their own wellness and recovery. They will be guided to discuss their thoughts, feelings, and behaviors related to these obstacles. The group will process together ways to cope with barriers, with attention given to specific choices patients can make. Each patient will be challenged to identify changes they are motivated to make in order to overcome their obstacles. This group will be process-oriented, with patients participating in exploration of their own experiences as well as giving and receiving support and challenge from other group members.  Therapeutic Goals: 1. Patient will identify personal and current obstacles as they relate to admission. 2. Patient will identify barriers that currently interfere with their wellness or overcoming obstacles.  3. Patient will identify feelings, thought process and behaviors related to these barriers. 4. Patient will identify two changes they are willing to make to overcome these obstacles:    Summary of Patient Progress Patient was present for the entirety of the group session. Patient was an active listener and participated in the topic of discussion, provided helpful advice to others, and added nuance to topic of conversation. Patient states she has a fear of spiders and avoids them if at all possible. Patients were encouraged to compare examples to avoidance behaviors and challenge themselves to address avoidance.    Therapeutic Modalities:   Cognitive Behavioral Therapy Solution Focused Therapy Motivational Interviewing Relapse Prevention Therapy   Durenda Hurt, [place]

## 2022-07-31 NOTE — Progress Notes (Signed)
Adult Psychoeducational Group Note  Date:  07/31/2022 Time:  9:13 PM  Group Topic/Focus:  Wrap-Up Group:   The focus of this group is to help patients review their daily goal of treatment and discuss progress on daily workbooks.  Participation Level:  Active  Participation Quality:  Appropriate  Affect:  Appropriate  Cognitive:  Appropriate  Insight: Appropriate  Engagement in Group:  Engaged  Modes of Intervention:  Education and Exploration  Additional Comments:  Patient attended and participated in group tonight. She reports that while she has been here she learn fight, flight freeze and fawuning. Words that she can take with her.  Taylor Carson [facility] 07/31/2022, 9:13 PM

## 2022-07-31 NOTE — Progress Notes (Signed)
D:  Patient's self inventory sheet, patient sleeps good, no sleep medication given.  Fair appetite, normal energy level, good concentration.  Rated depression 4, hopeless 3, anxiety 5.  Denied withdrawals.  Denied SI.  Denied physical problems.  Denied physical pain.   A:  Medications administered per MD orders. Emotional support and encouragement given patient. R:  Denied SI and HI, contracts for safety.  Denied A/V hallucinations.  Safety maintained with 15 minute checks. Marland Kitchen

## 2022-07-31 NOTE — Progress Notes (Signed)
   07/31/22 2000  Psych Admission Type (Psych Patients Only)  Admission Status Voluntary  Psychosocial Assessment  Patient Complaints Anxiety;Depression  Eye Contact Fair  Facial Expression Sad  Affect Anxious;Depressed  Speech Soft  Interaction Assertive  Motor Activity Slow  Appearance/Hygiene Unremarkable  Behavior Characteristics Cooperative  Mood Anxious;Depressed  Aggressive Behavior  Effect No apparent injury  Thought Process  Coherency WDL  Content WDL  Delusions WDL  Perception WDL  Hallucination None reported or observed  Judgment Impaired  Confusion WDL  Danger to Self  Current suicidal ideation? Denies  Self-Injurious Behavior Some self-injurious ideation observed or expressed.  No lethal plan expressed   Agreement Not to Harm Self Yes  Description of Agreement verbal contract for safety

## 2022-07-31 NOTE — Group Note (Signed)
Date:  07/31/2022 Time:  3:14 PM  Group Topic/Focus:  Emotional Education:   The focus of this group is to discuss what feelings/emotions are, and how they are experienced.    Participation Level:  Active  Participation Quality:  Appropriate  Affect:  Appropriate  Cognitive:  Appropriate  Insight: Appropriate  Engagement in Group:  Engaged  Modes of Intervention:  Education  Additional Comments:  Educated client in ways to use thought stopping to allow herself to remain calm when feeling anxious and overwhelmed.   Jerrye Beavers 07/31/2022, 3:14 PM

## 2022-07-31 NOTE — Plan of Care (Signed)
Nurse discussed anxiety, depression and coping skills with patient.  

## 2022-07-31 NOTE — Group Note (Signed)
[facility] LCSW Group Therapy Note   Group Date: 07/31/2022 Start Time: 1300 End Time: 1400   Type of Therapy and Topic: Group Therapy: Avoiding Self-Sabotaging and Enabling Behaviors  Participation Level: Active  Mood:  Description of Group:  In this group, patients will learn how to identify obstacles, self-sabotaging and enabling behaviors, as well as: what are they, why do we do them and what needs these behaviors meet. Discuss unhealthy relationships and how to have positive healthy boundaries with those that sabotage and enable. Explore aspects of self-sabotage and enabling in yourself and how to limit these self-destructive behaviors in everyday life.   Therapeutic Goals: 1. Patient will identify one obstacle that relates to self-sabotage and enabling behaviors 2. Patient will identify one personal self-sabotaging or enabling behavior they did prior to admission 3. Patient will state a plan to change the above identified behavior 4. Patient will demonstrate ability to communicate their needs through discussion and/or role play.    Summary of Patient Progress:  Therapeutic Modalities:  Cognitive Behavioral Therapy Person-Centered Therapy Motivational Interviewing    Elyce Zollinger S Laiklyn Pilkenton, LCSW 

## 2022-07-31 NOTE — Progress Notes (Signed)
   07/31/22 0500  Sleep  Number of Hours 6.25

## 2022-07-31 NOTE — Progress Notes (Signed)
[facility] MD Progress Note  07/31/2022 4:12 PM Taylor Carson  MRN:  [id number] Subjective:    Taylor Carson is a 31 year old female with a past psychiatric history significant for major depressive disorder and anxiety who was admitted to [facility] from [facility] due to suicidal ideations in the presence of worsening depression.   Patient was assessed by Dominga Ferry for reevaluation. Patient is currently being managed on the following psychiatric medications:   Abilify 5 mg daily Trazodone 100 mg at bedtime as well as an additional 50 mg dose as needed Hydroxyzine 25 mg 3 times daily as needed  Patient reports that she is "pissed off" from her trazodone prescription.  She reports that medication does not help her with her sleep and that she has only been able to get a couple of hours of sleep at a time.  Patient reports that just when she is about to fall asleep, the hospital staff is calling her name to wake up.  Patient describes her mood as upset and restless.  She denies depression and further denies anxiety.  Per nursing report, patient expressed depression as well as anxiety she rated a 5 out of 10.  Patient denies suicidal or homicidal ideations.  She further denies auditory or visual hallucinations and does not appear to be responding to internal/external stimuli.  Patient denies paranoia and further denies delusional thoughts.  Patient endorses poor sleep and states that she has no appetite.  Patient reports that she has been attending group sessions.  Principal Problem: Bipolar 2 disorder, major depressive episode (HCC) Diagnosis: Principal Problem:   Bipolar 2 disorder, major depressive episode (HCC) Active Problems:   Hypothyroid   Severe episode of recurrent major depressive disorder, without psychotic features (HCC)   GAD (generalized anxiety disorder)  Total Time spent with patient: 15 minutes  Past Psychiatric  History:  Patient reports a past psychiatric history significant for major depressive disorder and anxiety.  Patient reports that she was diagnosed with major depressive disorder back in 2016.  Past Medical History:  Past Medical History:  Diagnosis Date   Anxiety    Depression    Dyspnea    11-01-2018 per pt "due to my thyroid"   History of concussion    8/ 2018  per pt residual resolved   History of ectopic pregnancy 03/2017   treated medically   Hypothyroidism    Multinodular thyroid 05/2017   incidental finding on CT imaging     Past Surgical History:  Procedure Laterality Date   CESAREAN SECTION N/A 06/24/2018   Procedure: CESAREAN SECTION;  Surgeon: Adam Phenix, MD;  Location: [facility];  Service: Obstetrics;  Laterality: N/A;   THYROIDECTOMY N/A 11/08/2018   Procedure: TOTAL THYROIDECTOMY;  Surgeon: [name], MD;  Location: [facility];  Service: General;  Laterality: N/A;   Family History:  Family History  Problem Relation Age of Onset   Miscarriages / [place] Mother    Cancer Maternal Grandfather    Diabetes Paternal Grandmother    Heart disease Paternal Grandmother    Family Psychiatric  History:  Patient denies a family history of psychiatric illness and further denies anyone else in her family attempting suicide.  Social History:  Social History   Substance and Sexual Activity  Alcohol Use No     Social History   Substance and Sexual Activity  Drug Use Not Currently   Frequency: 1.0 times per week   Types: Marijuana,  Cocaine   Comment: 11-01-2018  per pt last cocaine 2018, last Marijuana use 12/12/17    Social History   Socioeconomic History   Marital status: Single    Spouse name: Not on file   Number of children: Not on file   Years of education: Not on file   Highest education level: Not on file  Occupational History   Not on file  Tobacco Use   Smoking status: Former    Packs/day: 0.25    Years: 4.00    Total pack years: 1.00     Types: Cigarettes    Quit date: 09/11/2017    Years since quitting: 4.8   Smokeless tobacco: Never  Vaping Use   Vaping Use: Never used  Substance and Sexual Activity   Alcohol use: No   Drug use: Not Currently    Frequency: 1.0 times per week    Types: Marijuana, Cocaine    Comment: 11-01-2018  per pt last cocaine 2018, last Marijuana use 12/12/17   Sexual activity: Yes    Partners: Male    Birth control/protection: Injection    Comment: Depo injection  Other Topics Concern   Not on file  Social History Narrative   Not on file   Social Determinants of Health   Financial Resource Strain: Low Risk  (06/20/2018)   Overall Financial Resource Strain (CARDIA)    Difficulty of Paying Living Expenses: Not hard at all  [place] Present (07/28/2022)   Hunger Vital Sign    Worried About Charity fundraiser in the Last Year: Often true    Ran Out of Food in the Last Year: Often true  Transportation Needs: Unmet Transportation Needs (07/28/2022)   PRAPARE - Hydrologist (Medical): No    Lack of Transportation (Non-Medical): Yes  Physical Activity: Sufficiently Active (06/20/2018)   Exercise Vital Sign    Days of Exercise per Week: 7 days    Minutes of Exercise per Session: 30 min  Stress: No Stress Concern Present (06/22/2018)   [employer] [place]    Feeling of Stress : Not at all  Social Connections: Not on file   Additional Social History:   Sleep: Poor  Appetite:  Poor  Current Medications: Current Facility-Administered Medications  Medication Dose Route Frequency Provider Last Rate Last Admin   acetaminophen (TYLENOL) tablet 650 mg  650 mg Oral Q6H PRN Tharon Aquas, NP       alum & mag hydroxide-simeth (MAALOX/MYLANTA) 200-200-20 MG/5ML suspension 30 mL  30 mL Oral Q4H PRN Tharon Aquas, NP       ARIPiprazole (ABILIFY) tablet 7.5 mg  7.5 mg Oral QHS  Massengill, Nathan, MD       hydrOXYzine (ATARAX) tablet 25 mg  25 mg Oral TID PRN Tharon Aquas, NP   25 mg at 07/30/22 2125   levothyroxine (SYNTHROID) tablet 100 mcg  100 mcg Oral QAC breakfast Tharon Aquas, NP   100 mcg at 07/31/22 0630   magnesium hydroxide (MILK OF MAGNESIA) suspension 30 mL  30 mL Oral Daily PRN Tharon Aquas, NP       melatonin tablet 5 mg  5 mg Oral QHS Massengill, Nathan, MD       nicotine (NICODERM CQ - dosed in mg/24 hours) patch 21 mg  21 mg Transdermal Q0600 Tharon Aquas, NP        Lab Results:  Results for orders  placed or performed during the hospital encounter of 07/28/22 (from the past 48 hour(s))  T3, free     Status: None   Collection Time: 07/30/22  6:15 PM  Result Value Ref Range   T3, Free 2.2 2.0 - 4.4 pg/mL    Comment: (NOTE) Performed At: [facility] [address] [place], [place] [id number] Jolene Schimke MD [id number]   T4, free     Status: None   Collection Time: 07/30/22  6:15 PM  Result Value Ref Range   Free T4 0.75 0.61 - 1.12 ng/dL    Comment: (NOTE) Biotin ingestion may interfere with free T4 tests. If the results are inconsistent with the TSH level, previous test results, or the clinical presentation, then consider biotin interference. If needed, order repeat testing after stopping biotin. Performed at [facility], [address] [place], [place] [address]     Blood Alcohol level:  Lab Results  Component Value Date   [facility] <10 07/28/2022   ETH <11 05/30/2011    Metabolic Disorder Labs: Lab Results  Component Value Date   HGBA1C 5.4 07/28/2022   MPG 108.28 07/28/2022   No results found for: "PROLACTIN" Lab Results  Component Value Date   CHOL 181 07/28/2022   TRIG 43 07/28/2022   HDL 53 07/28/2022   CHOLHDL 3.4 07/28/2022   VLDL 9 07/28/2022   LDLCALC 119 (H) 07/28/2022    Physical Findings: AIMS: Facial and Oral Movements Muscles of Facial Expression: None,  normal Lips and Perioral Area: None, normal Jaw: None, normal Tongue: None, normal,Extremity Movements Upper (arms, wrists, hands, fingers): None, normal Lower (legs, knees, ankles, toes): None, normal, Trunk Movements Neck, shoulders, hips: None, normal, Overall Severity Severity of abnormal movements (highest score from questions above): None, normal Incapacitation due to abnormal movements: None, normal Patient's awareness of abnormal movements (rate only patient's report): No Awareness, Dental Status Current problems with teeth and/or dentures?: No Does patient usually wear dentures?: No  CIWA:    COWS:     Musculoskeletal: Strength & Muscle Tone: within normal limits Gait & Station: normal Patient leans: N/A  Psychiatric Specialty Exam:  Presentation  General Appearance:  Appropriate for Environment; Casual  Eye Contact: Fair  Speech: Clear and Coherent; Normal Rate  Speech Volume: Normal  Handedness: Right   Mood and Affect  Mood: Depressed  Affect: Congruent   Thought Process  Thought Processes: Coherent; Goal Directed  Descriptions of Associations:Intact  Orientation:Full (Time, Place and Person)  Thought Content:WDL  History of Schizophrenia/Schizoaffective disorder:No  Duration of Psychotic Symptoms:No data recorded Hallucinations:Hallucinations: None  Ideas of Reference:None  Suicidal Thoughts:Suicidal Thoughts: No  Homicidal Thoughts:Homicidal Thoughts: No   Sensorium  Memory: Immediate Good; Recent Good; Remote Good  Judgment: Intact  Insight: Present   Executive Functions  Concentration: Good  Attention Span: Good  Recall: Fair  Fund of Knowledge: Good  Language: Good   Psychomotor Activity  Psychomotor Activity: Psychomotor Activity: Normal   Assets  Assets: Communication Skills; Desire for Improvement; Social Support; Vocational/Educational   Sleep  Sleep: Sleep: Poor    Physical  Exam: Physical Exam Constitutional:      Appearance: Normal appearance.  HENT:     Head: Normocephalic and atraumatic.     Nose: Nose normal.     Mouth/Throat:     Mouth: Mucous membranes are moist.  Eyes:     Extraocular Movements: Extraocular movements intact.     Pupils: Pupils are equal, round, and reactive to light.  Cardiovascular:  Rate and Rhythm: Normal rate and regular rhythm.  Pulmonary:     Effort: Pulmonary effort is normal.     Breath sounds: Normal breath sounds.  Abdominal:     General: Abdomen is flat.  Musculoskeletal:        General: Normal range of motion.     Cervical back: Normal range of motion and neck supple.  Skin:    General: Skin is warm and dry.  Neurological:     General: No focal deficit present.     Mental Status: She is alert and oriented to person, place, and time.  Psychiatric:        Attention and Perception: Attention and perception normal. She does not perceive auditory or visual hallucinations.        Mood and Affect: Mood is depressed. Affect is blunt.        Speech: Speech normal.        Behavior: Behavior normal. Behavior is cooperative.        Thought Content: Thought content is not paranoid or delusional. Thought content does not include homicidal or suicidal ideation.        Cognition and Memory: Cognition and memory normal.        Judgment: Judgment normal.    Review of Systems  Constitutional: Negative.   HENT: Negative.    Eyes: Negative.   Respiratory: Negative.    Cardiovascular: Negative.   Genitourinary: Negative.   Skin: Negative.   Neurological: Negative.   Psychiatric/Behavioral:  Positive for depression. Negative for hallucinations, substance abuse and suicidal ideas. The patient has insomnia. The patient is not nervous/anxious.    Blood pressure 113/84, pulse 87, temperature 97.9 F (36.6 C), temperature source Oral, resp. rate 18, height 5\' 4"  (1.626 m), weight 97.1 kg, SpO2 100 %, currently breastfeeding.  Body mass index is 36.73 kg/m.   Treatment Plan Summary:  Daily contact with patient to assess and evaluate symptoms and progress in treatment and Medication management  Safety and Monitoring: Voluntary admission to inpatient psychiatric unit for safety, stabilization and treatment Daily contact with patient to assess and evaluate symptoms and progress in treatment Patient's case to be discussed in multi-disciplinary team meeting Observation Level : q15 minute checks Vital signs: q12 hours Precautions: safety   Diagnosis: Principal Problem:   Bipolar 2 disorder, major depressive episode (HCC) Active Problems:   Hypothyroid   Severe episode of recurrent major depressive disorder, without psychotic features (HCC)   GAD (generalized anxiety disorder)   #Bipolar 2 disorder, major depressive episode (HCC) -Increase Abilify to 7.5 mg at bedtime for depression and mood stability   #Insomnia -Stop trazodone 100 mg at bedtime for insomnia -Start melatonin 5 mg at bedtime for insomnia   #Generalized anxiety disorder -Continue hydroxyzine 25 mg 3 times daily as needed  As needed medications: -Patient to start taking Tylenol 650 mg every 6 hours as needed for mild pain -Patient to start taking Maalox/Mylanta 30 mL every 4 hours as needed for indigestion -Patient to start taking Milk of Magnesia 30 mL as needed for mild constipation -Patient to start hydroxyzine 25 mg 3 times daily as needed for anxiety -Patient to start trazodone 50 mg at bedtime as needed for insomnia    #Hypothyroidism -Continue levothyroxine 100 mcg daily before breakfast -TSH: 6.596 ulU/mL -T4: 0.75 ng/dL   #Tobacco dependence -Continue NicoDerm CQ 21 mg / 24-hour patch     Discharge Planning: Social work and case management to assist with discharge planning and identification of  hospital follow-up needs prior to discharge Estimated LOS: 5-7 days Discharge Concerns: Need to establish a safety plan;  Medication compliance and effectiveness Discharge Goals: Return home with outpatient referrals for mental health follow-up including medication management/psychotherapy    I certify that inpatient services furnished can reasonably be expected to improve the patient's condition.   Meta Hatchet, PA 07/31/2022, 4:12 PM

## 2022-07-31 NOTE — Group Note (Signed)
Date:  07/31/2022 Time:  2:20 PM  Group Topic/Focus:  Orientation:   The focus of this group is to educate the patient on the purpose and policies of crisis stabilization and provide a format to answer questions about their admission.  The group details unit policies and expectations of patients while admitted.    Participation Level:  Active  Participation Quality:  Appropriate  Affect:  Appropriate  Cognitive:  Appropriate  Insight: Appropriate  Engagement in Group:  Engaged  Modes of Intervention:  Discussion  Additional Comments:     Jerrye Beavers 07/31/2022, 2:20 PM

## 2022-07-31 NOTE — Progress Notes (Signed)
[facility] MD Progress Note  07/31/2022 3:34 PM Taylor Carson  MRN:  [id number] Subjective:    Taylor Carson is a 31 year old female with a past psychiatric history significant for major depressive disorder and anxiety who was admitted to [facility] from [facility] due to suicidal ideations in the presence of worsening depression.   Patient was assessed by Hilaria Ota for reevaluation. Patient is currently being managed on the following psychiatric medications:   Abilify 5 mg daily Trazodone 100 mg at bedtime as well as an additional 50 mg dose as needed Hydroxyzine 25 mg 3 times daily as needed  Patient reports that she is "pissed off" from her trazodone prescription.  She reports that medication does not help her with her sleep and that she has only been able to get a couple of hours of sleep at a time.  Patient reports that just when she is about to fall asleep, the hospital staff is calling her name to wake up.  Patient describes her mood as upset and restless.  She denies depression and further denies anxiety.  Per nursing report, patient expressed depression as well as anxiety she rated a 5 out of 10.  Patient denies suicidal or homicidal ideations.  She further denies auditory or visual hallucinations and does not appear to be responding to internal/external stimuli.  Patient denies paranoia and further denies delusional thoughts.  Patient endorses poor sleep and states that she has no appetite.  Patient reports that she has been attending group sessions.  Principal Problem: Bipolar 2 disorder, major depressive episode ([place]) Diagnosis: Principal Problem:   Bipolar 2 disorder, major depressive episode ([place]) Active Problems:   Hypothyroid   Severe episode of recurrent major depressive disorder, without psychotic features ([place])   GAD (generalized anxiety disorder)  Total Time spent with patient: 15 minutes  Past Psychiatric  History:  Patient reports a past psychiatric history significant for major depressive disorder and anxiety.  Patient reports that she was diagnosed with major depressive disorder back in 2016.  Past Medical History:  Past Medical History:  Diagnosis Date   Anxiety    Depression    Dyspnea    11-01-2018 per pt "due to my thyroid"   History of concussion    8/ 2018  per pt residual resolved   History of ectopic pregnancy 03/2017   treated medically   Hypothyroidism    Multinodular thyroid 05/2017   incidental finding on CT imaging     Past Surgical History:  Procedure Laterality Date   CESAREAN SECTION N/A 06/24/2018   Procedure: CESAREAN SECTION;  Surgeon: Woodroe Mode, MD;  Location: [facility];  Service: Obstetrics;  Laterality: N/A;   THYROIDECTOMY N/A 11/08/2018   Procedure: TOTAL THYROIDECTOMY;  Surgeon: Armandina Gemma, MD;  Location: [facility];  Service: General;  Laterality: N/A;   Family History:  Family History  Problem Relation Age of Onset   Miscarriages / [place] Mother    Cancer Maternal Grandfather    Diabetes Paternal Grandmother    Heart disease Paternal Grandmother    Family Psychiatric  History:  Patient denies a family history of psychiatric illness and further denies anyone else in her family attempting suicide.  Social History:  Social History   Substance and Sexual Activity  Alcohol Use No     Social History   Substance and Sexual Activity  Drug Use Not Currently   Frequency: 1.0 times per week   Types: Marijuana,  Cocaine   Comment: 11-01-2018  per pt last cocaine 2018, last Marijuana use 12/12/17    Social History   Socioeconomic History   Marital status: Single    Spouse name: Not on file   Number of children: Not on file   Years of education: Not on file   Highest education level: Not on file  Occupational History   Not on file  Tobacco Use   Smoking status: Former    Packs/day: 0.25    Years: 4.00    Total pack years: 1.00     Types: Cigarettes    Quit date: 09/11/2017    Years since quitting: 4.8   Smokeless tobacco: Never  Vaping Use   Vaping Use: Never used  Substance and Sexual Activity   Alcohol use: No   Drug use: Not Currently    Frequency: 1.0 times per week    Types: Marijuana, Cocaine    Comment: 11-01-2018  per pt last cocaine 2018, last Marijuana use 12/12/17   Sexual activity: Yes    Partners: Male    Birth control/protection: Injection    Comment: Depo injection  Other Topics Concern   Not on file  Social History Narrative   Not on file   Social Determinants of Health   Financial Resource Strain: Low Risk  (06/20/2018)   Overall Financial Resource Strain (CARDIA)    Difficulty of Paying Living Expenses: Not hard at all  [place] Present (07/28/2022)   Hunger Vital Sign    Worried About Charity fundraiser in the Last Year: Often true    Ran Out of Food in the Last Year: Often true  Transportation Needs: Unmet Transportation Needs (07/28/2022)   PRAPARE - Hydrologist (Medical): No    Lack of Transportation (Non-Medical): Yes  Physical Activity: Sufficiently Active (06/20/2018)   Exercise Vital Sign    Days of Exercise per Week: 7 days    Minutes of Exercise per Session: 30 min  Stress: No Stress Concern Present (06/22/2018)   [employer] [place]    Feeling of Stress : Not at all  Social Connections: Not on file   Additional Social History:   Sleep: Poor  Appetite:  Poor  Current Medications: Current Facility-Administered Medications  Medication Dose Route Frequency Provider Last Rate Last Admin   acetaminophen (TYLENOL) tablet 650 mg  650 mg Oral Q6H PRN Tharon Aquas, NP       alum & mag hydroxide-simeth (MAALOX/MYLANTA) 200-200-20 MG/5ML suspension 30 mL  30 mL Oral Q4H PRN Tharon Aquas, NP       ARIPiprazole (ABILIFY) tablet 7.5 mg  7.5 mg Oral QHS  Massengill, Nathan, MD       hydrOXYzine (ATARAX) tablet 25 mg  25 mg Oral TID PRN Tharon Aquas, NP   25 mg at 07/30/22 2125   levothyroxine (SYNTHROID) tablet 100 mcg  100 mcg Oral QAC breakfast Tharon Aquas, NP   100 mcg at 07/31/22 0630   magnesium hydroxide (MILK OF MAGNESIA) suspension 30 mL  30 mL Oral Daily PRN Tharon Aquas, NP       melatonin tablet 5 mg  5 mg Oral QHS Massengill, Nathan, MD       nicotine (NICODERM CQ - dosed in mg/24 hours) patch 21 mg  21 mg Transdermal Q0600 Tharon Aquas, NP        Lab Results:  Results for orders  placed or performed during the hospital encounter of 07/28/22 (from the past 48 hour(s))  T3, free     Status: None   Collection Time: 07/30/22  6:15 PM  Result Value Ref Range   T3, Free 2.2 2.0 - 4.4 pg/mL    Comment: (NOTE) Performed At: [facility] [address] [place], [place] [id number] Jolene Schimke MD [id number]   T4, free     Status: None   Collection Time: 07/30/22  6:15 PM  Result Value Ref Range   Free T4 0.75 0.61 - 1.12 ng/dL    Comment: (NOTE) Biotin ingestion may interfere with free T4 tests. If the results are inconsistent with the TSH level, previous test results, or the clinical presentation, then consider biotin interference. If needed, order repeat testing after stopping biotin. Performed at [facility], [address] [place], [place] [address]     Blood Alcohol level:  Lab Results  Component Value Date   [facility] <10 07/28/2022   ETH <11 05/30/2011    Metabolic Disorder Labs: Lab Results  Component Value Date   HGBA1C 5.4 07/28/2022   MPG 108.28 07/28/2022   No results found for: "PROLACTIN" Lab Results  Component Value Date   CHOL 181 07/28/2022   TRIG 43 07/28/2022   HDL 53 07/28/2022   CHOLHDL 3.4 07/28/2022   VLDL 9 07/28/2022   LDLCALC 119 (H) 07/28/2022    Physical Findings: AIMS: Facial and Oral Movements Muscles of Facial Expression: None,  normal Lips and Perioral Area: None, normal Jaw: None, normal Tongue: None, normal,Extremity Movements Upper (arms, wrists, hands, fingers): None, normal Lower (legs, knees, ankles, toes): None, normal, Trunk Movements Neck, shoulders, hips: None, normal, Overall Severity Severity of abnormal movements (highest score from questions above): None, normal Incapacitation due to abnormal movements: None, normal Patient's awareness of abnormal movements (rate only patient's report): No Awareness, Dental Status Current problems with teeth and/or dentures?: No Does patient usually wear dentures?: No  CIWA:    COWS:     Musculoskeletal: Strength & Muscle Tone: within normal limits Gait & Station: normal Patient leans: N/A  Psychiatric Specialty Exam:  Presentation  General Appearance:  Appropriate for Environment; Casual  Eye Contact: Fair  Speech: Clear and Coherent; Normal Rate  Speech Volume: Normal  Handedness: Right   Mood and Affect  Mood: Depressed  Affect: Congruent   Thought Process  Thought Processes: Coherent; Goal Directed  Descriptions of Associations:Intact  Orientation:Full (Time, Place and Person)  Thought Content:WDL  History of Schizophrenia/Schizoaffective disorder:No  Duration of Psychotic Symptoms:No data recorded Hallucinations:Hallucinations: None  Ideas of Reference:None  Suicidal Thoughts:Suicidal Thoughts: No  Homicidal Thoughts:Homicidal Thoughts: No   Sensorium  Memory: Immediate Good; Recent Good; Remote Good  Judgment: Intact  Insight: Present   Executive Functions  Concentration: Good  Attention Span: Good  Recall: Fair  Fund of Knowledge: Good  Language: Good   Psychomotor Activity  Psychomotor Activity: Psychomotor Activity: Normal   Assets  Assets: Communication Skills; Desire for Improvement; Social Support; Vocational/Educational   Sleep  Sleep: Sleep: Poor    Physical  Exam: Physical Exam Constitutional:      Appearance: Normal appearance.  HENT:     Head: Normocephalic and atraumatic.     Nose: Nose normal.     Mouth/Throat:     Mouth: Mucous membranes are moist.  Eyes:     Extraocular Movements: Extraocular movements intact.     Pupils: Pupils are equal, round, and reactive to light.  Cardiovascular:  Rate and Rhythm: Normal rate and regular rhythm.  Pulmonary:     Effort: Pulmonary effort is normal.     Breath sounds: Normal breath sounds.  Abdominal:     General: Abdomen is flat.  Musculoskeletal:        General: Normal range of motion.     Cervical back: Normal range of motion and neck supple.  Skin:    General: Skin is warm and dry.  Neurological:     General: No focal deficit present.     Mental Status: She is alert and oriented to person, place, and time.  Psychiatric:        Attention and Perception: Attention and perception normal. She does not perceive auditory or visual hallucinations.        Mood and Affect: Mood is depressed. Affect is blunt.        Speech: Speech normal.        Behavior: Behavior normal. Behavior is cooperative.        Thought Content: Thought content is not paranoid or delusional. Thought content does not include homicidal or suicidal ideation.        Cognition and Memory: Cognition and memory normal.        Judgment: Judgment normal.    Review of Systems  Constitutional: Negative.   HENT: Negative.    Eyes: Negative.   Respiratory: Negative.    Cardiovascular: Negative.   Genitourinary: Negative.   Skin: Negative.   Neurological: Negative.   Psychiatric/Behavioral:  Positive for depression. Negative for hallucinations, substance abuse and suicidal ideas. The patient has insomnia. The patient is not nervous/anxious.    Blood pressure 113/84, pulse 87, temperature 97.9 F (36.6 C), temperature source Oral, resp. rate 18, height 5\' 4"  (1.626 m), weight 97.1 kg, SpO2 100 %, currently breastfeeding.  Body mass index is 36.73 kg/m.   Treatment Plan Summary:  Daily contact with patient to assess and evaluate symptoms and progress in treatment and Medication management  Safety and Monitoring: Voluntary admission to inpatient psychiatric unit for safety, stabilization and treatment Daily contact with patient to assess and evaluate symptoms and progress in treatment Patient's case to be discussed in multi-disciplinary team meeting Observation Level : q15 minute checks Vital signs: q12 hours Precautions: safety   Diagnosis:  Principal Problem:   MDD (major depressive disorder), recurrent severe, without psychosis (HCC)   #Major depressive disorder, recurrent severe, without psychosis -Increase Abilify to 7.5 mg at bedtime for depression and mood stability   #Insomnia -Stop trazodone 100 mg at bedtime for insomnia -Start melatonin 5 mg at bedtime for insomnia   As needed medications: -Patient to start taking Tylenol 650 mg every 6 hours as needed for mild pain -Patient to start taking Maalox/Mylanta 30 mL every 4 hours as needed for indigestion -Patient to start taking Milk of Magnesia 30 mL as needed for mild constipation -Patient to start hydroxyzine 25 mg 3 times daily as needed for anxiety -Patient to start trazodone 50 mg at bedtime as needed for insomnia    #Hypothyroidism -Continue levothyroxine 100 mcg daily before breakfast -TSH: 6.596 ulU/mL -T4: 0.75 ng/dL   #Tobacco dependence -Continue NicoDerm CQ 21 mg / 24-hour patch     Discharge Planning: Social work and case management to assist with discharge planning and identification of hospital follow-up needs prior to discharge Estimated LOS: 5-7 days Discharge Concerns: Need to establish a safety plan; Medication compliance and effectiveness Discharge Goals: Return home with outpatient referrals for mental health follow-up including  medication management/psychotherapy    I certify that inpatient services furnished  can reasonably be expected to improve the patient's condition.   Meta Hatchet, PA 07/31/2022, 3:34 PM

## 2022-08-01 DIAGNOSIS — F3181 Bipolar II disorder: Secondary | ICD-10-CM | POA: Diagnosis not present

## 2022-08-01 MED ORDER — MELATONIN 5 MG PO TABS: 5.0000 mg | ORAL_TABLET | Freq: Every day | ORAL | 0 refills | Status: DC

## 2022-08-01 MED ORDER — HYDROXYZINE HCL 25 MG PO TABS: 25.0000 mg | ORAL_TABLET | Freq: Three times a day (TID) | ORAL | 0 refills | Status: DC | PRN

## 2022-08-01 MED ORDER — LEVOTHYROXINE SODIUM 100 MCG PO TABS: 100.0000 ug | ORAL_TABLET | Freq: Every day | ORAL | 0 refills | Status: DC

## 2022-08-01 MED ORDER — ARIPIPRAZOLE 15 MG PO TABS: 7.5000 mg | ORAL_TABLET | Freq: Every day | ORAL | 0 refills | Status: DC

## 2022-08-01 MED ORDER — NICOTINE 21 MG/24HR TD PT24: 21.0000 mg | MEDICATED_PATCH | Freq: Every day | TRANSDERMAL | 0 refills | Status: AC

## 2022-08-01 NOTE — Plan of Care (Signed)
Pt is being discharged.

## 2022-08-01 NOTE — Progress Notes (Addendum)
DISCHARGE NOTE: All possessions/property returned to patient and signature obtained. Verbal and written education provided to patient regarding follow up care, treatment plan, and discharge medications. Pt verbalized comprehension and intent to comply. Pt currently denies suicidal ideations, denies homicidal ideations, denies visual hallucination, and denies auditory hallucinations. No c/o pain and/or discomfort. All precautions discontinued by physician. Pt remains calm, cooperative, medication-compliant, ambulatory and functioning at Pt's optimal level. There are no indications that this Pt is engaged in self-directed violent behavior, either preparatory or potentially harmful. Pt discharged home with parent.

## 2022-08-01 NOTE — Progress Notes (Signed)
   08/01/22 0530  Sleep  Number of Hours 7

## 2022-08-01 NOTE — Progress Notes (Signed)
  [facility] Adult Case Management Discharge Plan :  Will you be returning to the same living situation after discharge:  Yes,  with parent At discharge, do you have transportation home?: Yes,  parent Do you have the ability to pay for your medications: Yes,  insurance  Release of information consent forms completed and emailed to Medical Records, then turned in to Medical Records by CSW.   Patient to Follow up at:  [facility].   Specialty: Behavioral Health Why: Please call or go to this provider to schedule an assessment, to obtain therapy and medication management services; on a Monday or a Wednesday morning, arrive by 7:30 am.  Services are provided on a first come, first served basis.  Once you have scheduled and completed your assessment, appointments will be scheduled for therapy and medication management. Contact information: [place] [phone]                Next level of care provider has access to [place] and Suicide Prevention discussed: Yes,  with parent     Has patient been referred to the Quitline?: Patient refused referral  Patient has been referred for addiction treatment: N/A  Maretta Los, LCSW 08/01/2022, 8:48 AM

## 2022-08-01 NOTE — [facility] Group Notes (Signed)
Adult Psychoeducational Group Note  Date:  08/01/2022 Time:  9:41 AM  Group Topic/Focus:  Goals Group:   The focus of this group is to help patients establish daily goals to achieve during treatment and discuss how the patient can incorporate goal setting into their daily lives to aide in recovery.  Participation Level:  Active  Participation Quality:  Appropriate  Affect:  Appropriate  Cognitive:  Appropriate  Insight: Appropriate  Engagement in Group:  Engaged  Modes of Intervention:  Education  Kern Reap 08/01/2022, 9:41 AM

## 2022-08-01 NOTE — [facility] Suicide Risk Assessment (Addendum)
=Suicide Risk Assessment  Discharge Assessment    [facility] Discharge Suicide Risk Assessment  Principal Problem: Bipolar 2 disorder, major depressive episode ([place])  Discharge Diagnoses: Principal Problem:   Bipolar 2 disorder, major depressive episode ([place]) Active Problems:   Hypothyroid   Severe episode of recurrent major depressive disorder, without psychotic features ([place])   GAD (generalized anxiety disorder)  Total Time spent with patient: 1 hour  Musculoskeletal: Strength & Muscle Tone: within normal limits Gait & Station: normal Patient leans: N/A  Psychiatric Specialty Exam  Presentation  General Appearance:  Appropriate for Environment; Casual; Fairly Groomed  Eye Contact: Good  Speech: Clear and Coherent; Normal Rate  Speech Volume: Normal  Handedness: Right  Mood and Affect  Mood: Anxious  Duration of Depression Symptoms: Greater than two weeks  Affect: Congruent  Thought Process  Thought Processes: Coherent; Goal Directed  Descriptions of Associations:Intact  Orientation:Full (Time, Place and Person)  Thought Content:WDL  History of Schizophrenia/Schizoaffective disorder:No  Duration of Psychotic Symptoms:No data recorded Hallucinations:Hallucinations: None  Ideas of Reference:None  Suicidal Thoughts:Suicidal Thoughts: No SI Active Intent and/or Plan: -- (n/a)  Homicidal Thoughts:Homicidal Thoughts: No  Sensorium  Memory: Immediate Good; Recent Good; Remote Good  Judgment: Intact  Insight: Present  Executive Functions  Concentration: Good  Attention Span: Good  Recall: Fair  Fund of Knowledge: Good  Language: Good  Psychomotor Activity  Psychomotor Activity: Psychomotor Activity: Normal  Assets  Assets: Communication Skills; Desire for Improvement; Social Support; Vocational/Educational  Sleep  Sleep: Sleep: Good Number of Hours of Sleep: 7  Physical Exam: Physical Exam Vitals and nursing note  reviewed.  Constitutional:      Appearance: Normal appearance.  HENT:     Head: Normocephalic and atraumatic.     Right Ear: External ear normal.     Left Ear: External ear normal.     Nose: Nose normal.     Mouth/Throat:     Mouth: Mucous membranes are moist.     Pharynx: Oropharynx is clear.  Eyes:     Extraocular Movements: Extraocular movements intact.     Conjunctiva/sclera: Conjunctivae normal.     Pupils: Pupils are equal, round, and reactive to light.  Cardiovascular:     Rate and Rhythm: Normal rate.     Pulses: Normal pulses.  Pulmonary:     Effort: Pulmonary effort is normal.  Abdominal:     Palpations: Abdomen is soft.  Genitourinary:    Comments: Deferred Musculoskeletal:        General: Normal range of motion.     Cervical back: Normal range of motion.  Skin:    General: Skin is warm.  Neurological:     General: No focal deficit present.     Mental Status: She is alert and oriented to person, place, and time.  Psychiatric:        Mood and Affect: Mood normal.        Behavior: Behavior normal.    Review of Systems  Constitutional: Negative.  Negative for chills and fever.  HENT: Negative.  Negative for hearing loss and tinnitus.   Eyes: Negative.  Negative for blurred vision and double vision.  Respiratory: Negative.  Negative for cough, sputum production, shortness of breath and wheezing.   Cardiovascular: Negative.  Negative for chest pain and palpitations.  Gastrointestinal: Negative.  Negative for abdominal pain, constipation, diarrhea, heartburn, nausea and vomiting.  Genitourinary: Negative.  Negative for dysuria, frequency and urgency.  Musculoskeletal: Negative.  Negative for back pain, myalgias and  neck pain.  Skin: Negative.  Negative for itching and rash.  Neurological: Negative.  Negative for dizziness, tingling, tremors, speech change and headaches.  Endo/Heme/Allergies: Negative.  Negative for environmental allergies and polydipsia. Does not  bruise/bleed easily.  Psychiatric/Behavioral:  Positive for depression (stabilize with medication).    Blood pressure (!) 108/92, pulse 75, temperature 98.6 F (37 C), temperature source Oral, resp. rate 18, height 5\' 4"  (1.626 m), weight 97.1 kg, SpO2 99 %, currently breastfeeding. Body mass index is 36.73 kg/m.  Mental Status Per Nursing Assessment::   On Admission:  Suicidal ideation indicated by patient, Suicide plan, Self-harm thoughts  Demographic Factors:  Adolescent or young adult and Low socioeconomic status  Loss Factors: NA  Historical Factors: Prior suicide attempts and NA  Risk Reduction Factors:   Responsible for children under 87 years of age, Sense of responsibility to family, Employed, Living with another person, especially a relative, Positive therapeutic relationship, and Positive coping skills or problem solving skills  Continued Clinical Symptoms:  Severe Anxiety and/or Agitation Bipolar Disorder:   Bipolar II Depression:   Recent sense of peace/wellbeing More than one psychiatric diagnosis Previous Psychiatric Diagnoses and Treatments Medical Diagnoses and Treatments/Surgeries  Cognitive Features That Contribute To Risk:  Polarized thinking    Suicide Risk:  Minimal: No identifiable suicidal ideation.  Patients presenting with no risk factors but with morbid ruminations; may be classified as minimal risk based on the severity of the depressive symptoms   Follow-up Information     [facility] Follow up.   Specialty: Behavioral Health Why: Please call or go to this provider to schedule an assessment, to obtain therapy and medication management services; on a Monday or a Wednesday morning, arrive by 7:30 am.  Services are provided on a first come, first served basis.  Once you have scheduled and completed your assessment, appointments will be scheduled for therapy and medication management. Contact information: [phone] [place] [phone]                Plan Of Care/Follow-up recommendations:  Treatment Plan Summary:  During the patient's hospitalization, patient had extensive initial psychiatric evaluation, and follow-up psychiatric evaluations every day.  Psychiatric diagnoses provided upon initial assessment: Bipolar 2 disorder, major depressive episode, generalized anxiety disorder, severe episode of recurrent major depressive disorder without psychotic features.  Patient's psychiatric medications were adjusted on admission: Abilify 5 mg was started initially.  Then increase to 7.5 mg p.o. daily.  During the hospitalization, other adjustments were made to the patient's psychiatric medication regimen: Abilify was increased to 7.5 mg p.o. daily.  Patient's care was discussed during the interdisciplinary team meeting every day during the hospitalization.  The patient denies having side effects to prescribed psychiatric medication.  Gradually, patient started adjusting to milieu. The patient was evaluated each day by a clinical provider to ascertain response to treatment. Improvement was noted by the patient's report of decreasing symptoms, improved sleep and appetite, affect, medication tolerance, behavior, and participation in unit programming.  Patient was asked each day to complete a self inventory noting mood, mental status, pain, new symptoms, anxiety and concerns.    Symptoms were reported as significantly decreased or resolved completely by discharge.   On day of discharge, the patient reports that her mood is stable. The patient denied having suicidal thoughts for more than 48 hours prior to discharge.  Patient denies having homicidal thoughts.  Patient denies having auditory hallucinations.  Patient denies  any visual hallucinations or other symptoms of psychosis. The patient was motivated to continue taking medication with a goal of continued improvement in  mental health.   The patient reports her target psychiatric symptoms of mood swings and depression responded well to the psychiatric medications, and the patient reports overall benefit other psychiatric hospitalization. Supportive psychotherapy was provided to the patient. The patient also participated in regular group therapy while hospitalized. Coping skills, problem solving as well as relaxation therapies were also part of the unit programming.  Labs were reviewed with the patient, and abnormal results were discussed with the patient to include CMP: Total bilirubin less than 0.1 low.  Lipid panel: LDL 119 high, TSH 6.596 high.  Patient to follow-up with her primary care physician for abnormal labs after discharge.  The patient is able to verbalize her individual safety plan to this provider.  # It is recommended to the patient to continue psychiatric medications as prescribed, after discharge from the hospital.    # It is recommended to the patient to follow up with your outpatient psychiatric provider and PCP.  # It was discussed with the patient, the impact of alcohol, drugs, tobacco have been there overall psychiatric and medical wellbeing, and total abstinence from substance use was recommended the patient.  # Prescriptions provided or sent directly to preferred pharmacy at discharge. Patient agreeable to plan. Given opportunity to ask questions. Appears to feel comfortable with discharge.    # In the event of worsening symptoms, the patient is instructed to call the crisis hotline, 911 and or go to the nearest ED for appropriate evaluation and treatment of symptoms. To follow-up with primary care provider for other medical issues, concerns and or health care needs.  # Patient was discharged to home with a plan to follow up as noted above.   Activity:  As tolerated Diet:  Regular low salt  Cecilie Lowers, FNP 08/01/2022, 9:05 AM

## 2022-08-01 NOTE — Discharge Summary (Signed)
Physician Discharge Summary Note  Patient:  Taylor Carson is an 31 y.o., female  MRN:  [id number]  DOB:  10-Aug-1991  Patient phone:  [phone] (home)   Patient address:   [address] [place] [address],   Total Time spent with patient: 1 hour  Date of Admission:  07/28/2022 Date of Discharge:   08/01/2022  Reason for Admission: Taylor Carson is a 31 year old female with a past psychiatric history significant for major depressive disorder and anxiety who was admitted to [facility] from [facility] due to the suicidal ideations in the presence of worsening depression.   Principal Problem: Bipolar 2 disorder, major depressive episode [facility])  Discharge Diagnoses: Principal Problem:   Bipolar 2 disorder, major depressive episode (HCC) Active Problems:   Hypothyroid   Severe episode of recurrent major depressive disorder, without psychotic features (HCC)   GAD (generalized anxiety disorder)  Past Psychiatric History: Patient reports a past psychiatric history significant for major depressive disorder and anxiety.  Patient reports that she was diagnosed with major depressive disorder back in 2016.   Past Medical History:  Past Medical History:  Diagnosis Date   Anxiety    Depression    Dyspnea    11-01-2018 per pt "due to my thyroid"   History of concussion    8/ 2018  per pt residual resolved   History of ectopic pregnancy 03/2017   treated medically   Hypothyroidism    Multinodular thyroid 05/2017   incidental finding on CT imaging     Past Surgical History:  Procedure Laterality Date   CESAREAN SECTION N/A 06/24/2018   Procedure: CESAREAN SECTION;  Surgeon: Adam Phenix, MD;  Location: [facility];  Service: Obstetrics;  Laterality: N/A;   THYROIDECTOMY N/A 11/08/2018   Procedure: TOTAL THYROIDECTOMY;  Surgeon: [name], MD;  Location: [facility];  Service: General;  Laterality: N/A;   Family  History:  Family History  Problem Relation Age of Onset   Miscarriages / [place] Mother    Cancer Maternal Grandfather    Diabetes Paternal Grandmother    Heart disease Paternal Grandmother    Family Psychiatric  History: Patient denies a family history of psychiatric illness and further denies anyone else in her family attempting suicide.   Social History:  Social History   Substance and Sexual Activity  Alcohol Use No     Social History   Substance and Sexual Activity  Drug Use Not Currently   Frequency: 1.0 times per week   Types: Marijuana, Cocaine   Comment: 11-01-2018  per pt last cocaine 2018, last Marijuana use 12/12/17    Social History   Socioeconomic History   Marital status: Single    Spouse name: Not on file   Number of children: Not on file   Years of education: Not on file   Highest education level: Not on file  Occupational History   Not on file  Tobacco Use   Smoking status: Former    Packs/day: 0.25    Years: 4.00    Total pack years: 1.00    Types: Cigarettes    Quit date: 09/11/2017    Years since quitting: 4.8   Smokeless tobacco: Never  Vaping Use   Vaping Use: Never used  Substance and Sexual Activity   Alcohol use: No   Drug use: Not Currently    Frequency: 1.0 times per week    Types: Marijuana, Cocaine    Comment: 11-01-2018  per pt last cocaine 2018, last Marijuana use 12/12/17   Sexual activity: Yes    Partners: Male    Birth control/protection: Injection    Comment: Depo injection  Other Topics Concern   Not on file  Social History Narrative   Not on file   Social Determinants of Health   Financial Resource Strain: Low Risk  (06/20/2018)   Overall Financial Resource Strain (CARDIA)    Difficulty of Paying Living Expenses: Not hard at all  Food Insecurity: Food Insecurity Present (07/28/2022)   Hunger Vital Sign    Worried About Programme researcher, broadcasting/film/video in the Last Year: Often true    Ran Out of Food in the Last Year: Often true   Transportation Needs: Unmet Transportation Needs (07/28/2022)   PRAPARE - Administrator, Civil Service (Medical): No    Lack of Transportation (Non-Medical): Yes  Physical Activity: Sufficiently Active (06/20/2018)   Exercise Vital Sign    Days of Exercise per Week: 7 days    Minutes of Exercise per Session: 30 min  Stress: No Stress Concern Present (06/22/2018)   [employer] of Occupational Health - Occupational Stress Questionnaire    Feeling of Stress : Not at all  Social Connections: Not on file    Hospital Course:  Psychiatric diagnoses provided upon initial assessment: Bipolar 2 disorder, major depressive episode, generalized anxiety disorder, severe episode of recurrent major depressive disorder without psychotic features.   Patient's psychiatric medications were adjusted on admission: Abilify 5 mg was started initially.     During the hospitalization, other adjustments were made to the patient's psychiatric medication regimen: Abilify was increased to 7.5 mg p.o. daily.   Patient's care was discussed during the interdisciplinary team meeting every day during the hospitalization.   The patient denies having side effects to prescribed psychiatric medication.   Gradually, patient started adjusting to milieu. The patient was evaluated each day by a clinical provider to ascertain response to treatment. Improvement was noted by the patient's report of decreasing symptoms, improved sleep and appetite, affect, medication tolerance, behavior, and participation in unit programming.  Patient was asked each day to complete a self inventory noting mood, mental status, pain, new symptoms, anxiety and concerns.     Symptoms were reported as significantly decreased or resolved completely by discharge.    On day of discharge, the patient reports that her mood is stable. The patient denied having suicidal thoughts for more than 48 hours prior to discharge.  Patient denies having  homicidal thoughts.  Patient denies having auditory hallucinations.  Patient denies any visual hallucinations or other symptoms of psychosis. The patient was motivated to continue taking medication with a goal of continued improvement in mental health.    The patient reports her target psychiatric symptoms of mood swings and depression responded well to the psychiatric medications, and the patient reports overall benefit other psychiatric hospitalization. Supportive psychotherapy was provided to the patient. The patient also participated in regular group therapy while hospitalized. Coping skills, problem solving as well as relaxation therapies were also part of the unit programming.  Physical Findings: AIMS: Facial and Oral Movements Muscles of Facial Expression: None, normal Lips and Perioral Area: None, normal Jaw: None, normal Tongue: None, normal,Extremity Movements Upper (arms, wrists, hands, fingers): None, normal Lower (legs, knees, ankles, toes): None, normal, Trunk Movements Neck, shoulders, hips: None, normal, Overall Severity Severity of abnormal movements (highest score from questions above): None, normal Incapacitation due to abnormal movements: None, normal Patient's  awareness of abnormal movements (rate only patient's report): No Awareness, Dental Status Current problems with teeth and/or dentures?: No Does patient usually wear dentures?: No  CIWA:    COWS:     Musculoskeletal: Strength & Muscle Tone: within normal limits Gait & Station: normal Patient leans: N/A  Psychiatric Specialty Exam:  Presentation  General Appearance:  Appropriate for Environment; Casual; Fairly Groomed  Eye Contact: Good  Speech: Clear and Coherent; Normal Rate  Speech Volume: Normal  Handedness: Right  Mood and Affect  Mood: Anxious  Affect: Congruent  Thought Process  Thought Processes: Coherent; Goal Directed  Descriptions of Associations:Intact  Orientation:Full  (Time, Place and Person)  Thought Content:WDL  History of Schizophrenia/Schizoaffective disorder:No  Duration of Psychotic Symptoms:No data recorded Hallucinations:Hallucinations: None  Ideas of Reference:None  Suicidal Thoughts:Suicidal Thoughts: No SI Active Intent and/or Plan: -- (n/a)  Homicidal Thoughts:Homicidal Thoughts: No  Sensorium  Memory: Immediate Good; Recent Good; Remote Good  Judgment: Intact  Insight: Present  Executive Functions  Concentration: Good  Attention Span: Good  Recall: Fair  Fund of Knowledge: Good  Language: Good  Psychomotor Activity  Psychomotor Activity: Psychomotor Activity: Normal  Assets  Assets: Communication Skills; Desire for Improvement; Social Support; Vocational/Educational  Sleep  Sleep: Sleep: Good Number of Hours of Sleep: 7  Physical Exam: Physical Exam Vitals and nursing note reviewed.  Constitutional:      Appearance: Normal appearance.  HENT:     Head: Normocephalic and atraumatic.     Right Ear: External ear normal.     Left Ear: External ear normal.     Nose: Nose normal.     Mouth/Throat:     Mouth: Mucous membranes are moist.     Pharynx: Oropharynx is clear.  Eyes:     Conjunctiva/sclera: Conjunctivae normal.     Pupils: Pupils are equal, round, and reactive to light.  Cardiovascular:     Rate and Rhythm: Normal rate.     Pulses: Normal pulses.  Pulmonary:     Effort: Pulmonary effort is normal.  Abdominal:     Palpations: Abdomen is soft.  Genitourinary:    Comments: Deferred Musculoskeletal:        General: Normal range of motion.     Cervical back: Normal range of motion and neck supple.  Skin:    General: Skin is warm.  Neurological:     General: No focal deficit present.     Mental Status: She is alert and oriented to person, place, and time.  Psychiatric:        Mood and Affect: Mood normal.        Behavior: Behavior normal.    Review of Systems  Constitutional:  Negative.  Negative for chills and fever.  HENT: Negative.  Negative for hearing loss and tinnitus.   Eyes: Negative.  Negative for blurred vision and double vision.  Respiratory: Negative.  Negative for cough, sputum production, shortness of breath and wheezing.   Cardiovascular: Negative.  Negative for chest pain and palpitations.  Gastrointestinal: Negative.  Negative for constipation, diarrhea, heartburn, nausea and vomiting.  Genitourinary: Negative.  Negative for dysuria, frequency and urgency.  Musculoskeletal: Negative.  Negative for myalgias and neck pain.  Skin: Negative.  Negative for itching and rash.  Neurological: Negative.  Negative for dizziness, tingling, tremors, sensory change and headaches.  Endo/Heme/Allergies: Negative.  Negative for environmental allergies and polydipsia. Does not bruise/bleed easily.  Psychiatric/Behavioral:  Positive for depression (Stabilize with meds).    Blood pressure (!) 108/92,  pulse 75, temperature 98.6 F (37 C), temperature source Oral, resp. rate 18, height 5\' 4"  (1.626 m), weight 97.1 kg, SpO2 99 %, currently breastfeeding. Body mass index is 36.73 kg/m.   Social History   Tobacco Use  Smoking Status Former   Packs/day: 0.25   Years: 4.00   Total pack years: 1.00   Types: Cigarettes   Quit date: 09/11/2017   Years since quitting: 4.8  Smokeless Tobacco Never   Tobacco Cessation:  A prescription for an FDA-approved tobacco cessation medication provided at discharge   Blood Alcohol level:  Lab Results  Component Value Date   [facility] <10 07/28/2022   ETH <11 05/30/2011    Metabolic Disorder Labs:  Lab Results  Component Value Date   HGBA1C 5.4 07/28/2022   MPG 108.28 07/28/2022   No results found for: "PROLACTIN" Lab Results  Component Value Date   CHOL 181 07/28/2022   TRIG 43 07/28/2022   HDL 53 07/28/2022   CHOLHDL 3.4 07/28/2022   VLDL 9 07/28/2022   LDLCALC 119 (H) 07/28/2022    See Psychiatric Specialty Exam  and Suicide Risk Assessment completed by Attending Physician prior to discharge.  Discharge destination:  Home  Is patient on multiple antipsychotic therapies at discharge:  No   Has Patient had three or more failed trials of antipsychotic monotherapy by history:  No  Recommended Plan for Multiple Antipsychotic Therapies: NA  Discharge Instructions     Diet - low sodium heart healthy   Complete by: As directed    Increase activity slowly   Complete by: As directed       Allergies as of 08/01/2022   No Known Allergies      Medication List     TAKE these medications      Indication  ARIPiprazole 15 MG tablet Commonly known as: ABILIFY Take 0.5 tablets (7.5 mg total) by mouth at bedtime.  Indication: Major Depressive Disorder   hydrOXYzine 25 MG tablet Commonly known as: ATARAX Take 1 tablet (25 mg total) by mouth 3 (three) times daily as needed for anxiety.  Indication: Feeling Anxious   levothyroxine 100 MCG tablet Commonly known as: SYNTHROID Take 1 tablet (100 mcg total) by mouth daily before breakfast. Start taking on: August 02, 2022 What changed: additional instructions  Indication: Underactive Thyroid   melatonin 5 MG Tabs Take 1 tablet (5 mg total) by mouth at bedtime.  Indication: Trouble Sleeping   nicotine 21 mg/24hr patch Commonly known as: NICODERM CQ - dosed in mg/24 hours Place 1 patch (21 mg total) onto the skin daily at 6 (six) AM. Start taking on: August 02, 2022  Indication: Nicotine Addiction        Follow-up Information     [facility] Follow up.   Specialty: Behavioral Health Why: Please call or go to this provider to schedule an assessment, to obtain therapy and medication management services; on a Monday or a Wednesday morning, arrive by 7:30 am.  Services are provided on a first come, first served basis.  Once you have scheduled and completed your assessment, appointments will be scheduled for  therapy and medication management. Contact information: [phone] [place] [phone]                Follow-up recommendations:  Activity:  As tolerated Diet:  Regular Diet  Comments:  On day of discharge, the patient reports that her mood is stable. The patient denied having suicidal thoughts for  more than 48 hours prior to discharge.  Patient denies having homicidal thoughts.  Patient denies having auditory hallucinations.  Patient denies any visual hallucinations or other symptoms of psychosis. The patient was motivated to continue taking medication with a goal of continued improvement in mental health.    The patient reports their target psychiatric symptoms of mood swings and depression responded well to the psychiatric medications, and the patient reports overall benefit other psychiatric hospitalization. Supportive psychotherapy was provided to the patient. The patient also participated in regular group therapy while hospitalized. Coping skills, problem solving as well as relaxation therapies were also part of the unit programming.   Labs were reviewed with the patient, and abnormal results were discussed with the patient to include CMP: Total bilirubin less than 0.1 low.  Lipid panel: LDL 119 high, TSH 6.596 high.  Patient to follow-up with her primary care physician for abnormal labs after discharge.   The patient is able to verbalize her individual safety plan to this provider.   # It is recommended to the patient to continue psychiatric medications as prescribed, after discharge from the hospital.     # It is recommended to the patient to follow up with your outpatient psychiatric provider and PCP.   # It was discussed with the patient, the impact of alcohol, drugs, tobacco have been there overall psychiatric and medical wellbeing, and total abstinence from substance use was recommended the patient.   # Prescriptions provided or sent directly to  preferred pharmacy at discharge. Patient agreeable to plan. Given opportunity to ask questions. Appears to feel comfortable with discharge.    # In the event of worsening symptoms, the patient is instructed to call the crisis hotline, 911 and or go to the nearest ED for appropriate evaluation and treatment of symptoms. To follow-up with primary care provider for other medical issues, concerns and or health care needs.   # Patient was discharged to home with a plan to follow up as noted above.    Signed: Laretta Bolster, FNP 08/01/2022, 9:15 AM

## 2022-08-01 NOTE — Group Note (Signed)
[facility] LCSW Group Therapy Note  08/01/2022  10:00-11:00AM  Type of Therapy and Topic:  Group Therapy:  Grief   Participation Level:  Did Not Attend   Description of Group:  Patients in this group asked to talk about grief during this session.  As the topic was introduced, we talked about grief involving death much of the time, but also that it is the emotion we may experience at the loss of a relationship, job, pet, etc.  Patients were introduced to the well-known Kubler-Ross concept of 5 stages of grief (Denial, Anger, Bargaining, Depression, Acceptance) and the fact that these stages are often not linear and people may go through one or more stages numerous times, although with greater ease each subsequent time.  This led to a discussion about patients' sources of grief and what their experiences have been in going through this process.  Therapeutic Goals: 1)  learn about the 5 stages of grief 2)  share patients' direct experiences in going through these stages  3)  give an opportunity to each patient to say out loud what they would like to say to a person they are currently grieving  4) discuss actions that could possibly help in the grief process such as writing a letter, journaling, listening to music, exercising, saying names out loud, and more   Summary of Patient Progress:  Patient was invited to group, did not attend.   Therapeutic Modalities:   Motivational Interviewing Processing  [place] [facility] [place]      

## 2022-08-03 NOTE — Progress Notes (Signed)
    SUBJECTIVE:   CHIEF COMPLAINT / HPI:   Depression Inpt Psychiatric admission for depression and suicidal ideation Date of Admission:  07/28/2022 Date of Discharge:   08/01/2022 Feeling much improved.  Her family is a big support.  Her insurance would not pay for Abilify that she was discharged on.  Apparently she called and they suggested seeing her PCP to choose another medication. Checked one on question 9 on PHQ9 but feels this is chronic and has not plans or concerns she might act on these thoughts   HypoThyroid Taking her medications regularly  OBJECTIVE:   BP 120/70   Pulse 70   Ht 5\' 4"  (1.626 m)   Wt 215 lb 9.6 oz (97.8 kg)   LMP 08/04/2022 (Exact Date)   SpO2 100%   BMI 37.01 kg/m   Psych:  Cognition and judgment appear intact. Alert, communicative  and cooperative with normal attention span and concentration. No apparent delusions, illusions, hallucinations   ASSESSMENT/PLAN:   Postoperative hypothyroidism Assessment & Plan: Last labs in [facility] showed mild elevation of TSH but normal T4.  Will monitor.  Clinically euthyroid.   Severe episode of recurrent major depressive disorder, without psychotic features [facility]) Assessment & Plan: Seems much improved from recent hospitilization despite not being an abilify that was prescribed by [facility] but not covered her her insurance.  See after visit summary       Patient Instructions  Good to see you today - Thank you for coming in  Things we discussed today:  Abilify Contact the prescriber and let them know that I do not know appropriate alternatives If you have a coupon let me know if I need to prescribe I would go to the walk in follow up appointment today or tomorrow Let me know by Friday if you can not get the medication  Congrats on stopping smoking  Exercise - try to walk about 5-20 minutes every day  Come back to see me in 1-2 months for a PAP Smear    Lind Covert, MD [place]

## 2022-08-04 ENCOUNTER — Ambulatory Visit: Payer: Medicaid Other | Admitting: Family Medicine

## 2022-08-04 ENCOUNTER — Encounter: Payer: Self-pay | Admitting: Family Medicine

## 2022-08-04 ENCOUNTER — Other Ambulatory Visit: Payer: Self-pay

## 2022-08-04 ENCOUNTER — Ambulatory Visit (INDEPENDENT_AMBULATORY_CARE_PROVIDER_SITE_OTHER): Payer: Medicaid Other | Admitting: Psychiatry

## 2022-08-04 ENCOUNTER — Encounter (HOSPITAL_COMMUNITY): Payer: Self-pay | Admitting: Psychiatry

## 2022-08-04 DIAGNOSIS — F411 Generalized anxiety disorder: Secondary | ICD-10-CM | POA: Diagnosis not present

## 2022-08-04 DIAGNOSIS — E890 Postprocedural hypothyroidism: Secondary | ICD-10-CM

## 2022-08-04 DIAGNOSIS — F332 Major depressive disorder, recurrent severe without psychotic features: Secondary | ICD-10-CM

## 2022-08-04 DIAGNOSIS — F3181 Bipolar II disorder: Secondary | ICD-10-CM | POA: Diagnosis not present

## 2022-08-04 DIAGNOSIS — Z720 Tobacco use: Secondary | ICD-10-CM | POA: Diagnosis not present

## 2022-08-04 MED ORDER — NICOTINE POLACRILEX 2 MG MT GUM
2.0000 mg | CHEWING_GUM | OROMUCOSAL | 3 refills | Status: AC | PRN
  Filled 2022-08-04: qty 100, 30d supply, fill #0

## 2022-08-04 MED ORDER — HYDROXYZINE HCL 25 MG PO TABS
25.0000 mg | ORAL_TABLET | Freq: Three times a day (TID) | ORAL | 3 refills | Status: DC
  Filled 2022-08-04: qty 90, 30d supply, fill #0

## 2022-08-04 MED ORDER — MELATONIN 5 MG PO TABS
5.0000 mg | ORAL_TABLET | Freq: Every day | ORAL | 3 refills | Status: DC
  Filled 2022-08-04: qty 30, 30d supply, fill #0

## 2022-08-04 MED ORDER — ARIPIPRAZOLE 15 MG PO TABS
7.5000 mg | ORAL_TABLET | Freq: Every day | ORAL | 3 refills | Status: DC
  Filled 2022-08-04: qty 30, 60d supply, fill #0

## 2022-08-04 NOTE — Patient Instructions (Addendum)
Good to see you today - Thank you for coming in  Things we discussed today:  Abilify Contact the prescriber and let them know that I do not know appropriate alternatives If you have a coupon let me know if I need to prescribe I would go to the walk in follow up appointment today or tomorrow Let me know by Friday if you can not get the medication  Congrats on stopping smoking  Exercise - try to walk about 5-20 minutes every day  Come back to see me in 1-2 months for a PAP Smear

## 2022-08-04 NOTE — Assessment & Plan Note (Signed)
Seems much improved from recent hospitilization despite not being an abilify that was prescribed by [facility] but not covered her her insurance.  See after visit summary

## 2022-08-04 NOTE — Progress Notes (Signed)
Psychiatric Initial Adult Assessment   Patient Identification: Taylor Carson MRN:  [id number] Date of Evaluation:  08/04/2022 Referral Source: [facility] discharge Chief Complaint:  " Had medication sent by hospitalization" Visit Diagnosis:    ICD-10-CM   1. Bipolar 2 disorder, major depressive episode (HCC)  F31.81 ARIPiprazole (ABILIFY) 15 MG tablet    melatonin 5 MG TABS    Ambulatory referral to Social Work    2. Tobacco abuse  Z72.0 nicotine polacrilex (NICORETTE) 2 MG gum    3. GAD (generalized anxiety disorder)  F41.1 hydrOXYzine (ATARAX) 25 MG tablet    melatonin 5 MG TABS    Ambulatory referral to Social Work      History of Present Illness: 31 year old female seen today for initial psychiatric evaluation.  Patient was admitted to [facility] on 07/14/2022 through 07/21/2022 where she presented with worsening depression and suicidal ideation.  She has a psychiatric history of depression, bipolar 2, anxiety, SI/SA, and tobacco dependence.  Currently she is managed on Nicorette CQ 21 mg patches, melatonin 5 mg nightly as needed, hydroxyzine 25 mg 3 times daily as needed, and Abilify 7.5 mg nightly.  Patient informed Probation officer that she is not had her medications since her hospitalization.  She informed Probation officer that when she went to pick it up her prescriptions were over $600.  Today she is well-groomed, pleasant, cooperative, engaged in conversation, maintained eye contact.  Patient informed Probation officer that she experiences symptoms of hypomania such as fluctuations in mood, racing thoughts, and irritability.  Prior to her hospitalization patient notes that she was sexually impulsive.  She also informed Probation officer that she had increased energy and lack of sleep.  Patient notes that her sleep continues to be poor reporting that she sleeps 2 to 3 hours nightly.  Today she endorses passive SI but denies wanting to harm herself.  Patient did Artist that she attempted to harm herself in the past by  intentionally overdosing on prescription drugs.  At another time she notes that she thought about cutting an artery.  Patient denies having current plan.  Patient informed Probation officer that she continues to be anxious and depressed most days.  She notes that she worries about her 22-year-old son who now resides in [place] with his father.  She notes that this is the first time that he has been away from her since he was born.  She informed Probation officer that she wants her mental health to be stable so that she could be better for him and regain custody.  Patient also informed Probation officer that she worries about her finances, her job, and her health.  Provider conducted a GAD-7 and patient scored a 17.  Provider also conducted PHQ-9 and patient scored a 10.  She endorses fluctuations in appetite.  Today she denies VAH.  She does note that at times she feels paranoid that someone is out to get her.  Patient denies illegal drug use.  She notes that she last used marijuana prior to her hospitalization.  She also notes that she is not smoking tobacco since her hospitalization.  Today she is agreeable to restarting Abilify 7.5 mg nightly, hydroxyzine 25 mg 3 times daily, and melatonin 5 mg nightly as needed.  At this time she wishes to discontinue her Nicorette patch but reports that she would like to start the Nicorette gum.  Patient prescribed Nicorette gum 2 mg.  Patient also referred to outpatient counseling for therapy.  Patient's medications sent to [place] so that she  will be able to for them.   Associated Signs/Symptoms: Depression Symptoms:  depressed mood, anhedonia, insomnia, psychomotor agitation, fatigue, feelings of worthlessness/guilt, difficulty concentrating, recurrent thoughts of death, suicidal thoughts without plan, anxiety, disturbed sleep, increased appetite, decreased appetite, (Hypo) Manic Symptoms:  Elevated Mood, Flight of Ideas, Irritable Mood, Anxiety Symptoms:   Excessive Worry, Psychotic Symptoms:  Paranoia, PTSD Symptoms: Had a traumatic exposure:  Denies  Past Psychiatric History: Bipolar 2, depression, anxiety  Previous Psychotropic Medications:  Abilify, Trazodone, Melatonin, Nicotine CQ patch, hydroxyzine, Prozac (disliked made more depressed)  Substance Abuse History in the last 12 months:  Yes.    Consequences of Substance Abuse: NA  Past Medical History:  Past Medical History:  Diagnosis Date   Anxiety    Depression    Dyspnea    11-01-2018 per pt "due to my thyroid"   History of concussion    8/ 2018  per pt residual resolved   History of ectopic pregnancy 03/2017   treated medically   Hypothyroidism    Multinodular thyroid 05/2017   incidental finding on CT imaging     Past Surgical History:  Procedure Laterality Date   CESAREAN SECTION N/A 06/24/2018   Procedure: CESAREAN SECTION;  Surgeon: Woodroe Mode, MD;  Location: [place];  Service: Obstetrics;  Laterality: N/A;   THYROIDECTOMY N/A 11/08/2018   Procedure: TOTAL THYROIDECTOMY;  Surgeon: Armandina Gemma, MD;  Location: [facility];  Service: General;  Laterality: N/A;    Family Psychiatric History: Denies Family History:  Family History  Problem Relation Age of Onset   71 / Stillbirths Mother    Cancer Maternal Grandfather    Diabetes Paternal Grandmother    Heart disease Paternal Grandmother     Social History:   Social History   Socioeconomic History   Marital status: Single    Spouse name: Not on file   Number of children: Not on file   Years of education: Not on file   Highest education level: Not on file  Occupational History   Not on file  Tobacco Use   Smoking status: Former    Packs/day: 0.25    Years: 4.00    Total pack years: 1.00    Types: Cigarettes    Quit date: 09/11/2017    Years since quitting: 4.8   Smokeless tobacco: Never  Vaping Use   Vaping Use: Never used  Substance and Sexual Activity   Alcohol use: No    Drug use: Not Currently    Frequency: 1.0 times per week    Types: Marijuana, Cocaine    Comment: 11-01-2018  per pt last cocaine 2018, last Marijuana use 12/12/17   Sexual activity: Yes    Partners: Male    Birth control/protection: Injection    Comment: Depo injection  Other Topics Concern   Not on file  Social History Narrative   Not on file   Social Determinants of Health   Financial Resource Strain: Low Risk  (06/20/2018)   Overall Financial Resource Strain (CARDIA)    Difficulty of Paying Living Expenses: Not hard at all  Food Insecurity: Food Insecurity Present (07/28/2022)   Hunger Vital Sign    Worried About [place] in the Last Year: Often true    Ran Out of Food in the Last Year: Often true  Transportation Needs: Unmet Transportation Needs (07/28/2022)   PRAPARE - Hydrologist (Medical): No    Lack of Transportation (Non-Medical): Yes  Physical  Activity: Sufficiently Active (06/20/2018)   Exercise Vital Sign    Days of Exercise per Week: 7 days    Minutes of Exercise per Session: 30 min  Stress: No Stress Concern Present (06/22/2018)   [place]    Feeling of Stress : Not at all  Social Connections: Not on file    Additional Social History: Patient resides with her mother in [place]. She is dating and has a four year son who resides in [place]. She works at [employer]. She denies alcohol use. She notes that she has not had tobacco or marijuana since her hospitalization.   Allergies:  No Known Allergies  Metabolic Disorder Labs: Lab Results  Component Value Date   HGBA1C 5.4 07/28/2022   MPG 108.28 07/28/2022   No results found for: "PROLACTIN" Lab Results  Component Value Date   CHOL 181 07/28/2022   TRIG 43 07/28/2022   HDL 53 07/28/2022   CHOLHDL 3.4 07/28/2022   VLDL 9 07/28/2022   LDLCALC 119 (H) 07/28/2022   Lab Results  Component  Value Date   TSH 6.596 (H) 07/28/2022    Therapeutic Level Labs: No results found for: "LITHIUM" No results found for: "CBMZ" No results found for: "VALPROATE"  Current Medications: Current Outpatient Medications  Medication Sig Dispense Refill   nicotine polacrilex (NICORETTE) 2 MG gum Take 1 each (2 mg total) by mouth as needed for smoking cessation. 90 tablet 3   ARIPiprazole (ABILIFY) 15 MG tablet Take 0.5 tablets (7.5 mg total) by mouth at bedtime. 30 tablet 3   hydrOXYzine (ATARAX) 25 MG tablet Take 1 tablet (25 mg total) by mouth 3 (three) times daily. 90 tablet 3   levothyroxine (SYNTHROID) 100 MCG tablet Take 1 tablet (100 mcg total) by mouth daily before breakfast. 30 tablet 0   melatonin 5 MG TABS Take 1 tablet (5 mg total) by mouth at bedtime. 30 tablet 3   nicotine (NICODERM CQ - DOSED IN MG/24 HOURS) 21 mg/24hr patch Place 1 patch (21 mg total) onto the skin daily at 6 (six) AM. 28 patch 0   No current facility-administered medications for this visit.    Musculoskeletal: Strength & Muscle Tone: within normal limits Gait & Station: normal Patient leans: N/A  Psychiatric Specialty Exam: Review of Systems  Last menstrual period 08/04/2022, currently breastfeeding.There is no height or weight on file to calculate BMI.  General Appearance: Well Groomed  Eye Contact:  Good  Speech:  Clear and Coherent and Normal Rate  Volume:  Normal  Mood:  Anxious and Depressed  Affect:  Appropriate and Congruent  Thought Process:  Coherent, Goal Directed, and Linear  Orientation:  Full (Time, Place, and Person)  Thought Content:  WDL and Logical  Suicidal Thoughts:  No  Homicidal Thoughts:  No  Memory:  Immediate;   Good Recent;   Good Remote;   Good  Judgement:  Good  Insight:  Good  Psychomotor Activity:  Normal  Concentration:  Concentration: Good and Attention Span: Good  Recall:  Good  Fund of Knowledge:Good  Language: Good  Akathisia:  No  Handed:  Right  AIMS (if  indicated):  not done  Assets:  Communication Skills Desire for Improvement Financial Resources/Insurance Housing Leisure Time Physical Health Social Support Transportation  ADL's:  Intact  Cognition: WNL  Sleep:  Good   Screenings: AIMS    Flowsheet Row Admission (Discharged) from 07/28/2022 in [place] [facility]  AIMS Total Score 0      AUDIT    Flowsheet Row Admission (Discharged) from 07/28/2022 in [place] [facility]  Alcohol Use Disorder Identification Test Final Score (AUDIT) 0      GAD-7    Flowsheet Row Office Visit from 08/04/2022 in [facility] Most recent reading at 08/04/2022 11:56 AM Office Visit from 08/04/2022 in [place] Most recent reading at 08/04/2022 10:38 AM Routine Prenatal from 04/13/2018 in [place] Most recent reading at 04/13/2018 11:02 AM Routine Prenatal from 03/30/2018 in [place] Most recent reading at 03/30/2018 10:46 AM  Total GAD-7 Score 17 15 11 15       PHQ2-9    [place] Visit from 08/04/2022 in [facility] Most recent reading at 08/04/2022 11:51 AM Office Visit from 08/04/2022 in [place] Most recent reading at 08/04/2022 10:37 AM ED from 07/28/2022 in [facility] Most recent reading at 07/28/2022 12:41 PM Office Visit from 08/07/2021 in [place] Most recent reading at 08/07/2021 11:18 AM Office Visit from 07/31/2020 in [place] Most recent reading at 07/31/2020 10:02 AM  PHQ-2 Total Score 1 1 4 2 3   PHQ-9 Total Score 10 8 19 5 8       [place] from 08/04/2022 in [facility] Most recent reading at 08/04/2022 11:51 AM Admission (Discharged) from 07/28/2022 in [place] [facility] Most recent reading at 07/28/2022  5:52 PM ED from 07/28/2022 in [facility] Most recent reading at 07/28/2022  1:31 PM  C-SSRS RISK CATEGORY Error: Q7 should not be populated when Q6 is No High Risk High Risk       Assessment and Plan: Patient reports that she has not had her medications since her hospitalization.  Patient is tearful today and notes that she would like to restart medications to help manage her mood.  Today she is agreeable to restarting Abilify 7.5 mg nightly, hydroxyzine 25 mg 3 times daily, and melatonin 5 mg nightly as needed.  At this time she wishes to discontinue her Nicorette patch but reports that she would like to start the Nicorette gum.  Patient prescribed Nicorette gum 2 mg.  Patient also referred to outpatient counseling for therapy.  1. Bipolar 2 disorder, major depressive episode (HCC)  Restart- ARIPiprazole (ABILIFY) 15 MG tablet; Take 0.5 tablets (7.5 mg total) by mouth at bedtime.  Dispense: 30 tablet; Refill: 3 Restart- melatonin 5 MG TABS; Take 1 tablet (5 mg total) by mouth at bedtime.  Dispense: 30 tablet; Refill: 3 - Ambulatory referral to Social Work  2. Tobacco abuse  Start- nicotine polacrilex (NICORETTE) 2 MG gum; Take 1 each (2 mg total) by mouth as needed for smoking cessation.  Dispense: 90 tablet; Refill: 3  3. GAD (generalized anxiety disorder)  Restart- hydrOXYzine (ATARAX) 25 MG tablet; Take 1 tablet (25 mg total) by mouth 3 (three) times daily.  Dispense: 90 tablet; Refill: 3 Restart- melatonin 5 MG TABS; Take 1 tablet (5 mg total) by mouth at bedtime.  Dispense: 30 tablet; Refill: 3 - Ambulatory referral to Social Work    Collaboration of Care: Other provider involved in patient's care AEB counselor  Patient/Guardian was advised Release of Information must be obtained prior to any record release in order to collaborate their care  with an outside provider. Patient/Guardian was advised if they  have not already done so to contact the registration department to sign all necessary forms in order for [place] to release information regarding their care.   Consent: Patient/Guardian gives verbal consent for treatment and assignment of benefits for services provided during this visit. Patient/Guardian expressed understanding and agreed to proceed.   Follow up in 3 months Follow up with therapy  Salley Slaughter, NP 10/24/202312:29 PM

## 2022-08-04 NOTE — Assessment & Plan Note (Signed)
Last labs in [facility] showed mild elevation of TSH but normal T4.  Will monitor.  Clinically euthyroid.

## 2022-08-26 ENCOUNTER — Ambulatory Visit (HOSPITAL_COMMUNITY): Payer: Medicaid Other | Admitting: Mental Health

## 2022-09-01 ENCOUNTER — Ambulatory Visit: Payer: Medicaid Other | Admitting: Family Medicine

## 2022-10-08 ENCOUNTER — Other Ambulatory Visit (HOSPITAL_COMMUNITY)
Admission: RE | Admit: 2022-10-08 | Discharge: 2022-10-08 | Disposition: A | Discharge status: Home / Self Care | Payer: Medicaid Other | Source: Ambulatory Visit | Attending: Family Medicine | Admitting: Family Medicine

## 2022-10-08 ENCOUNTER — Encounter: Payer: Self-pay | Admitting: Family Medicine

## 2022-10-08 ENCOUNTER — Ambulatory Visit: Payer: Medicaid Other | Admitting: Family Medicine

## 2022-10-08 VITALS — BP 109/75 | HR 74 | Ht 64.0 in | Wt 224.4 lb

## 2022-10-08 DIAGNOSIS — Z124 Encounter for screening for malignant neoplasm of cervix: Secondary | ICD-10-CM

## 2022-10-08 DIAGNOSIS — M25552 Pain in left hip: Secondary | ICD-10-CM

## 2022-10-08 DIAGNOSIS — F332 Major depressive disorder, recurrent severe without psychotic features: Secondary | ICD-10-CM

## 2022-10-08 NOTE — Assessment & Plan Note (Signed)
Seems consistent with intermittent sciatica or muscle spasm.   No signs of significant nerve compression or hip pathology.  See after visit summary

## 2022-10-08 NOTE — Patient Instructions (Signed)
Good to see you today - Thank you for coming in  Things we discussed today:  Sciatica - Keep active - When it hurts take:  Tylenol 1000 mg three times a day if not helping enough add  Ibuprofen 600 mg (three tabs) three times a day - If not improving in a few days or if weakness or severe pain then come back in  We will let you know about your lab results  Follow up with your counselor and behavioral health

## 2022-10-08 NOTE — Progress Notes (Signed)
    SUBJECTIVE:   CHIEF COMPLAINT / HPI:   L Leg Pain Episodes of left sided lower back pain that radiates down her upper thigh.  No weakness or incontinence.  Goes away by itself.  Hasn't used in any meds  Depression Stopped taking abilify made her too sleepy.  Has appointment to follow up with therapist in Jan.  Overall feels she is doing well   OBJECTIVE:   BP 109/75   Pulse 74   Ht 5\' 4"  (1.626 m)   Wt 224 lb 6.4 oz (101.8 kg)   LMP 09/21/2022   SpO2 100%   BMI 38.52 kg/m     Back - Normal skin, Spine with normal alignment and no deformity.  No tenderness to vertebral process palpation.  Paraspinous muscles are not tender and without spasm.   Range of motion is full at neck and lumbar sacral regions Able to walk on heels and toes and do deep knee bend Genitalia:  Normal introitus for age, no external lesions, no vaginal discharge, mucosa pink and moist, no vaginal or cervical lesions, no vaginal atrophy, no friaility or hemorrhage,  Psych:  Cognition and judgment appear intact. Alert, communicative  and cooperative with normal attention span and concentration. No apparent delusions, illusions, hallucinations   ASSESSMENT/PLAN:   Screening for cervical cancer -     Cytology - PAP  Severe episode of recurrent major depressive disorder, without psychotic features (HCC) Assessment & Plan: Stable.  Encouraged to go back to [facility] and to follow up with therapist    Left hip pain Assessment & Plan: Seems consistent with intermittent sciatica or muscle spasm.   No signs of significant nerve compression or hip pathology.  See after visit summary       Patient Instructions  Good to see you today - Thank you for coming in  Things we discussed today:  Sciatica - Keep active - When it hurts take:  Tylenol 1000 mg three times a day if not helping enough add  Ibuprofen 600 mg (three tabs) three times a day - If not improving in a few days or if weakness or severe pain then  come back in  We will let you know about your lab results  Follow up with your counselor and behavioral health    [facility], MD [facility]

## 2022-10-08 NOTE — Assessment & Plan Note (Signed)
Stable.  Encouraged to go back to [facility] and to follow up with therapist

## 2022-10-09 LAB — CYTOLOGY - PAP
Adequacy: ABSENT
Chlamydia: NEGATIVE
Comment: NEGATIVE
Comment: NEGATIVE
Comment: NORMAL
Diagnosis: NEGATIVE
Neisseria Gonorrhea: NEGATIVE
Trichomonas: NEGATIVE

## 2022-11-02 ENCOUNTER — Ambulatory Visit: Payer: Medicaid Other | Admitting: Student

## 2022-11-02 ENCOUNTER — Other Ambulatory Visit: Payer: Self-pay | Admitting: Family Medicine

## 2022-11-02 ENCOUNTER — Encounter: Payer: Self-pay | Admitting: Student

## 2022-11-02 VITALS — BP 102/70 | HR 74 | Ht 64.0 in | Wt 228.0 lb

## 2022-11-02 DIAGNOSIS — H6122 Impacted cerumen, left ear: Secondary | ICD-10-CM

## 2022-11-02 DIAGNOSIS — R42 Dizziness and giddiness: Secondary | ICD-10-CM

## 2022-11-02 MED ORDER — AMOXICILLIN-POT CLAVULANATE 875-125 MG PO TABS: 1.0000 | ORAL_TABLET | Freq: Two times a day (BID) | ORAL | 0 refills | Status: AC

## 2022-11-02 MED ORDER — DEBROX 6.5 % OT SOLN: 5.0000 [drp] | Freq: Two times a day (BID) | OTIC | 0 refills | Status: AC

## 2022-11-02 NOTE — Patient Instructions (Signed)
It was wonderful to meet you today. Thank you for allowing me to be a part of your care. Below is a short summary of what we discussed at your visit today:  You have underlying upper respiratory viral illness which explains the headache runny nose and nasal congestion.  I think with blowing your nose really had to you blow your left eardrum and also flareup show one of your nasal vessels causing the nosebleed.  I have sent in prescription for Debrox eardrops which you will apply on the left ear to clear the ear and maybe this could improve the pressure you are feeling on the left ear.  Please bring all of your medications to every appointment!  If you have any questions or concerns, please do not hesitate to contact us via phone or MyChart message.   Alen Bleacher, MD [facility]

## 2022-11-02 NOTE — Progress Notes (Signed)
    SUBJECTIVE:   CHIEF COMPLAINT / HPI:   Patient is a 32 year old female presenting today for dizziness. She reports having an episode of dizziness, sharp left ear pain and nasal bleeding after blowing her nose. Reports feeling congested with running nose about 3 days.  She had the 1 episodes of nasal bleeding and dizziness yesterday. Endorses headache with the congestion but denies any chest pains or palpitation.  She feels she is overall well-hydrated and drinking adequately.  Dizziness is not affected by changing position or with standing. No fevers or chills or known sick contacts.  PERTINENT  PMH / PSH: Reviewed   OBJECTIVE:   BP 102/70   Pulse 74   Ht 5\' 4"  (1.626 m)   Wt 228 lb (103.4 kg)   LMP 10/17/2022   SpO2 99%   BMI 39.14 kg/m    Physical Exam General: Alert, well appearing, NAD HEENT: Normal right ear, Unable to visualize left TM due to clot mixed with ear wax. Left ear pain on exam Cardiovascular: RRR, No Murmurs, Normal S2/S2 Respiratory: CTAB, No wheezing or Rales Skin: Warm and dry  ASSESSMENT/PLAN:   Dizziness Patient's presentation and exam findings is suspicious of ongoing viral respiratory illness.  Her one-time episode of dizziness is likely vasovagal response with blowing her nose. She also likely had a ruptured ear drum given significant left ear pain on exam, reports of pressure in the left ear.  Truman Hayward unable to visualize the left TM due to blood and wax in the ear canal.  Given patient pain it will be difficult to mechanically disimpact the ear wax and clot in the left ear, will send in debrox to hopefully get some of the wax and clot out to visualize the TM at her follow up appointment. -Provided reassurance to patient for dizziness -Encourage adequate hydration -Rx debrox for the left ear to try and  -Rx Debrox -RX Augmentin for 5 days -Follow-up in 2 days to reevaluate left knee injury.     Alen Bleacher, MD [place]

## 2022-11-04 ENCOUNTER — Ambulatory Visit (INDEPENDENT_AMBULATORY_CARE_PROVIDER_SITE_OTHER): Payer: Medicaid Other | Admitting: Physician Assistant

## 2022-11-04 ENCOUNTER — Encounter (HOSPITAL_COMMUNITY): Payer: Self-pay | Admitting: Physician Assistant

## 2022-11-04 ENCOUNTER — Encounter (HOSPITAL_COMMUNITY): Payer: Medicaid Other | Admitting: Student in an Organized Health Care Education/Training Program

## 2022-11-04 ENCOUNTER — Encounter (HOSPITAL_COMMUNITY): Payer: Self-pay | Admitting: Student in an Organized Health Care Education/Training Program

## 2022-11-04 DIAGNOSIS — F411 Generalized anxiety disorder: Secondary | ICD-10-CM

## 2022-11-04 DIAGNOSIS — F17210 Nicotine dependence, cigarettes, uncomplicated: Secondary | ICD-10-CM | POA: Diagnosis not present

## 2022-11-04 DIAGNOSIS — F3181 Bipolar II disorder: Secondary | ICD-10-CM | POA: Diagnosis not present

## 2022-11-04 DIAGNOSIS — Z720 Tobacco use: Secondary | ICD-10-CM

## 2022-11-04 MED ORDER — QUETIAPINE FUMARATE 50 MG PO TABS: 50.0000 mg | ORAL_TABLET | Freq: Every day | ORAL | 1 refills | Status: AC

## 2022-11-04 MED ORDER — MELATONIN 5 MG PO TABS: 5.0000 mg | ORAL_TABLET | Freq: Every day | ORAL | 3 refills | Status: AC

## 2022-11-04 MED ORDER — HYDROXYZINE HCL 25 MG PO TABS: 25.0000 mg | ORAL_TABLET | Freq: Three times a day (TID) | ORAL | 3 refills | Status: AC

## 2022-11-04 NOTE — Progress Notes (Signed)
BH MD/PA/NP OP Progress Note  Virtual Visit via Video Note  I connected with Taylor Carson on 11/04/22 at  4:00 PM EST by a video enabled telemedicine application and verified that I am speaking with the correct person using two identifiers.  Location: Patient: Home Provider: Clinic   I discussed the limitations of evaluation and management by telemedicine and the availability of in person appointments. The patient expressed understanding and agreed to proceed.  Follow Up Instructions:  I discussed the assessment and treatment plan with the patient. The patient was provided an opportunity to ask questions and all were answered. The patient agreed with the plan and demonstrated an understanding of the instructions.   The patient was advised to call back or seek an in-person evaluation if the symptoms worsen or if the condition fails to improve as anticipated.  I provided 19 minutes of non-face-to-face time during this encounter.  Meta Hatchet, PA    11/04/2022 6:20 PM Taylor Carson  MRN:  [id number]  Chief Complaint:  Chief Complaint  Patient presents with   Follow-up   Medication Management   HPI:   Taylor Carson is a 32 year old, African-American female with a past psychiatric history significant for bipolar 2 disorder (major depressive episode), tobacco abuse, and generalized anxiety disorder who presents to [facility] via virtual video visit for follow-up and medication management.  Patient was last seen by Dr. Doyne Keel on 08/04/2022.  During her last encounter, patient was being managed on the following psychiatric medications:  Abilify 7.5 mg at bedtime Melatonin 5 mg at bedtime Nicotine polacrilex (Nicorette) 2 mg as needed Hydroxyzine 25 mg 3 times daily as needed  Patient reports that she has been having issues with her Abilify.  Patient states that even though she is taking her Abilify at night, she still  experiences waking up groggy and drowsy.  Patient states that even though she goes to work at 1 PM, she Taylor still find herself being tired before work.  Patient states that she takes her Abilify at 74 PM because it takes roughly an hour for the medication to kick in.  Patient states that she would also experience grogginess when taking the medication in the morning.  Patient reports that her mood has been good when taking her medications consistently; however, patient continues to experience depression.  Patient reports that when she went to see her primary care provider, she scored high on her depression screen.  Patient rates her depression at 9 out of 10 with 10 being most severe.  Patient depressive symptoms are characterized by the following: not coming out of the room, crying spells, and going days without showering.  Patient reports that her anxiety has calmed down a lot more and rates her anxiety a 4 or 5 out of 10.  A PHQ-9 screen was performed with the patient scoring a 16.  A GAD-7 screen was also performed with the patient scoring a 9.  Patient is alert and oriented x 4, calm, cooperative, and fully engaged in conversation during the encounter.  Patient reports that she did not want to come out today because of her mood.  Patient denies suicidal or homicidal ideations.  She further denies auditory or visual hallucinations and does not appear to be responding to internal/external stimuli.  Patient endorses fair sleep and receives on average 4 to 6 hours of sleep each night.  Patient states that her sleep is often characterized by waking up constantly.  Patient endorses decreased appetite and eats on average one meal and a snack per day.  Patient denies alcohol consumption, tobacco use, and illicit drug use.  Visit Diagnosis:    ICD-10-CM   1. Bipolar 2 disorder, major depressive episode (HCC)  F31.81 QUEtiapine (SEROQUEL) 50 MG tablet    melatonin 5 MG TABS    2. Tobacco abuse  Z72.0     3.  GAD (generalized anxiety disorder)  F41.1 QUEtiapine (SEROQUEL) 50 MG tablet    hydrOXYzine (ATARAX) 25 MG tablet    melatonin 5 MG TABS      Past Psychiatric History:  Bipolar 2 disorder (major depressive episode) Tobacco abuse Generalized anxiety disorder  Past Medical History:  Past Medical History:  Diagnosis Date   Anxiety    Depression    Dyspnea    11-01-2018 per pt "due to my thyroid"   History of concussion    8/ 2018  per pt residual resolved   History of ectopic pregnancy 03/2017   treated medically   Hypothyroidism    Multinodular thyroid 05/2017   incidental finding on CT imaging     Past Surgical History:  Procedure Laterality Date   CESAREAN SECTION N/A 06/24/2018   Procedure: CESAREAN SECTION;  Surgeon: Woodroe Mode, MD;  Location: [facility];  Service: Obstetrics;  Laterality: N/A;   THYROIDECTOMY N/A 11/08/2018   Procedure: TOTAL THYROIDECTOMY;  Surgeon: Armandina Gemma, MD;  Location: [facility];  Service: General;  Laterality: N/A;    Family Psychiatric History:  Patient denies  Family History:  Family History  Problem Relation Age of Onset   45 / [place] Mother    Cancer Maternal Grandfather    Diabetes Paternal Grandmother    Heart disease Paternal Grandmother     Social History:  Social History   Socioeconomic History   Marital status: Single    Spouse name: Not on file   Number of children: Not on file   Years of education: Not on file   Highest education level: Not on file  Occupational History   Not on file  Tobacco Use   Smoking status: Former    Packs/day: 0.25    Years: 4.00    Total pack years: 1.00    Types: Cigarettes    Quit date: 08/2022    Years since quitting: 0.2   Smokeless tobacco: Never  Vaping Use   Vaping Use: Never used  Substance and Sexual Activity   Alcohol use: No   Drug use: Not Currently    Frequency: 1.0 times per week    Types: Marijuana, Cocaine    Comment: 11-01-2018  per pt  last cocaine 2018, last Marijuana use 12/12/17   Sexual activity: Yes    Partners: Male    Birth control/protection: Injection    Comment: Depo injection  Other Topics Concern   Not on file  Social History Narrative   Not on file   Social Determinants of Health   Financial Resource Strain: Low Risk  (06/20/2018)   Overall Financial Resource Strain (CARDIA)    Difficulty of Paying Living Expenses: Not hard at all  Food Insecurity: Food Insecurity Present (07/28/2022)   Hunger Vital Sign    Worried About Running Out of Food in the Last Year: Often true    Ran Out of Food in the Last Year: Often true  Transportation Needs: Unmet Transportation Needs (07/28/2022)   PRAPARE - Hydrologist (Medical): No    Lack  of Transportation (Non-Medical): Yes  Physical Activity: Sufficiently Active (06/20/2018)   Exercise Vital Sign    Days of Exercise per Week: 7 days    Minutes of Exercise per Session: 30 min  Stress: No Stress Concern Present (06/22/2018)   [employer] of Occupational Health - Occupational Stress Questionnaire    Feeling of Stress : Not at all  Social Connections: Not on file    Allergies: No Known Allergies  Metabolic Disorder Labs: Lab Results  Component Value Date   HGBA1C 5.4 07/28/2022   MPG 108.28 07/28/2022   No results found for: "PROLACTIN" Lab Results  Component Value Date   CHOL 181 07/28/2022   TRIG 43 07/28/2022   HDL 53 07/28/2022   CHOLHDL 3.4 07/28/2022   VLDL 9 07/28/2022   LDLCALC 119 (H) 07/28/2022   Lab Results  Component Value Date   TSH 6.596 (H) 07/28/2022   TSH 2.150 01/07/2021    Therapeutic Level Labs: No results found for: "LITHIUM" No results found for: "VALPROATE" No results found for: "CBMZ"  Current Medications: Current Outpatient Medications  Medication Sig Dispense Refill   QUEtiapine (SEROQUEL) 50 MG tablet Take 1 tablet (50 mg total) by mouth at bedtime. 30 tablet 1    amoxicillin-clavulanate (AUGMENTIN) 875-125 MG tablet Take 1 tablet by mouth 2 (two) times daily for 5 days. 10 tablet 0   carbamide peroxide (DEBROX) 6.5 % OTIC solution Place 5 drops into the left ear 2 (two) times daily. 15 mL 0   hydrOXYzine (ATARAX) 25 MG tablet Take 1 tablet (25 mg total) by mouth 3 (three) times daily. 90 tablet 3   levothyroxine (SYNTHROID) 100 MCG tablet Take 1 tablet (100 mcg total) by mouth daily before breakfast. 90 tablet 1   melatonin 5 MG TABS Take 1 tablet (5 mg total) by mouth at bedtime. 30 tablet 3   nicotine (NICODERM CQ - DOSED IN MG/24 HOURS) 21 mg/24hr patch Place 1 patch (21 mg total) onto the skin daily at 6 (six) AM. 28 patch 0   nicotine polacrilex (NICORETTE) 2 MG gum Take 1 each (2 mg total) by mouth as needed for smoking cessation. 100 tablet 3   No current facility-administered medications for this visit.     Musculoskeletal: Strength & Muscle Tone: within normal limits Gait & Station: normal Patient leans: N/A  Psychiatric Specialty Exam: Review of Systems  Psychiatric/Behavioral:  Positive for sleep disturbance. Negative for decreased concentration, dysphoric mood, hallucinations, self-injury and suicidal ideas. The patient is not nervous/anxious and is not hyperactive.     Last menstrual period 10/17/2022, currently breastfeeding.There is no height or weight on file to calculate BMI.  General Appearance: Fairly Groomed  Eye Contact:  Good  Speech:  Clear and Coherent and Normal Rate  Volume:  Normal  Mood:  Depressed  Affect:  Congruent  Thought Process:  Coherent, Goal Directed, and Descriptions of Associations: Intact  Orientation:  Full (Time, Place, and Person)  Thought Content: WDL   Suicidal Thoughts:  No  Homicidal Thoughts:  No  Memory:  Immediate;   Good Recent;   Good Remote;   Good  Judgement:  Good  Insight:  Good  Psychomotor Activity:  Normal  Concentration:  Concentration: Good and Attention Span: Good  Recall:   Good  Fund of Knowledge: Good  Language: Good  Akathisia:  No  Handed:  Right  AIMS (if indicated): not done  Assets:  Communication Skills Desire for Improvement Financial Resources/Insurance Housing Leisure Time Physical Health  Social Support Transportation  ADL's:  Intact  Cognition: WNL  Sleep:  Fair   Screenings: AIMS    Flowsheet Row Admission (Discharged) from 07/28/2022 in [place] [facility]  AIMS Total Score 0      AUDIT    Flowsheet Row Admission (Discharged) from 07/28/2022 in [place] [facility]  Alcohol Use Disorder Identification Test Final Score (AUDIT) 0      GAD-7    Flowsheet Row Office Visit from 11/04/2022 in [facility] Most recent reading at 11/04/2022  4:16 PM Office Visit from 08/04/2022 in [facility] Most recent reading at 08/04/2022 11:56 AM Office Visit from 08/04/2022 in [place] Most recent reading at 08/04/2022 10:38 AM Routine Prenatal from 04/13/2018 in [place] Most recent reading at 04/13/2018 11:02 AM Routine Prenatal from 03/30/2018 in [place] Most recent reading at 03/30/2018 10:46 AM  Total GAD-7 Score 9 17 15 11 15       PHQ2-9    [place] Visit from 11/04/2022 in [facility] Most recent reading at 11/04/2022  4:13 PM Office Visit from 11/02/2022 in [place] Most recent reading at 11/02/2022 10:47 AM Office Visit from 10/08/2022 in [place] Most recent reading at 10/08/2022  2:51 PM Office Visit from 08/04/2022 in [facility] Most recent reading at 08/04/2022 11:51 AM Office Visit from 08/04/2022 in [place] Most recent reading at 08/04/2022 10:37 AM  PHQ-2 Total Score 5 4 3 1 1   PHQ-9 Total Score 16 12 8 10  8       [place] from 11/04/2022 in [facility] Office Visit from 08/04/2022 in [facility] Admission (Discharged) from 07/28/2022 in [place] [facility]  C-SSRS RISK CATEGORY Moderate Risk Error: Q7 should not be populated when Q6 is No High Risk        Assessment and Plan:   Taylor Carson is a 32 year old, African-American female with a past psychiatric history significant for bipolar 2 disorder (major depressive episode), tobacco abuse, and generalized anxiety disorder who presents to [facility] via virtual video visit for follow-up and medication management.  Patient presents today complaining of issues with her Abilify stating that the medication makes her groggy when taking the medication at night.  Patient also continues to endorse depression she rates a 9 out of 10 with 10 being most severe.  Patient states that when she went to see her last primary care provider, she scored relatively high on her depression screen.  Patient also endorses issues with sleep stating that she wakes up constantly at night.  Patient was recommended discontinuing Abilify and starting Seroquel 50 mg at bedtime for the management of her depressive symptoms and mood stability.  Patient was informed that Seroquel can cause drowsiness so therefore she should take at bedtime.  Patient vocalized understanding.  Patient's medications to be e-prescribed to pharmacy of choice.  Collaboration of Care: Collaboration of Care: Medication Management AEB provider managing patient's psychiatric medications, Primary Care Provider AEB patient being seen by a primary care provider, Psychiatrist AEB patient being seen by a mental health provider, and Referral or follow-up with counselor/therapist AEB patient being seen by a licensed clinical social worker at this facility  Patient/Guardian  was advised  Release of Information must be obtained prior to any record release in order to collaborate their care with an outside provider. Patient/Guardian was advised if they have not already done so to contact the registration department to sign all necessary forms in order for [place] to release information regarding their care.   Consent: Patient/Guardian gives verbal consent for treatment and assignment of benefits for services provided during this visit. Patient/Guardian expressed understanding and agreed to proceed.   1. Bipolar 2 disorder, major depressive episode (HCC)  - QUEtiapine (SEROQUEL) 50 MG tablet; Take 1 tablet (50 mg total) by mouth at bedtime.  Dispense: 30 tablet; Refill: 1 - melatonin 5 MG TABS; Take 1 tablet (5 mg total) by mouth at bedtime.  Dispense: 30 tablet; Refill: 3  2. Tobacco abuse   3. GAD (generalized anxiety disorder)  - QUEtiapine (SEROQUEL) 50 MG tablet; Take 1 tablet (50 mg total) by mouth at bedtime.  Dispense: 30 tablet; Refill: 1 - hydrOXYzine (ATARAX) 25 MG tablet; Take 1 tablet (25 mg total) by mouth 3 (three) times daily.  Dispense: 90 tablet; Refill: 3 - melatonin 5 MG TABS; Take 1 tablet (5 mg total) by mouth at bedtime.  Dispense: 30 tablet; Refill: 3  Patient to follow-up in 6 weeks Provider spent a total of 19 minutes with the patient/reviewing patient's chart  Meta Hatchet, PA 11/04/2022, 6:20 PM

## 2022-11-05 ENCOUNTER — Telehealth (HOSPITAL_COMMUNITY): Payer: Self-pay | Admitting: Mental Health

## 2022-11-05 ENCOUNTER — Ambulatory Visit (HOSPITAL_COMMUNITY): Payer: Medicaid Other | Admitting: Mental Health

## 2022-11-05 NOTE — Telephone Encounter (Signed)
Therapist sent link to connect to [facility] for intake [facility]. No response after x 15 minutes. Contacted on telephone x 2. Straight to VM. Left message to reschedule

## 2022-11-10 ENCOUNTER — Telehealth (HOSPITAL_COMMUNITY): Payer: Self-pay

## 2022-11-11 ENCOUNTER — Telehealth (HOSPITAL_COMMUNITY): Payer: Self-pay

## 2022-11-11 NOTE — Telephone Encounter (Signed)
Medication management - Prior authorization for patient's Seroquel 50 mg, one at bedtime, #30 for 30 days completed and approved by patient's insurance through until 11/11/23, Reference # [id number]. Pt eligible to fill next on 11/27/22 following recent fill claim.

## 2022-11-20 NOTE — Telephone Encounter (Signed)
Provider an unable to see message sent. Patient has an appointment scheduled with this provider on 12/18/2022.

## 2022-11-30 ENCOUNTER — Ambulatory Visit (INDEPENDENT_AMBULATORY_CARE_PROVIDER_SITE_OTHER): Payer: Medicaid Other | Admitting: Mental Health

## 2022-11-30 ENCOUNTER — Encounter (HOSPITAL_COMMUNITY): Payer: Self-pay

## 2022-11-30 DIAGNOSIS — F411 Generalized anxiety disorder: Secondary | ICD-10-CM

## 2022-11-30 DIAGNOSIS — F3181 Bipolar II disorder: Secondary | ICD-10-CM | POA: Diagnosis not present
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
## 2022-12-18 ENCOUNTER — Telehealth (HOSPITAL_COMMUNITY): Payer: Medicaid Other | Admitting: Physician Assistant

## 2022-12-18 ENCOUNTER — Encounter (HOSPITAL_COMMUNITY): Payer: Self-pay

## 2022-12-30 ENCOUNTER — Ambulatory Visit (HOSPITAL_COMMUNITY): Payer: Medicaid Other | Admitting: Mental Health

## 2023-01-12 ENCOUNTER — Ambulatory Visit (INDEPENDENT_AMBULATORY_CARE_PROVIDER_SITE_OTHER): Payer: Medicaid Other | Admitting: Family Medicine

## 2023-01-12 ENCOUNTER — Other Ambulatory Visit: Payer: Self-pay

## 2023-01-12 ENCOUNTER — Encounter: Payer: Self-pay | Admitting: Family Medicine

## 2023-01-12 ENCOUNTER — Other Ambulatory Visit (HOSPITAL_COMMUNITY)
Admission: RE | Admit: 2023-01-12 | Discharge: 2023-01-12 | Disposition: A | Discharge status: Home / Self Care | Payer: Medicaid Other | Source: Ambulatory Visit | Attending: Family Medicine | Admitting: Family Medicine

## 2023-01-12 VITALS — BP 111/76 | HR 77 | Ht 64.0 in | Wt 234.0 lb

## 2023-01-12 DIAGNOSIS — R110 Nausea: Secondary | ICD-10-CM

## 2023-01-12 DIAGNOSIS — N949 Unspecified condition associated with female genital organs and menstrual cycle: Secondary | ICD-10-CM

## 2023-01-12 DIAGNOSIS — Z3200 Encounter for pregnancy test, result unknown: Secondary | ICD-10-CM

## 2023-01-12 LAB — POCT URINE PREGNANCY: Preg Test, Ur: NEGATIVE

## 2023-01-12 MED ORDER — ONDANSETRON 4 MG PO TBDP
4.0000 mg | ORAL_TABLET | Freq: Three times a day (TID) | ORAL | 0 refills | Status: AC | PRN
  Filled 2023-01-12: qty 10, 4d supply, fill #0

## 2023-01-12 NOTE — Progress Notes (Unsigned)
    SUBJECTIVE:   CHIEF COMPLAINT / HPI:   Pain with intercourse -4-5 days ago noticed discomfort with intercourse, described as pain on the vaginal skin -tried intercourse again since then and had same pain -she does use lubricant -no vaginal discomfort outside of intercourse -no vaginal discharge, odor, or itching -no dysuria, some urinary frequency for 1 day but none since -no fever or back pain  Nausea Started 2-3 days ago Few episodes of vomiting as well Nonbloody No diarrhea, no significant abdominal pain No cough/congestion No headache No sick contacts that she's aware of Sometimes after eating, other times not Wonders if she's pregnant Does desire pregnancy  PERTINENT  PMH / PSH: hypothyroid, bipolar 2 disorder  OBJECTIVE:   BP 111/76   Pulse 77   Ht 5\' 4"  (1.626 m)   Wt 234 lb (106.1 kg)   LMP 12/23/2022   SpO2 100%   BMI 40.17 kg/m   Gen: NAD, pleasant, able to participate in exam CV: RRR, normal S1/S2, no murmur Resp: Normal effort, lungs CTAB GI: normoactive bowel sounds, abdomen soft, nontender, nondistended Extremities: no edema or cyanosis Skin: warm and dry, no rashes noted Neuro: alert, no obvious focal deficits Psych: Normal affect and mood GU/GYN: Exam performed in the presence of a chaperone. Small linear abrasion on right labia minora at vaginal introitus. Remainder of external genitalia within normal limits. Vaginal mucosa pink, moist, normal rugae.  Nonfriable cervix without lesions, no discharge or bleeding noted on speculum exam.  ASSESSMENT/PLAN:   Vaginal discomfort Most likely due to small linear abrasion on right labia minora which was noted on exam today. Likely from shaving. Advised she refrain from intercourse until this heals. Swabs for GC/chlamydia, trich, yeast and BV also obtained today. Will f/u results via [place].  Nausea Upreg negative. No red flags on history and physical exam is reassuring. Likely viral gastroenteritis.  Rx sent for Zofran ODT. Return if no improvement.    Alcus Dad, MD [place]

## 2023-01-12 NOTE — Patient Instructions (Addendum)
It was great to see you!  Things we discussed at today's visit: -You have a cut on your outer vagina. This is the likely cause of your pain. I would give it a few weeks to heal before attempting intercourse again. When you do have intercourse, use lubricant. -We are checking for vaginal infections today.  I will send you a MyChart message with the results. -For your nausea, the exact cause is unclear. It may be a virus. I have sent a few tablets of Zofran to use as needed. If your symptoms do not improve over the next 1-2 weeks please let us know.  Take care and seek immediate care sooner if you develop any concerns.  Dr. [facility]

## 2023-01-13 LAB — CERVICOVAGINAL ANCILLARY ONLY
Bacterial Vaginitis (gardnerella): NEGATIVE
Candida Glabrata: NEGATIVE
Candida Vaginitis: NEGATIVE
Chlamydia: NEGATIVE
Comment: NEGATIVE
Comment: NEGATIVE
Comment: NEGATIVE
Comment: NEGATIVE
Comment: NEGATIVE
Comment: NORMAL
Neisseria Gonorrhea: NEGATIVE
Trichomonas: NEGATIVE

## 2023-01-14 DIAGNOSIS — N949 Unspecified condition associated with female genital organs and menstrual cycle: Secondary | ICD-10-CM | POA: Insufficient documentation

## 2023-01-14 DIAGNOSIS — R110 Nausea: Secondary | ICD-10-CM | POA: Insufficient documentation

## 2023-01-14 NOTE — Assessment & Plan Note (Signed)
Most likely due to small linear abrasion on right labia minora which was noted on exam today. Likely from shaving. Advised she refrain from intercourse until this heals. Swabs for GC/chlamydia, trich, yeast and BV also obtained today. Will f/u results via [place].

## 2023-01-14 NOTE — Assessment & Plan Note (Signed)
Upreg negative. No red flags on history and physical exam is reassuring. Likely viral gastroenteritis. Rx sent for Zofran ODT. Return if no improvement.

## 2023-01-18 ENCOUNTER — Other Ambulatory Visit: Payer: Self-pay

## 2023-02-08 ENCOUNTER — Ambulatory Visit (INDEPENDENT_AMBULATORY_CARE_PROVIDER_SITE_OTHER): Payer: Medicaid Other | Admitting: Family Medicine

## 2023-02-08 ENCOUNTER — Ambulatory Visit
Admission: RE | Admit: 2023-02-08 | Discharge: 2023-02-08 | Disposition: A | Discharge status: Home / Self Care | Payer: Medicaid Other | Source: Ambulatory Visit | Attending: Family Medicine | Admitting: Family Medicine

## 2023-02-08 VITALS — BP 111/78 | HR 73 | Temp 98.2°F | Ht 64.0 in | Wt 236.0 lb

## 2023-02-08 DIAGNOSIS — M25552 Pain in left hip: Secondary | ICD-10-CM

## 2023-02-08 MED ORDER — MELOXICAM 7.5 MG PO TABS: 7.5000 mg | ORAL_TABLET | Freq: Every day | ORAL | 0 refills | Status: AC

## 2023-02-08 NOTE — Progress Notes (Signed)
    SUBJECTIVE:   CHIEF COMPLAINT / HPI:   Patient presents with left hip pain that started 3 days ago. Friday night she was out dancing, she was in a squatting position and as she was attempting to stand up, she felt a pop with the pain. She thought it would be better with resting and woke up Saturday morning with continued pain. Has tried ibuprofen, tylenol and excedrin which failed to provide relief. Attempted to stretch the area which improved. Pain sometimes radiates from the left lower back, to left hip to down her left thigh. Denies any falls. Relieving factors include stretching. Aggravating factors include certain movements and sometimes walking. Denies groin paresthesia.   OBJECTIVE:   BP 111/78   Pulse 73   Temp 98.2 F (36.8 C)   Ht 5\' 4"  (1.626 m)   Wt 236 lb (107 kg)   LMP 01/19/2023   SpO2 100%   BMI 40.51 kg/m   General: Patient well-appearing, in no acute distress. Resp: normal work of breathing noted MSK: no paraspinal tenderness, no tissue texture changes noted, FABER testing on the left elicits pain, positive left straight leg raise  Neuro: 5/5 right LE strength, 4/5 left LE strength although unable to accurately assess strength due to pain, gross sensation intact, 2+ patellar and achilles reflexes bilaterally, slightly antalgic gait  ASSESSMENT/PLAN:   Left hip pain -likely due to recent injury causing strain with underlying sciatica.  -will obtain x-ray to assess for any osseous abnormality, no indication for MRI at this time as patient remains neurovascularly intact -short course of meloxicam 7.5 mg prescribed to reduce pain and inflammation to allow patient to participating in stretching -handout of stretching exercises provided, encouraged gradual increase in mobility and stretching -follow up based on imaging, will touch base with patient to determine next steps in management once imaging has completed    Wilver Tignor Robyne Peers, DO [facility]

## 2023-02-08 NOTE — Assessment & Plan Note (Signed)
-  likely due to recent injury causing strain with underlying sciatica.  -will obtain x-ray to assess for any osseous abnormality, no indication for MRI at this time as patient remains neurovascularly intact -short course of meloxicam 7.5 mg prescribed to reduce pain and inflammation to allow patient to participating in stretching -handout of stretching exercises provided, encouraged gradual increase in mobility and stretching -follow up based on imaging, will touch base with patient to determine next steps in management once imaging has completed

## 2023-02-08 NOTE — Patient Instructions (Signed)
It was great seeing you today!  Today we discussed your hip ain, we will get imaging of your hip. I have prescribed meloxicam for a few days, you can take this for the pain and to calm any inflammation. I have also attached stretching exercises to help you feel better, if any stretch causes pain then please do not do that particular exercise. I will be in touch with the results and next steps.   Please follow up at your next scheduled appointment, if anything arises between now and then, please don't hesitate to contact our office.   Thank you for allowing [place] to be a part of your medical care!  Thank you, Dr. Robyne Peers

## 2023-03-05 ENCOUNTER — Emergency Department (HOSPITAL_COMMUNITY)
Admission: EM | Admit: 2023-03-05 | Discharge: 2023-03-05 | Discharge status: Against Medical Advice | Payer: Medicaid Other | Attending: Emergency Medicine | Admitting: Emergency Medicine

## 2023-03-05 ENCOUNTER — Other Ambulatory Visit: Payer: Self-pay

## 2023-03-05 DIAGNOSIS — M25552 Pain in left hip: Secondary | ICD-10-CM | POA: Insufficient documentation

## 2023-03-05 DIAGNOSIS — Z5321 Procedure and treatment not carried out due to patient leaving prior to being seen by health care provider: Secondary | ICD-10-CM | POA: Diagnosis not present

## 2023-03-05 DIAGNOSIS — W1840XA Slipping, tripping and stumbling without falling, unspecified, initial encounter: Secondary | ICD-10-CM | POA: Insufficient documentation

## 2023-03-05 NOTE — ED Triage Notes (Addendum)
Pt c/o L hip and groin pain from slipping on ice less than an hr ago. L hip had previous injury.  AOx4

## 2023-08-17 ENCOUNTER — Other Ambulatory Visit: Payer: Self-pay | Admitting: Family Medicine

## 2023-10-22 ENCOUNTER — Ambulatory Visit: Payer: Medicaid Other | Admitting: Family Medicine

## 2023-10-22 VITALS — BP 113/95 | HR 68 | Ht 64.0 in | Wt 248.8 lb

## 2023-10-22 DIAGNOSIS — M546 Pain in thoracic spine: Secondary | ICD-10-CM | POA: Diagnosis not present

## 2023-10-22 NOTE — Progress Notes (Signed)
   SUBJECTIVE:   CHIEF COMPLAINT / HPI: back pain  Pain underneath shoulder blade, R>L. Better with yoga. Worse with cough. Ongoing for the past few months but progressively worse over the last week. Describes as spasm, especially when coughs. Located in center of back, radiates across back and down arms. Better with stretching. No new or different activities. Works sedentary job at call center. Recently drinking more apple cider vinegar and lemon to try to lose weight. Taking excedrin or ibuprofen  with relief. Denies chest pain, N/V, difficulties breathing.   OBJECTIVE:   BP (!) 113/95   Pulse 68   Ht 5' 4 (1.626 m)   Wt 248 lb 12.8 oz (112.9 kg)   SpO2 100%   BMI 42.71 kg/m   Gen: well appearing, in NAD Card: RRR Lungs: CTAB MSK: no midline spinal tenderness. Full AROM in flexion, extension, of b/l arms. TTP over bilateral trapezius muscles. Muscular knot felt under R shoulder blade, TTP. 5/5 UE strength including grip. No muscular atrophy. Ext: WWP, no edema   ASSESSMENT/PLAN:   Thoracic back pain Likely MSK, spasm. Unclear trigger, likely positional through work though improving with OTC antiinflammatory and stretching. Muscle energy OMT performed with subsequent partial relaxation of b/l trapezius and R sided latissimus. Discussed further treatment options, elects for home therapy/stretching. May benefit from dry needling/trigger point if remains symptomatic after good home effort. Handout provided.      Donald CHRISTELLA Lai, DO

## 2023-10-22 NOTE — Patient Instructions (Signed)
 It was great to see you!  Our plans for today:  - Try the stretches as we talked about.  - Continue to take ibuprofen  as needed. - Come back if no better in a couple weeks.  Take care and seek immediate care sooner if you develop any concerns.   Dr. Veeda Virgo

## 2023-10-22 NOTE — Assessment & Plan Note (Signed)
 Likely MSK, spasm. Unclear trigger, likely positional through work though improving with OTC antiinflammatory and stretching. Muscle energy OMT performed with subsequent partial relaxation of b/l trapezius and R sided latissimus. Discussed further treatment options, elects for home therapy/stretching. May benefit from dry needling/trigger point if remains symptomatic after good home effort. Handout provided.

## 2023-10-25 ENCOUNTER — Ambulatory Visit: Payer: Medicaid Other | Admitting: Family Medicine

## 2023-10-29 ENCOUNTER — Telehealth: Payer: Self-pay

## 2023-10-29 NOTE — Telephone Encounter (Signed)
Received VM from pharmacy regarding patient's levothyroxine prescription.   He reports that there has been a manufacturer change to [employer] and needs approval for change.   Please let me know if this is okay and I can call the pharmacy and let them know. Thanks.   Veronda Prude, RN

## 2023-11-03 NOTE — Telephone Encounter (Signed)
Called pharmacy and provided with verbal approval.   Called patient and scheduled for follow up with Dr. Jennette Kettle on 12/01/23.  Veronda Prude, RN

## 2023-12-01 ENCOUNTER — Ambulatory Visit: Payer: Medicaid Other | Admitting: Family Medicine

## 2023-12-03 ENCOUNTER — Other Ambulatory Visit: Payer: Self-pay

## 2023-12-03 NOTE — Telephone Encounter (Signed)
Patient requests a 30 day fill of Levothyroxine .  She reports she had to reschedule her apt due to weather.   She is rescheduled to see PCP on 3/12.

## 2023-12-05 MED ORDER — LEVOTHYROXINE SODIUM 100 MCG PO TABS: 100.0000 ug | ORAL_TABLET | Freq: Every day | ORAL | 3 refills | Status: AC

## 2023-12-22 ENCOUNTER — Ambulatory Visit: Payer: Medicaid Other | Admitting: Family Medicine

## 2024-02-11 DIAGNOSIS — Z79890 Hormone replacement therapy (postmenopausal): Secondary | ICD-10-CM | POA: Diagnosis not present

## 2024-02-11 DIAGNOSIS — M79605 Pain in left leg: Secondary | ICD-10-CM | POA: Diagnosis not present

## 2024-02-11 DIAGNOSIS — E0590 Thyrotoxicosis, unspecified without thyrotoxic crisis or storm: Secondary | ICD-10-CM | POA: Diagnosis not present

## 2024-02-11 DIAGNOSIS — M7989 Other specified soft tissue disorders: Secondary | ICD-10-CM | POA: Diagnosis not present

## 2024-02-11 DIAGNOSIS — F17210 Nicotine dependence, cigarettes, uncomplicated: Secondary | ICD-10-CM | POA: Diagnosis not present

## 2024-02-11 DIAGNOSIS — M79652 Pain in left thigh: Secondary | ICD-10-CM | POA: Diagnosis not present

## 2024-02-11 DIAGNOSIS — R600 Localized edema: Secondary | ICD-10-CM | POA: Diagnosis not present

## 2024-02-11 DIAGNOSIS — M791 Myalgia, unspecified site: Secondary | ICD-10-CM | POA: Diagnosis not present

## 2024-02-11 DIAGNOSIS — M25572 Pain in left ankle and joints of left foot: Secondary | ICD-10-CM | POA: Diagnosis not present

## 2024-02-11 DIAGNOSIS — M25472 Effusion, left ankle: Secondary | ICD-10-CM | POA: Diagnosis not present

## 2024-02-21 DIAGNOSIS — Z720 Tobacco use: Secondary | ICD-10-CM | POA: Diagnosis not present

## 2024-02-21 DIAGNOSIS — E039 Hypothyroidism, unspecified: Secondary | ICD-10-CM | POA: Diagnosis not present

## 2024-05-19 DIAGNOSIS — N898 Other specified noninflammatory disorders of vagina: Secondary | ICD-10-CM | POA: Diagnosis not present

## 2024-05-19 DIAGNOSIS — N760 Acute vaginitis: Secondary | ICD-10-CM | POA: Diagnosis not present

## 2024-05-19 DIAGNOSIS — A5901 Trichomonal vulvovaginitis: Secondary | ICD-10-CM | POA: Diagnosis not present
